# Patient Record
Sex: Male | Born: 1950 | Race: Black or African American | Hispanic: No | Marital: Married | Smoking: Former smoker
Health system: Southern US, Community
[De-identification: ages and names within clinical notes are randomized; demographics above are authoritative.]

## PROBLEM LIST (undated history)

## (undated) DIAGNOSIS — I2510 Atherosclerotic heart disease of native coronary artery without angina pectoris: Secondary | ICD-10-CM

## (undated) DIAGNOSIS — E785 Hyperlipidemia, unspecified: Secondary | ICD-10-CM

## (undated) DIAGNOSIS — G4733 Obstructive sleep apnea (adult) (pediatric): Secondary | ICD-10-CM

## (undated) DIAGNOSIS — I429 Cardiomyopathy, unspecified: Secondary | ICD-10-CM

## (undated) DIAGNOSIS — E119 Type 2 diabetes mellitus without complications: Secondary | ICD-10-CM

## (undated) DIAGNOSIS — N529 Male erectile dysfunction, unspecified: Secondary | ICD-10-CM

## (undated) DIAGNOSIS — N184 Chronic kidney disease, stage 4 (severe): Secondary | ICD-10-CM

## (undated) DIAGNOSIS — I272 Pulmonary hypertension, unspecified: Secondary | ICD-10-CM

## (undated) DIAGNOSIS — M109 Gout, unspecified: Secondary | ICD-10-CM

## (undated) DIAGNOSIS — R0689 Other abnormalities of breathing: Secondary | ICD-10-CM

## (undated) DIAGNOSIS — I10 Essential (primary) hypertension: Secondary | ICD-10-CM

## (undated) HISTORY — DX: Male erectile dysfunction, unspecified: N52.9

## (undated) HISTORY — PX: HAND SURGERY: SHX662

---

## 1999-09-05 ENCOUNTER — Inpatient Hospital Stay (HOSPITAL_COMMUNITY): Admission: EM | Admit: 1999-09-05 | Discharge: 1999-09-09 | Payer: Self-pay | Admitting: Cardiology

## 2003-08-17 ENCOUNTER — Ambulatory Visit (HOSPITAL_COMMUNITY): Admission: RE | Admit: 2003-08-17 | Discharge: 2003-08-18 | Payer: Self-pay | Admitting: Internal Medicine

## 2003-08-17 ENCOUNTER — Encounter: Payer: Self-pay | Admitting: Internal Medicine

## 2004-05-01 ENCOUNTER — Ambulatory Visit: Payer: Self-pay | Admitting: Endocrinology

## 2004-05-13 ENCOUNTER — Ambulatory Visit: Payer: Self-pay | Admitting: Endocrinology

## 2004-09-10 ENCOUNTER — Ambulatory Visit: Payer: Self-pay | Admitting: Endocrinology

## 2004-09-24 ENCOUNTER — Ambulatory Visit: Payer: Self-pay | Admitting: Endocrinology

## 2004-11-24 ENCOUNTER — Ambulatory Visit: Payer: Self-pay | Admitting: Endocrinology

## 2004-12-30 ENCOUNTER — Ambulatory Visit: Payer: Self-pay | Admitting: Endocrinology

## 2005-02-10 ENCOUNTER — Ambulatory Visit: Payer: Self-pay | Admitting: Endocrinology

## 2005-05-13 ENCOUNTER — Ambulatory Visit: Payer: Self-pay | Admitting: Endocrinology

## 2005-06-17 ENCOUNTER — Ambulatory Visit: Payer: Self-pay | Admitting: Endocrinology

## 2005-09-15 ENCOUNTER — Ambulatory Visit: Payer: Self-pay | Admitting: Endocrinology

## 2005-10-15 ENCOUNTER — Ambulatory Visit: Payer: Self-pay | Admitting: Endocrinology

## 2005-12-29 ENCOUNTER — Ambulatory Visit: Payer: Self-pay | Admitting: Endocrinology

## 2006-01-06 ENCOUNTER — Ambulatory Visit: Payer: Self-pay | Admitting: Endocrinology

## 2006-04-08 ENCOUNTER — Ambulatory Visit: Payer: Self-pay | Admitting: Endocrinology

## 2006-07-15 ENCOUNTER — Ambulatory Visit: Payer: Self-pay | Admitting: Endocrinology

## 2006-08-19 ENCOUNTER — Ambulatory Visit (HOSPITAL_COMMUNITY): Admission: RE | Admit: 2006-08-19 | Discharge: 2006-08-19 | Payer: Self-pay | Admitting: Ophthalmology

## 2006-10-20 ENCOUNTER — Ambulatory Visit: Payer: Self-pay | Admitting: Endocrinology

## 2006-12-30 ENCOUNTER — Encounter: Payer: Self-pay | Admitting: Endocrinology

## 2006-12-30 DIAGNOSIS — I10 Essential (primary) hypertension: Secondary | ICD-10-CM

## 2006-12-30 DIAGNOSIS — I2510 Atherosclerotic heart disease of native coronary artery without angina pectoris: Secondary | ICD-10-CM

## 2007-01-12 ENCOUNTER — Ambulatory Visit: Payer: Self-pay | Admitting: Endocrinology

## 2007-02-18 ENCOUNTER — Ambulatory Visit: Payer: Self-pay | Admitting: Cardiology

## 2007-05-10 ENCOUNTER — Encounter: Payer: Self-pay | Admitting: Endocrinology

## 2007-05-12 ENCOUNTER — Ambulatory Visit: Payer: Self-pay | Admitting: Endocrinology

## 2007-05-26 ENCOUNTER — Encounter: Payer: Self-pay | Admitting: Endocrinology

## 2007-08-02 ENCOUNTER — Encounter: Payer: Self-pay | Admitting: Endocrinology

## 2007-09-08 ENCOUNTER — Encounter: Payer: Self-pay | Admitting: Endocrinology

## 2007-09-20 ENCOUNTER — Ambulatory Visit: Payer: Self-pay | Admitting: Endocrinology

## 2007-10-20 ENCOUNTER — Ambulatory Visit: Payer: Self-pay | Admitting: Endocrinology

## 2008-01-02 ENCOUNTER — Ambulatory Visit: Payer: Self-pay | Admitting: Endocrinology

## 2008-01-02 LAB — CONVERTED CEMR LAB: Hgb A1c MFr Bld: 8.6 % — ABNORMAL HIGH (ref 4.6–6.0)

## 2008-04-02 ENCOUNTER — Ambulatory Visit: Payer: Self-pay | Admitting: Endocrinology

## 2008-04-02 LAB — CONVERTED CEMR LAB: Hgb A1c MFr Bld: 8.8 % — ABNORMAL HIGH (ref 4.6–6.0)

## 2008-07-30 ENCOUNTER — Ambulatory Visit: Payer: Self-pay | Admitting: Endocrinology

## 2008-07-30 DIAGNOSIS — F17200 Nicotine dependence, unspecified, uncomplicated: Secondary | ICD-10-CM | POA: Insufficient documentation

## 2008-07-30 DIAGNOSIS — M109 Gout, unspecified: Secondary | ICD-10-CM

## 2008-07-30 DIAGNOSIS — E7800 Pure hypercholesterolemia, unspecified: Secondary | ICD-10-CM

## 2008-08-17 ENCOUNTER — Encounter: Payer: Self-pay | Admitting: Endocrinology

## 2008-09-05 ENCOUNTER — Ambulatory Visit: Payer: Self-pay | Admitting: Endocrinology

## 2008-09-05 DIAGNOSIS — I6789 Other cerebrovascular disease: Secondary | ICD-10-CM

## 2008-09-05 LAB — CONVERTED CEMR LAB: Hgb A1c MFr Bld: 9.6 % — ABNORMAL HIGH (ref 4.6–6.5)

## 2008-10-10 ENCOUNTER — Ambulatory Visit (HOSPITAL_COMMUNITY): Admission: RE | Admit: 2008-10-10 | Discharge: 2008-10-10 | Payer: Self-pay | Admitting: Family Medicine

## 2008-12-10 ENCOUNTER — Ambulatory Visit: Payer: Self-pay | Admitting: Endocrinology

## 2008-12-18 ENCOUNTER — Telehealth (INDEPENDENT_AMBULATORY_CARE_PROVIDER_SITE_OTHER): Payer: Self-pay | Admitting: *Deleted

## 2009-01-04 ENCOUNTER — Ambulatory Visit: Payer: Self-pay | Admitting: Endocrinology

## 2009-02-04 ENCOUNTER — Ambulatory Visit: Payer: Self-pay | Admitting: Endocrinology

## 2009-02-12 ENCOUNTER — Encounter: Payer: Self-pay | Admitting: Endocrinology

## 2009-05-14 ENCOUNTER — Ambulatory Visit: Payer: Self-pay | Admitting: Endocrinology

## 2009-06-12 ENCOUNTER — Encounter: Payer: Self-pay | Admitting: Endocrinology

## 2009-08-13 ENCOUNTER — Ambulatory Visit: Payer: Self-pay | Admitting: Endocrinology

## 2009-08-13 LAB — CONVERTED CEMR LAB: Hgb A1c MFr Bld: 10.1 % — ABNORMAL HIGH (ref 4.6–6.5)

## 2009-08-20 ENCOUNTER — Telehealth (INDEPENDENT_AMBULATORY_CARE_PROVIDER_SITE_OTHER): Payer: Self-pay | Admitting: *Deleted

## 2009-10-22 ENCOUNTER — Encounter: Payer: Self-pay | Admitting: Endocrinology

## 2009-10-28 ENCOUNTER — Telehealth (INDEPENDENT_AMBULATORY_CARE_PROVIDER_SITE_OTHER): Payer: Self-pay | Admitting: *Deleted

## 2009-11-11 ENCOUNTER — Ambulatory Visit: Payer: Self-pay | Admitting: Endocrinology

## 2009-12-11 ENCOUNTER — Ambulatory Visit: Payer: Self-pay | Admitting: Endocrinology

## 2009-12-11 LAB — CONVERTED CEMR LAB: Hgb A1c MFr Bld: 7.6 % — ABNORMAL HIGH (ref 4.6–6.5)

## 2009-12-19 ENCOUNTER — Emergency Department (HOSPITAL_COMMUNITY): Admission: EM | Admit: 2009-12-19 | Discharge: 2009-12-19 | Payer: Self-pay | Admitting: Emergency Medicine

## 2010-02-18 ENCOUNTER — Encounter: Payer: Self-pay | Admitting: Endocrinology

## 2010-03-21 ENCOUNTER — Ambulatory Visit: Payer: Self-pay | Admitting: Endocrinology

## 2010-07-22 NOTE — Assessment & Plan Note (Signed)
Summary: 3 MO ROV /NWS   Vital Signs:  Patient profile:   60 year old male Height:      71 inches (180.34 cm) Weight:      239 pounds (108.64 kg) BMI:     33.45 O2 Sat:      91 % on Room air Temp:     98.3 degrees F (36.83 degrees C) oral Pulse rate:   78 / minute BP sitting:   136 / 76  (left arm) Cuff size:   large  Vitals Entered By: Rebeca Alert MA (March 21, 2010 9:28 AM)  O2 Flow:  Room air CC: 3 month F/U/aj Is Patient Diabetic? Yes   Primary [place] Clos:  Dr Quintin Alto  CC:  3 month F/U/aj.  History of Present Illness: pt states he feels well in general, except for a few aches and pains.  no cbg record, but states cbg's vary from 80-185, with no trend throughout the day.  Current Medications (verified): 1)  Metformin Hcl 850 Mg  Tabs (Metformin Hcl) .... Take 1 By Mouth Bid 2)  Enalapril Maleate 20 Mg  Tabs (Enalapril Maleate) .... Tale 1 By Mouth Qd 3)  Hydrochlorothiazide 25 Mg  Tabs (Hydrochlorothiazide) .... Take 1/2 Tab By Mouth Qd 4)  Alprazolam 0.25 Mg  Tabs (Alprazolam) .... Take 1-2 By Mouth Once Daily Prn 5)  Simvastatin 20 Mg  Tabs (Simvastatin) .... Take 1 By Mouth Qhs 6)  Plavix 75 Mg  Tabs (Clopidogrel Bisulfate) .... Take 1 By Mouth Qd 7)  Lopressor 50 Mg  Tabs (Metoprolol Tartrate) .... Take 1 By Mouth Two Times A Day Qd 8)  Levemir 100 Unit/ml Soln (Insulin Detemir) .... 75 Units Qam and 45 Units Pm, and Synringes Two Times A Day 9)  Aspirin 325 Mg Tabs (Aspirin) .Marland Kitchen.. 1 Tab Two Times A Day 10)  Toprol Xl 50 Mg Xr24h-Tab (Metoprolol Succinate) .Marland Kitchen.. 1 Tablet By Mouth Once Daily  Allergies (verified): No Known Drug Allergies  Past History:  Past Medical History: Last updated: 09/05/2008 ED DM Neuropathy CEREBROVASCULAR ACCIDENT, ACUTE (ICD-436) SMOKER (ICD-305.1) HYPERCHOLESTEROLEMIA (ICD-272.0) GOUT (ICD-274.9) CORONARY ARTERY DISEASE (ICD-414.00) HYPERTENSION (ICD-401.9) DIABETES MELLITUS, TYPE II (ICD-250.00)  Review of Systems  The  patient denies syncope.    Physical Exam  General:  normal appearance.   Pulses:  dorsalis pedis intact bilat.    Extremities:  no deformity.  no ulcer on the feet.  feet are of normal color and temp.  no edema  Neurologic:  sensation is intact to touch on the feet. Additional Exam:  outside test results are reviewed:  a1c=7.6   Impression & Recommendations:  Problem # 1:  DIABETES MELLITUS, TYPE II (ICD-250.00) this is the best control this pt should aim for, given this regimen, which does match insulin to his changing needs throughout the day  Medications Added to Medication List This Visit: 1)  Toprol Xl 50 Mg Xr24h-tab (Metoprolol succinate) .Marland Kitchen.. 1 tablet by mouth once daily  Other Orders: Est. Patient Level III (76283)  Patient Instructions: 1)  please continue levemir 75 units am and 45 units pm.  2)  Please schedule a follow-up appointment in 4 months. 3)  good diet and exercise habits significanly improve the control of your diabetes.  please let me know if you wish to be referred to a dietician.  high blood sugar is very risky to your health.  you should see an eye doctor every year. 4)  controlling your blood pressure and cholesterol drastically reduces the  damage diabetes does to your body.  this also applies to quitting smoking.  please discuss these with your doctor.  you should take an aspirin every day, unless you have been advised by a doctor not to.

## 2010-07-22 NOTE — Progress Notes (Signed)
  Recieved request from [facility] Forwarded to [facility] for processing [facility]  August 20, 2009 9:32 AM

## 2010-07-22 NOTE — Assessment & Plan Note (Signed)
Summary: 1 mth fu--stc   Vital Signs:  Patient profile:   60 year old male Height:      71 inches (180.34 cm) Weight:      234.4 pounds (106.55 kg) O2 Sat:      92 % on Room air Temp:     97.4 degrees F (36.33 degrees C) oral Pulse rate:   68 / minute BP sitting:   132 / 82  (left arm) Cuff size:   large  Vitals Entered By: Tomma Lightning (December 11, 2009 10:05 AM)  O2 Flow:  Room air CC: 1 month follow-up Is Patient Diabetic? Yes Did you bring your meter with you today? No Pain Assessment Patient in pain? no        Primary Provider:  Dr Quintin Alto  CC:  1 month follow-up.  History of Present Illness: he brings a record of his cbg's which i have reviewed today.  it varies from 61 (before lunch or before supper) to 200 (before breakfast).  pt states he feels well in general.  he takes levemir 80 units am and 40 units pm.    Current Medications (verified): 1)  Metformin Hcl 850 Mg  Tabs (Metformin Hcl) .... Take 1 By Mouth Bid 2)  Enalapril Maleate 20 Mg  Tabs (Enalapril Maleate) .... Tale 1 By Mouth Qd 3)  Hydrochlorothiazide 25 Mg  Tabs (Hydrochlorothiazide) .... Take 1/2 Tab By Mouth Qd 4)  Alprazolam 0.25 Mg  Tabs (Alprazolam) .... Take 1-2 By Mouth Once Daily Prn 5)  Simvastatin 20 Mg  Tabs (Simvastatin) .... Take 1 By Mouth Qhs 6)  Plavix 75 Mg  Tabs (Clopidogrel Bisulfate) .... Take 1 By Mouth Qd 7)  Lopressor 50 Mg  Tabs (Metoprolol Tartrate) .... Take 1 By Mouth Two Times A Day Qd 8)  Levemir 100 Unit/ml Soln (Insulin Detemir) .... 75 Units Qam and 45 Units Pm, and Synringes Two Times A Day 9)  Aspirin 325 Mg Tabs (Aspirin) .Marland Kitchen.. 1 Tab Two Times A Day  Allergies (verified): No Known Drug Allergies  Past History:  Past Medical History: Last updated: 09/05/2008 ED DM Neuropathy CEREBROVASCULAR ACCIDENT, ACUTE (ICD-436) SMOKER (ICD-305.1) HYPERCHOLESTEROLEMIA (ICD-272.0) GOUT (ICD-274.9) CORONARY ARTERY DISEASE (ICD-414.00) HYPERTENSION (ICD-401.9) DIABETES  MELLITUS, TYPE II (ICD-250.00)  Review of Systems  The patient denies syncope.    Physical Exam  General:  obese.  no distress  Skin:  insulin injection sites at anterior abdomen are normal  Additional Exam:  Hemoglobin A1C       [H]  7.6 %    Impression & Recommendations:  Problem # 1:  DIABETES MELLITUS, TYPE II (ICD-250.00) improved  Other Orders: TLB-A1C / Hgb A1C (Glycohemoglobin) (83036-A1C) Est. Patient Level III (49675)  Patient Instructions: 1)  tests are being ordered for you today.  a few days after the test(s), please call [phone] to hear your test results. 2)  pending the test results, please change levemir to 75 units am and 45 units pm.  3)  Please schedule a follow-up appointment in 3 months. 4)  (update: i left message on phone-tree:  rx as we discussed.  call if hypoglycemia persists).

## 2010-07-22 NOTE — Letter (Signed)
Summary: [facility]   Imported By: Phillis Knack 06/27/2009 07:41:24  _____________________________________________________________________  External Attachment:    Type:   Image     Comment:   External Document

## 2010-07-22 NOTE — Assessment & Plan Note (Signed)
Summary: 3 MTH FU  STC   Vital Signs:  Patient profile:   60 year old male Height:      71 inches (180.34 cm) Weight:      237.38 pounds (107.90 kg) BMI:     33.23 O2 Sat:      84 % on Room air Temp:     97.4 degrees F (36.33 degrees C) oral Pulse rate:   78 / minute BP sitting:   138 / 80  (left arm) Cuff size:   large  Vitals Entered By: Gardenia Phlegm RMA (Nov 11, 2009 10:33 AM)  O2 Flow:  Room air CC: 3 month follow up/  OX 94 on 2nd retake/ CF Is Patient Diabetic? Yes   Primary Provider:  Dr Quintin Alto  CC:  3 month follow up/  OX 94 on 2nd retake/ CF.  History of Present Illness: pt states he feels well in general.  he brings a record of his cbg's which i have reviewed today.  i received a note from dr sasser that his a1c was high, so i called pt to increase his levemir.  he was advised to increase to 90 units am and 40 units pm.  however, he says he just then increased to 80 am and 40 pm (which is the dosage i thought he was on already).  cbg's are much better since then.  it varies from 60's (before lunch) to high-100's (am).   pt says it has been for many years that his 02 sat drops with ambulation.  Current Medications (verified): 1)  Metformin Hcl 850 Mg  Tabs (Metformin Hcl) .... Take 1 By Mouth Bid 2)  Enalapril Maleate 20 Mg  Tabs (Enalapril Maleate) .... Tale 1 By Mouth Qd 3)  Hydrochlorothiazide 25 Mg  Tabs (Hydrochlorothiazide) .... Take 1/2 Tab By Mouth Qd 4)  Alprazolam 0.25 Mg  Tabs (Alprazolam) .... Take 1-2 By Mouth Once Daily Prn 5)  Simvastatin 20 Mg  Tabs (Simvastatin) .... Take 1 By Mouth Qhs 6)  Plavix 75 Mg  Tabs (Clopidogrel Bisulfate) .... Take 1 By Mouth Qd 7)  Lopressor 50 Mg  Tabs (Metoprolol Tartrate) .... Take 1 By Mouth Two Times A Day Qd 8)  Insulin Pen Needles, and Size and Brand .... Tid 9)  Levemir 100 Unit/ml Soln (Insulin Detemir) .... 80 Units Qam and 40 Units Pm 10)  Aspirin 325 Mg Tabs (Aspirin) .Marland Kitchen.. 1 Tab Two Times A Day  Allergies  (verified): No Known Drug Allergies  Past History:  Past Medical History: Last updated: 09/05/2008 ED DM Neuropathy CEREBROVASCULAR ACCIDENT, ACUTE (ICD-436) SMOKER (ICD-305.1) HYPERCHOLESTEROLEMIA (ICD-272.0) GOUT (ICD-274.9) CORONARY ARTERY DISEASE (ICD-414.00) HYPERTENSION (ICD-401.9) DIABETES MELLITUS, TYPE II (ICD-250.00)  Review of Systems  The patient denies syncope.    Physical Exam  General:  morbidly obese.  no distress  Lungs:  Clear to auscultation bilaterally. Normal respiratory effort.  Skin:  insulin injection sites at anterior abdomen are normal    Impression & Recommendations:  Problem # 1:  DIABETES MELLITUS, TYPE II (ICD-250.00) needs increased rx  Problem # 2:  hypoxia uncertain etiology  Medications Added to Medication List This Visit: 1)  Levemir 100 Unit/ml Soln (Insulin detemir) .... 75 units qam and 45 units pm, and synringes two times a day  Other Orders: Est. Patient Level III (66294)  Patient Instructions: 1)  tests are being ordered for you today.  a few days after the test(s), please call [phone] to hear your test results. 2)  pending  the test results, please continue the same insulin for now. 3)  Please schedule a follow-up appointment in 1 month. 4)  change levemir to 75 units am and 45 units pm.  5)  please see dr Quintin Alto to see why your oxygen goes low when you walk.

## 2010-07-22 NOTE — Progress Notes (Signed)
  Phone Note Outgoing Call   Summary of Call: Left a message for pt to return my call. MD wants pt to increase levimir to 90 units in AM and 40 units pm. return as scheduled. Initial call taken by: Gardenia Phlegm RMA,  Oct 28, 2009 11:43 AM  Follow-up for Phone Call        Informed pt Follow-up by: Gardenia Phlegm RMA,  Oct 28, 2009 2:15 PM

## 2010-07-22 NOTE — Assessment & Plan Note (Signed)
Summary: 3 MTH FU  STC   Vital Signs:  Patient profile:   60 year old male Height:      71 inches (180.34 cm) Weight:      243 pounds (110.45 kg) O2 Sat:      90 % on Room air Temp:     97.1 degrees F (36.17 degrees C) oral Pulse rate:   79 / minute BP sitting:   142 / 82  (left arm) Cuff size:   large  Vitals Entered By: Gardenia Phlegm RMA (August 13, 2009 9:50 AM)  O2 Flow:  Room air CC: 3 month follow up/ Pt states every morning his BS is in the 200's/ CF Is Patient Diabetic? Yes   Primary Provider:  Dr Quintin Alto  CC:  3 month follow up/ Pt states every morning his BS is in the 200's/ CF.  History of Present Illness: pt states he feels well in general.  he brings a record of his cbg's which i have reviewed today.  it varies from 60-250.  it is highest in am, and lowest before lunch, and in the afternoon.  Current Medications (verified): 1)  Metformin Hcl 850 Mg  Tabs (Metformin Hcl) .... Take 1 By Mouth Bid 2)  Enalapril Maleate 20 Mg  Tabs (Enalapril Maleate) .... Tale 1 By Mouth Qd 3)  Hydrochlorothiazide 25 Mg  Tabs (Hydrochlorothiazide) .... Take 1/2 Tab By Mouth Qd 4)  Alprazolam 0.25 Mg  Tabs (Alprazolam) .... Take 1-2 By Mouth Once Daily Prn 5)  Simvastatin 20 Mg  Tabs (Simvastatin) .... Take 1 By Mouth Qhs 6)  Plavix 75 Mg  Tabs (Clopidogrel Bisulfate) .... Take 1 By Mouth Qd 7)  Lopressor 50 Mg  Tabs (Metoprolol Tartrate) .... Take 1 By Mouth Two Times A Day Qd 8)  Insulin Pen Needles, and Size and Brand .... Tid 9)  Levemir 100 Unit/ml Soln (Insulin Detemir) .... 80 Units Qam and 30 Units Pm 10)  Aspirin 325 Mg Tabs (Aspirin) .Marland Kitchen.. 1 Tab Two Times A Day  Allergies (verified): No Known Drug Allergies  Past History:  Past Medical History: Last updated: 09/05/2008 ED DM Neuropathy CEREBROVASCULAR ACCIDENT, ACUTE (ICD-436) SMOKER (ICD-305.1) HYPERCHOLESTEROLEMIA (ICD-272.0) GOUT (ICD-274.9) CORONARY ARTERY DISEASE (ICD-414.00) HYPERTENSION  (ICD-401.9) DIABETES MELLITUS, TYPE II (ICD-250.00)  Review of Systems  The patient denies syncope.    Physical Exam  General:  obese.  no distress  Pulses:  dorsalis pedis intact bilat.    Extremities:  no deformity.  no ulcer on the feet.  feet are of normal color and temp.  no edema  Neurologic:  sensation is intact to touch on the feet  Additional Exam:  (a1c was recently 9.3 by dr sasser).  today: Hemoglobin A1C       [H]  10.1 %   Impression & Recommendations:  Problem # 1:  DIABETES MELLITUS, TYPE II (ICD-250.00) therapy limited by pt's need for a simple regimen.  he may do better with a simpler (once daily) regimen  Medications Added to Medication List This Visit: 1)  Levemir 100 Unit/ml Soln (Insulin detemir) .... 80 units qam and 40 units pm  Other Orders: TLB-A1C / Hgb A1C (Glycohemoglobin) (83036-A1C) Est. Patient Level III (09735)  Patient Instructions: 1)  tests are being ordered for you today.  a few days after the test(s), please call [phone] to hear your test results. 2)  pending the test results, please continue the same insulin for now. 3)  Please schedule a follow-up appointment in 3  months. 4)  please cosider weight loss surgery.  i am happy to refer you if you wish. 5)  (update: i left message on phone-tree:  increase levemir to 80 units am and 40 units pm.  please consider lantus).

## 2010-07-22 NOTE — Letter (Signed)
Summary: [facility]   Imported By: Phillis Knack 10/30/2009 13:17:25  _____________________________________________________________________  External Attachment:    Type:   Image     Comment:   External Document

## 2010-07-23 ENCOUNTER — Ambulatory Visit: Admit: 2010-07-23 | Payer: Self-pay | Admitting: Endocrinology

## 2010-07-23 ENCOUNTER — Ambulatory Visit: Payer: Self-pay | Admitting: Endocrinology

## 2010-08-04 ENCOUNTER — Encounter: Payer: Self-pay | Admitting: Endocrinology

## 2010-08-04 ENCOUNTER — Ambulatory Visit (INDEPENDENT_AMBULATORY_CARE_PROVIDER_SITE_OTHER): Payer: Managed Care, Other (non HMO) | Admitting: Endocrinology

## 2010-08-04 ENCOUNTER — Other Ambulatory Visit: Payer: Managed Care, Other (non HMO)

## 2010-08-04 ENCOUNTER — Encounter (INDEPENDENT_AMBULATORY_CARE_PROVIDER_SITE_OTHER): Payer: Self-pay | Admitting: *Deleted

## 2010-08-04 ENCOUNTER — Other Ambulatory Visit: Payer: Self-pay | Admitting: Endocrinology

## 2010-08-04 DIAGNOSIS — E119 Type 2 diabetes mellitus without complications: Secondary | ICD-10-CM

## 2010-08-04 LAB — HEMOGLOBIN A1C: Hgb A1c MFr Bld: 9.8 % — ABNORMAL HIGH (ref 4.6–6.5)

## 2010-08-13 NOTE — Assessment & Plan Note (Signed)
Summary: 4 MO ROV/NWS #   Vital Signs:  Patient profile:   60 year old male Height:      71 inches (180.34 cm) Weight:      240.50 pounds (109.32 kg) BMI:     33.66 O2 Sat:      87 % on Room air Temp:     98.6 degrees F (37.00 degrees C) oral Pulse rate:   86 / minute Pulse rhythm:   regular BP sitting:   128 / 76  (left arm) Cuff size:   large  Vitals Entered By: Rebeca Alert CMA Deborra Medina) (August 04, 2010 10:17 AM)  O2 Flow:  Room air CC: 4 month F/U/CBG's have been high in the mornings/aj Is Patient Diabetic? Yes Comments pt has never had a colonoscopy and pt declines flu shot   Primary Provider:  Dr Quintin Alto  CC:  4 month F/U/CBG's have been high in the mornings/aj.  History of Present Illness: no cbg record, but states cbg's are well-controlled, except it is in the 200's in am.  it sometimes is a slow as 70 in the afternoon.    Current Medications (verified): 1)  Metformin Hcl 850 Mg  Tabs (Metformin Hcl) .... Take 1 By Mouth Bid 2)  Enalapril Maleate 20 Mg  Tabs (Enalapril Maleate) .... Tale 1 By Mouth Qd 3)  Hydrochlorothiazide 25 Mg  Tabs (Hydrochlorothiazide) .... Take 1/2 Tab By Mouth Qd 4)  Alprazolam 0.25 Mg  Tabs (Alprazolam) .... Take 1-2 By Mouth Once Daily Prn 5)  Simvastatin 20 Mg  Tabs (Simvastatin) .... Take 1 By Mouth Qhs 6)  Plavix 75 Mg  Tabs (Clopidogrel Bisulfate) .... Take 1 By Mouth Qd 7)  Lopressor 50 Mg  Tabs (Metoprolol Tartrate) .... Take 1 By Mouth Two Times A Day Qd 8)  Levemir 100 Unit/ml Soln (Insulin Detemir) .... 80 Units Qam and 40 Units Pm, and Synringes Two Times A Day 9)  Aspirin 325 Mg Tabs (Aspirin) .Marland Kitchen.. 1 Tab Two Times A Day 10)  Toprol Xl 50 Mg Xr24h-Tab (Metoprolol Succinate) .Marland Kitchen.. 1 Tablet By Mouth Once Daily  Allergies (verified): No Known Drug Allergies  Past History:  Past Medical History: Last updated: 09/05/2008 ED DM Neuropathy CEREBROVASCULAR ACCIDENT, ACUTE (ICD-436) SMOKER (ICD-305.1) HYPERCHOLESTEROLEMIA  (ICD-272.0) GOUT (ICD-274.9) CORONARY ARTERY DISEASE (ICD-414.00) HYPERTENSION (ICD-401.9) DIABETES MELLITUS, TYPE II (ICD-250.00)  Family History: Reviewed history and no changes required. dm: father  Social History: Reviewed history and no changes required. married disabled.    Review of Systems  The patient denies syncope.    Physical Exam  General:  normal appearance.   Neck:  Supple without thyroid enlargement or tenderness.  Additional Exam:  Hemoglobin A1C       [H]  9.8 %      Impression & Recommendations:  Problem # 1:  DIABETES MELLITUS, TYPE II (ICD-250.00) Assessment Deteriorated needs increased rx  Medications Added to Medication List This Visit: 1)  Levemir 100 Unit/ml Soln (Insulin detemir) .... 80 units qam and 40 units pm, and synringes two times a day 2)  Levemir 100 Unit/ml Soln (Insulin detemir) .... 70 units qam and 60 units pm, and synringes two times a day  Other Orders: TLB-A1C / Hgb A1C (Glycohemoglobin) (83036-A1C) Est. Patient Level III (40981)  Patient Instructions: 1)  blood tests are being ordered for you today.  please call [phone] to hear your test results.   2)  pending the test results, please change levemir to 70 units each am, and 60  units each evening.   3)  Please schedule a follow-up appointment in 6 weeks.   4)  (update: i left message on phone-tree:  rx as we discussed)   Orders Added: 1)  TLB-A1C / Hgb A1C (Glycohemoglobin) [83036-A1C] 2)  Est. Patient Level III [03014]   Immunization History:  Tetanus/Td Immunization History:    Tetanus/Td:  historical (06/22/2008)   Immunization History:  Tetanus/Td Immunization History:    Tetanus/Td:  Historical (06/22/2008)

## 2010-09-15 ENCOUNTER — Ambulatory Visit (INDEPENDENT_AMBULATORY_CARE_PROVIDER_SITE_OTHER): Payer: Managed Care, Other (non HMO) | Admitting: Endocrinology

## 2010-09-15 ENCOUNTER — Encounter: Payer: Self-pay | Admitting: Endocrinology

## 2010-09-15 VITALS — BP 112/68 | HR 73 | Temp 98.1°F | Ht 71.0 in | Wt 243.4 lb

## 2010-09-15 DIAGNOSIS — E119 Type 2 diabetes mellitus without complications: Secondary | ICD-10-CM

## 2010-09-15 MED ORDER — INSULIN DETEMIR 100 UNIT/ML ~~LOC~~ SOLN: 80.0000 [IU] | Freq: Two times a day (BID) | SUBCUTANEOUS | Status: DC

## 2010-09-15 NOTE — Progress Notes (Signed)
  Subjective:    Patient ID: Gabriel Alvarez, male    DOB: May 25, 1951, 60 y.o.   MRN: [id number]  HPI Pt return for f/u of dm.  He says he has frequent mild hypoglycemia before lunch, if he is active.  he brings a record of his cbg's which i have reviewed today.  It varies from 60's (afternoon), to mostly high-100's (am).  Past Medical History  Diagnosis Date  . ED (erectile dysfunction)   . DM neuropathy, type II diabetes mellitus   . DIABETES MELLITUS, TYPE II 12/30/2006  . HYPERCHOLESTEROLEMIA 07/30/2008  . GOUT 07/30/2008  . HYPERTENSION 12/30/2006  . CORONARY ARTERY DISEASE 12/30/2006  . CEREBROVASCULAR ACCIDENT, ACUTE 09/05/2008   No past surgical history on file.  reports that he has been smoking Cigarettes.  He has a 30 pack-year smoking history. He does not have any smokeless tobacco history on file. He reports that he does not drink alcohol or use illicit drugs. family history includes Diabetes in his father. No Known Allergies Review of Systems Denies loc    Objective:   Physical Exam Pulses: dorsalis pedis intact bilat.   Feet: no deformity.  no ulcer on the feet.  feet are of normal color and temp.  no edema Neuro: sensation is intact to touch on the feet          Assessment & Plan:  Dm, needs increased rx

## 2010-09-15 NOTE — Patient Instructions (Addendum)
Take levemir 80 units 2x a day.  However, if you are going to be active during the day, take just 70 units that morning. please return here in 6 weeks.

## 2010-10-27 ENCOUNTER — Ambulatory Visit: Payer: Managed Care, Other (non HMO) | Admitting: Endocrinology

## 2010-10-30 ENCOUNTER — Encounter: Payer: Self-pay | Admitting: Endocrinology

## 2010-10-30 ENCOUNTER — Ambulatory Visit (INDEPENDENT_AMBULATORY_CARE_PROVIDER_SITE_OTHER): Payer: Managed Care, Other (non HMO) | Admitting: Endocrinology

## 2010-10-30 DIAGNOSIS — E119 Type 2 diabetes mellitus without complications: Secondary | ICD-10-CM

## 2010-10-30 MED ORDER — INSULIN GLARGINE 100 UNIT/ML ~~LOC~~ SOLN: 150.0000 [IU] | Freq: Every day | SUBCUTANEOUS | Status: DC

## 2010-10-30 NOTE — Progress Notes (Signed)
  Subjective:    Patient ID: Gabriel Alvarez, male    DOB: 28-Dec-1950, 60 y.o.   MRN: [id number]  HPI pt states he feels well in general.  He says he never misses insulin injections.  he brings a record of his cbg's which i have reviewed today.  It varies from 61-247, but most are in the 100's.  It is lowest before lunch, and in the afternoon.  He says numbness of the feet is much better recently. Past Medical History  Diagnosis Date  . ED (erectile dysfunction)   . DM neuropathy, type II diabetes mellitus   . DIABETES MELLITUS, TYPE II 12/30/2006  . HYPERCHOLESTEROLEMIA 07/30/2008  . GOUT 07/30/2008  . HYPERTENSION 12/30/2006  . CORONARY ARTERY DISEASE 12/30/2006  . CEREBROVASCULAR ACCIDENT, ACUTE 09/05/2008    No past surgical history on file.  History   Social History  . Marital Status: Married    Spouse Name: N/A    Number of Children: N/A  . Years of Education: N/A   Occupational History  .      disabled   Social History Main Topics  . Smoking status: Current Everyday Smoker -- 1.0 packs/day for 30 years    Types: Cigarettes  . Smokeless tobacco: Not on file  . Alcohol Use: No  . Drug Use: No  . Sexually Active: Not on file   Other Topics Concern  . Not on file   Social History Narrative  . No narrative on file    Current Outpatient Prescriptions on File Prior to Visit  Medication Sig Dispense Refill  . aspirin 325 MG tablet Take 325 mg by mouth 2 (two) times daily.        . clopidogrel (PLAVIX) 75 MG tablet Take 75 mg by mouth daily.        . enalapril (VASOTEC) 20 MG tablet Take 20 mg by mouth daily.        . hydrochlorothiazide 25 MG tablet 1 tablet by mouth once daily      . metFORMIN (GLUCOPHAGE) 850 MG tablet Take 850 mg by mouth 2 (two) times daily with a meal.        . metoprolol (TOPROL-XL) 50 MG 24 hr tablet Take 50 mg by mouth daily.        . metoprolol (LOPRESSOR) 50 MG tablet Take 50 mg by mouth 2 (two) times daily.          No Known Allergies  Family  History  Problem Relation Age of Onset  . Diabetes Father     BP 128/80  Pulse 70  Temp(Src) 98.6 F (37 C) (Oral)  Ht _0  (1.803 m)  Wt 247 lb 6.4 oz (112.22 kg)  BMI 34.51 kg/m2  SpO2 89%    Review of Systems Denies loc.    Objective:   Physical Exam     Pt says a1c was 9.8, last week.   Assessment & Plan:  Dm.  It seems worth while to try a different type of insulin schedule.

## 2010-10-30 NOTE — Patient Instructions (Signed)
Change levemir to lantus 150 units each morning. Please make a follow-up appointment in 6 weeks.

## 2010-11-04 NOTE — Consult Note (Signed)
[facility]                          ENDOCRINOLOGY CONSULTATION   Gabriel Alvarez, Gabriel Alvarez                        MRN:          [id number]  DATE:01/12/2007                            DOB:          1950/10/02    REASON FOR VISIT:  Follow up diabetes.   HISTORY OF THE PRESENT ILLNESS:  A 60 year old man who currently takes  Lantus 45 units twice a day, because he appeared to need a simpler  regimen.  He is having frequent hypoglycemia before lunch (about 3 times  a week).  I advised him to consider bariatric surgery, but he states he  investigated this and found his insurance would not pay for it.   PAST MEDICAL HISTORY:  1. Diabetes nephropathy.  2. Hypertension.  3. ED.  4. Dyslipidemia.   REVIEW OF SYSTEMS:  Denies loss of consciousness.   PHYSICAL EXAMINATION:  Blood pressure 145/77, heart rate is 82,  temperature is 97.3, the weight is 263.  GENERAL:  No distress.  FEET:  Normal color and temperature.  There is no ulcer present on the  feet.  Dorsalis pedis pulses are intact bilaterally and there is no  edema.  Sensation is intact to touch on the feet.   LABORATORY STUDIES FORWARDED BY DR. Quintin Alto:  Hemoglobin A1c 8.6.   IMPRESSION:  Given his elevated A1c, he his frequent hypoglycemia, and  his need for a simple insulin schedule, I think this probably is the  best control this patient can achieve.   PLAN:  1. Because of the hypoglycemia, I have advised him to reduce his      Lantus to 42 units twice a day.  2. Return in about 4 months.  3. Please make an appointment with Dr. Quintin Alto as you may need more      blood pressure medication.     Sean A. Loanne Drilling, MD  Electronically Signed    SAE/MedQ  DD: 01/14/2007  DT: 01/16/2007  Job #: [id number]   cc:   Gabriel Alvarez

## 2010-11-05 ENCOUNTER — Encounter: Payer: Self-pay | Admitting: Endocrinology

## 2010-11-07 NOTE — Consult Note (Signed)
[facility]                            ENDOCRINOLOGY CONSULTATION   ZENITH, LAMPHIER                        MRN:          [id number]  DATE:12/30/2005                            DOB:          January 14, 1951    REASON FOR VISIT:  Follow up diabetes.   HISTORY OF PRESENT ILLNESS:  A 60 year old man who states he is sometimes  missing his insulin injections. He states his glucoses are extremely  variable. He describes his exercise regimen as good. He describes his diet  as fair.   PAST MEDICAL HISTORY:  1.  Dyslipidemia.  2.  Hypertension.  3.  Diabetes nephropathy.   REVIEW OF SYSTEMS:  He has occasional mild hypoglycemia, at any time of day.   PHYSICAL EXAMINATION:  VITAL SIGNS:  Blood pressure 147/84, heart rate 67,  temperature 97.5. The weight is 258.  FEET:  Normal color and temperature. There is no ulcer present on the feet.  Dorsalis pedis pulses are intact bilaterally. Sensation is intact to touch.  There is no edema.   LABORATORY DATA:  On December 10, 2005, hemoglobin A1c 9.9.   IMPRESSION:  The insulin regimen that I have prescribed for him appears to  be too complex for him.   PLAN:  1.  Change to Humalog 75/25 50 units q.a.m. and 20 units q.p.m.  2.  Return next week.                                   Sean A. Loanne Drilling, MD   Early  DD:  12/30/2005  DT:  12/30/2005  Job #:  [id number]   cc:   Consuello Masse

## 2010-11-07 NOTE — Consult Note (Signed)
[facility]                          ENDOCRINOLOGY CONSULTATION   CADE, DASHNER                        MRN:          [id number]  DATE:10/20/2006                            DOB:          Aug 11, 1950    REASON FOR VISIT:  Follow up diabetes.   HISTORY OF PRESENT ILLNESS:  A 60 year old man who is currently taking  Lantus 75 units every morning.  His glucoses in the morning run 150 to  250 and then in the afternoon, they run 77-150.  He states that he feels  better in general.  Patient is also discouraged about his chronic weight  gain.   PAST MEDICAL HISTORY:  He has never been in a special weight loss  program.  I prescribed him Xenical several years ago, but he did not  take it because it was so expensive.   REVIEW OF SYSTEMS:  Denies hypoglycemia.   PHYSICAL EXAMINATION:  VITAL SIGNS:  Blood pressure 159/77, heart rate  65, temperature 97.2.  The weight is 255.  The height is 5 feet 11  inches.  GENERAL:  No distress.  SKIN:  Insulin injection sites at the anterior abdomen are normal.   LABORATORY STUDIES:  On October 20, 2006, hemoglobin A1C 8.7.   IMPRESSION:  1. He needs an increase in his insulin.  2. Chronic weight gain.   PLAN:  1. Change Lantus to 45 units twice daily.  2. Return in three months.  3. Refer to a bariatric surgeon at his request.     Sean A. Loanne Drilling, MD  Electronically Signed    SAE/MedQ  DD: 10/20/2006  DT: 10/21/2006  Job #: [id number]   cc:   Consuello Masse

## 2010-11-07 NOTE — Cardiovascular Report (Signed)
NAME:  Gabriel Alvarez, Gabriel Alvarez                           ACCOUNT NO.:  [id number]   MEDICAL RECORD NO.:  [id number]                   PATIENT TYPE:  OIB   LOCATION:  5725                                 FACILITY:  [facility]   PHYSICIAN:  Junious Silk, M.D. [facility]         DATE OF BIRTH:  1950/07/20   DATE OF PROCEDURE:  08/17/2003  DATE OF DISCHARGE:  08/18/2003                              CARDIAC CATHETERIZATION   PROCEDURE PERFORMED:  Left heart catheterization with coronary angiography  and left ventriculography.   INDICATION:  Mr. Barmore is a 60 year old male with history of coronary artery  disease and previous stent to the mid right coronary artery.  His last  catheterization performed in 2001 revealed a diffuse 50% in-stent restenosis  in the mid right coronary artery.  He was managed medically at that time. He  presented today to the office in [place] with symptoms of progressive  chest pain over the past week.  He was therefore referred for cardiac  catheterization.   CATHETERIZATION PROCEDURAL NOTE:  A 6 French sheath was placed in the right  femoral artery.  Coronary angiography was performed with 6 [place] JL-4 and  JR-4 catheters.  Intracoronary nitroglycerin was administered in the right  coronary artery to relieve coronary vasospasm.  Contrast was Omnipaque.  There were no complications.   CATHETERIZATION RESULTS:   HEMODYNAMICS:  1. Left ventricular pressure 130/12.  2. Aortic pressure 120/74.  3. There is no aortic valve gradient.   LEFT VENTRICULOGRAM:  Wall motion is normal.  Ejection fraction estimated at  60%.  There is no mitral regurgitation.   CORONARY ARTERIOGRAPHY (CO-DOMINANT):  Left main is normal.   Left anterior descending artery has a tubular 30% stenosis in the mid  vessel.  The distal LAD has a discrete 40% stenosis.  The LAD gives rise to  a normal size first diagonal branch and a small second diagonal branch.  There is a 50% stenosis in the ostium of  the first diagonal branch.   Left circumflex is a large co-dominant vessel.  The circumflex gives rise to  a large first marginal branch, a small second marginal branch and two normal  size posterior lateral branches.  The first obtuse marginal branch has a 40%  stenosis proximally and a 70% stenosis in the very distal portion of this  branch.   Right coronary artery is a co-dominant vessel.  There is a 30% stenosis in  the proximal vessel.  In the mid to distal vessel, there is a long stent  which has a diffuse 50% in-stent restenosis.  This is unchanged compared to  previous catheterization.  There is an acute marginal branch which arises  from within the stented portion of the right coronary artery.  There is an  80% stenosis in the ostium of this acute marginal branch.  The distal right  coronary artery ends as a normal size posterior descending artery.  There is  a 50% stenosis in the proximal portion of the posterior descending artery.   IMPRESSION:  1. Normal left ventricular systolic function.  2. Moderate one-vessel coronary artery disease characterized by diffuse 50%     in-stent restenosis which does not appear to be flow-limiting.     Furthermore, this is not significant to change in comparison to previous     catheterization from 2001.   PLAN:  The patient will be managed medically.  He will be started on a  proton pump inhibitor in addition to his other medications to treat  potential gastroesophageal reflux disease.                                               Junious Silk, M.D. [facility]    MWP/MEDQ  D:  08/17/2003  T:  08/19/2003  Job:  [id number]   cc:   Consuello Masse  [place] [id number]  Fax: [phone]   [facility] in [place]

## 2010-11-07 NOTE — Cardiovascular Report (Signed)
[facility]  Patient:    Gabriel Alvarez, Gabriel Alvarez                        MRN: [id number] Proc. Date: 09/08/99 Adm. Date:  [id number] Attending:  Ernestine Mcmurray CC:         Marijo Conception. Wall, M.D. [facility]             Consuello Masse, M.D.                        Cardiac Catheterization  PROCEDURE:  Coronary arteriography.  INDICATIONS:  Recurrent chest pain with previous stent to the distal right coronary artery.  FINDINGS: 1. LEFT MAIN CORONARY ARTERY:  Normal. 2. LEFT ANTERIOR DESCENDING CORONARY ARTERY:  Had 20% multiple discrete lesions in    the proximal and mid portion.  The distal vessel was small and diffusely    diseased.  The first diagonal branch had 20% multiple discrete lesions.  The  second diagonal branch was extremely small and diffusely diseased. 3. CIRCUMFLEX CORONARY ARTERY:  Large.  There were 20% multiple discrete lesions in    the proximal and mid portions.  The first obtuse marginal branch had 20%    multiple discrete lesions.  The distal vessel was of normal-sized disease. 4. RIGHT CORONARY ARTERY:  A medium-sized vessel with 30% eccentric stenosis    proximally.  The distal right coronary artery, at the site of the previous    stent, had a 50% tubular in-stent restenosis; however, there was a large    bifurcating RV branch coming off the middle of this stent, with a 90% ostial  lesion.  The distal vessel had 40% multiple, discrete lesions.  RAO VENTRICULOGRAPHY:  Revealed inferobasilar wall hypokinesis.  The ejection fraction was in the 50% range.  There was no gradient across the aortic valve and no MR.  HEMODYNAMICS: 1. Aortic Pressure:  120/81. 2. Left ventricular pressure:  120/14.  IMPRESSION:  Films will be reviewed with Dr. Vicenta Aly.  The in-stent restenosis and the native right coronary artery itself would no warrant intervention; however, there is a large RV branch whose ostium is jeopardized.  If we were to work  on he stent in the right coronary artery, I suspect we would lose the RV branch.  The angle of takeoff of the RV branch is extremely acute, and I am not sure it an be angioplastied through the stent.  Decision versus medical therapy versus angioplasty will be made after review with Dr. Vicenta Aly.DD:  09/08/99 TD:  09/08/99 Job: [id number]

## 2010-11-07 NOTE — Consult Note (Signed)
[facility]                          ENDOCRINOLOGY CONSULTATION   LEUL, NARRAMORE                        MRN:          [id number]  DATE:07/15/2006                            DOB:          01/26/1951    REASON FOR VISIT:  Followup diabetes.   HISTORY OF THE PRESENT ILLNESS:  A 60 year old man who states that  contrary to the last time he was here in October, his glucoses are in  the low 100s in the morning and in the high 100s in the afternoon.  He  states he is feeling well in general.   PAST MEDICAL HISTORY:  Same as April 08, 2006.   REVIEW OF SYSTEMS:  Denies hypoglycemia.   PHYSICAL EXAMINATION:  VITAL SIGNS:  Blood pressure 141/76, heart rate  72, temperature is 97.5, the weight is 255.  GENERAL:  No distress.  FEET:  Normal color and temperature.  There is no ulcer present on the  feet.  Sensation is intact to touch and dorsalis pedis pulses are intact  bilaterally.   IMPRESSION:  My sense is that he needs a simpler regimen still.  I had  prescribed him the premixed insulin to try to give him more insulin  during the day, but right now this does not seem to be working out  either.   PLAN:  1. Change to Lantus 65 units a day.  2. Return in 3 months.     Sean A. Loanne Drilling, MD  Electronically Signed    SAE/MedQ  DD: 07/16/2006  DT: 07/16/2006  Job #: [id number]   cc:   Consuello Masse

## 2010-11-07 NOTE — Consult Note (Signed)
[facility]                            ENDOCRINOLOGY CONSULTATION   Gabriel Alvarez, Gabriel Alvarez                        MRN:          [id number]  DATE:04/08/2006                            DOB:          [birth year]    REASON FOR VISIT:  Followup diabetes.   A 60 year old man who is on a relatively simple regimen of 75/25 insulin  twice a day. This is because he was really not able to keep up with a more  complex regimen. On his current schedule, he states that his glucoses are  much much better but continue to be higher in the morning than in the  afternoon.   PAST MEDICAL HISTORY:  1. Dyslipidemia.  2. ED.  3. Hypertension.  4. Diabetes nephropathy.  5. CAD.   REVIEW OF SYSTEMS:  He has occasional mild hypoglycemia if his lunch is  delayed. He has also lost a few pounds recently.   PHYSICAL EXAMINATION:  VITAL SIGNS:  Blood pressure 153/82, heart rate 56,  temperature is 97.5, the weight is 249.  GENERAL:  No distress.   Insulin injection sites at the anterior abdomen are normal.   IMPRESSION:  He seems to be doing much better on this simpler schedule of  insulin, but he needs some minor adjustments.   PLAN:  1. Change Humalog 75/25 insulin to 35 units before breakfast and 25 units      before lunch.  2. Return in 3 months.  3. You should have your hemoglobin A1c checked also. The patient wonders      if it is okay for      him to have this checked at his appointment with Dr. Quintin Alto. I have      told him the his is okay for patients we have in common.            ______________________________  Jacelyn Pi Loanne Drilling, MD   SAE/MedQ DD:  04/10/2006 DT:  04/12/2006 Job #:  [id number]   cc:   Consuello Masse

## 2010-11-07 NOTE — Discharge Summary (Signed)
[facility] [place]  Patient:    Gabriel Alvarez, Gabriel Alvarez                          MRN: [id number] Adm. Date:  09/05/99 Disc. Date: 09/09/99 Attending:  Marijo Conception. Wall, M.D. [facility] Dictator:   Ermalinda Barrios, P.A.C. CC:         Consuello Masse, M.D.             Thomas C. Wall, M.D. [facility]                  Referring Physician Discharge [facility]  ADMITTING DIAGNOSIS:  Chest pain.  DISCHARGE DIAGNOSES: 1. Chest pain.  Cardiac catheterization revealed a 95% RV branch, inferior    hypokinesis, ejection fraction 50%.  Patient refused follow-up stress    Cardiolite. 2. History of myocardial infarction and percutaneous transluminal coronary    angioplasty and stent to the right coronary artery in 1998. 3. Non-insulin-dependent diabetes mellitus. 4. Hypertension. 5. Smoker. 6. Chronic back pain, status post surgery x 3.  BRIEF HISTORY AND PHYSICAL:  Please see H&P for details.  This is a 60 year old  African-American male with history of coronary artery disease who was awakened ith substernal chest pain at 1:30 a.m.  Sublingual nitroglycerin did ease the pain ome but it returned, and he was treated with morphine via [place].  Patient was admitted, ruled out for an MI with negative enzymes and EKGs.  He underwent cardiac catheterization by Dr. Johnsie Cancel on March 19.  He had 50-60% in-stent stenosis, and then he had a 95% lesion in the RV branch.  Other arteries revealed scattered 20-30% lesions.  He had inferior hypokinesis, ejection fraction 50%.  Dr. Johnsie Cancel reviewed the films with Dr. Vicenta Aly and they did not feel his RCA lesion was critical, but the RV branch ostium was significant and they recommended a Cardiolite scan to evaluate this lesion further.  When the patient was under the scanner, the patient refused any further Cardiolite.  He said he has chronic back pain and he refuses to lie on the table.  We offered to give him some Ativan or  Valium to help relax him and  discussed the reasons why the Cardiolite was ordered, but the patient continues to refuse any further testing and is insisting on discharge.  Patient will be discharged home without any further workup.  LABORATORY VALUES:  Hemoglobin 14.9, hematocrit 41, white count 8.2, platelets 07. CK-MBs and troponins negative.  Cholesterol was 221, triglycerides 231, HDL 35, LDL 140.  EKG - Normal sinus rhythm, no acute EKG changes.  DISPOSITION:  Patient is discharged home in stable condition on the following medications.  DISCHARGE MEDICATIONS: 1. Imdur 30 mg q.d. 2. Lopressor 25 mg b.i.d. 3. Avandia 4 mg q.d. 4. Vasotec 10 mg q.d. 5. Glucophage 850 mg t.i.d. 6. Coated aspirin q.d. 7. Glyburide 5 mg two in the morning, one in the evening. 8. Trimox 500 mg t.i.d. for an upper respiratory infection. 9. Nitroglycerin p.r.n.  ACTIVITY:  He is to do no heavy lifting or strenuous activity for two to three days.  DIET:  He is to follow a low fat diabetic diet.  FOLLOW-UP:  He is to call and make a follow-up appointment to see Dr. Verl Blalock back in three to four weeks, at which time starting him on a lipid lowering agent should be addressed. DD:  09/09/99 TD:  09/09/99 Job: [id number]

## 2010-12-08 ENCOUNTER — Encounter (HOSPITAL_COMMUNITY)
Admission: RE | Admit: 2010-12-08 | Discharge: 2010-12-08 | Disposition: A | Discharge status: Home / Self Care | Payer: Managed Care, Other (non HMO) | Source: Ambulatory Visit | Attending: Ophthalmology | Admitting: Ophthalmology

## 2010-12-08 LAB — BASIC METABOLIC PANEL
BUN: 22 mg/dL (ref 6–23)
Calcium: 9.9 mg/dL (ref 8.4–10.5)
GFR calc non Af Amer: 51 mL/min — ABNORMAL LOW (ref >60)
Glucose, Bld: 272 mg/dL — ABNORMAL HIGH (ref 70–99)
Sodium: 138 mEq/L (ref 135–145)

## 2010-12-11 ENCOUNTER — Encounter: Payer: Self-pay | Admitting: Endocrinology

## 2010-12-11 ENCOUNTER — Ambulatory Visit (INDEPENDENT_AMBULATORY_CARE_PROVIDER_SITE_OTHER): Payer: Managed Care, Other (non HMO) | Admitting: Endocrinology

## 2010-12-11 VITALS — BP 124/78 | HR 88 | Temp 98.1°F | Ht 71.0 in | Wt 257.2 lb

## 2010-12-11 DIAGNOSIS — E119 Type 2 diabetes mellitus without complications: Secondary | ICD-10-CM

## 2010-12-11 NOTE — Patient Instructions (Addendum)
reduce lantus to 140 units each morning. Please make a follow-up appointment in 6 weeks. good diet and exercise habits significanly improve the control of your diabetes.  please let me know if you wish to be referred to a dietician.  high blood sugar is very risky to your health.  you should see an eye doctor every year. controlling your blood pressure and cholesterol drastically reduces the damage diabetes does to your body.  this also applies to quitting smoking.  please discuss these with your doctor.  you should take an aspirin every day, unless you have been advised by a doctor not to. check your blood sugar 2 times a day.  vary the time of day when you check, between before the 3 meals, and at bedtime.  also check if you have symptoms of your blood sugar being too high or too low.  please keep a record of the readings and bring it to your next appointment here.  please call us sooner if you are having low blood sugar episodes.

## 2010-12-11 NOTE — Progress Notes (Signed)
  Subjective:    Patient ID: Gabriel Alvarez, male    DOB: November 23, 1950, 60 y.o.   MRN: [id number]  HPI pt states he feels well in general, except he has to eat extra, in order to fend-off hypoglycemia.  he brings a record of his cbg's which i have reviewed today.  It varies from 56-200, but most are in the low-100's.  There is no trend throughout the day. Past Medical History  Diagnosis Date  . ED (erectile dysfunction)   . DM neuropathy, type II diabetes mellitus   . DIABETES MELLITUS, TYPE II 12/30/2006  . HYPERCHOLESTEROLEMIA 07/30/2008  . GOUT 07/30/2008  . HYPERTENSION 12/30/2006  . CORONARY ARTERY DISEASE 12/30/2006  . CEREBROVASCULAR ACCIDENT, ACUTE 09/05/2008    No past surgical history on file.  History   Social History  . Marital Status: Married    Spouse Name: N/A    Number of Children: N/A  . Years of Education: N/A   Occupational History  .      disabled   Social History Main Topics  . Smoking status: Current Everyday Smoker -- 1.0 packs/day for 30 years    Types: Cigarettes  . Smokeless tobacco: Not on file  . Alcohol Use: No  . Drug Use: No  . Sexually Active: Not on file   Other Topics Concern  . Not on file   Social History Narrative  . No narrative on file    Current Outpatient Prescriptions on File Prior to Visit  Medication Sig Dispense Refill  . aspirin 325 MG tablet Take 325 mg by mouth 2 (two) times daily.        . clopidogrel (PLAVIX) 75 MG tablet Take 75 mg by mouth daily.        . enalapril (VASOTEC) 20 MG tablet Take 20 mg by mouth daily.        . hydrochlorothiazide 25 MG tablet 1 tablet by mouth once daily      . insulin glargine (LANTUS) 100 UNIT/ML injection Inject 150 Units into the skin daily. Each morning.  And syringes for use 2/day.  This prescription replaces levemir  150 mL  3  . metFORMIN (GLUCOPHAGE) 850 MG tablet Take 850 mg by mouth 2 (two) times daily with a meal.        . metoprolol (TOPROL-XL) 50 MG 24 hr tablet Take 50 mg by  mouth daily.        Marland Kitchen DISCONTD: metoprolol (LOPRESSOR) 50 MG tablet Take 50 mg by mouth 2 (two) times daily.          No Known Allergies  Family History  Problem Relation Age of Onset  . Diabetes Father     BP 124/78  Pulse 88  Temp(Src) 98.1 F (36.7 C) (Oral)  Ht 5' 11" (1.803 m)  Wt 257 lb 3.2 oz (116.665 kg)  BMI 35.87 kg/m2  SpO2 89%    Review of Systems Denies loc    Objective:   Physical Exam Pulses: dorsalis pedis intact bilat.   Feet: no deformity.  no ulcer on the feet.  feet are of normal color and temp.  no edema Neuro: sensation is intact to touch on the feet     Assessment & Plan:  Type 2 dm, overcontrolled.

## 2010-12-15 ENCOUNTER — Ambulatory Visit (HOSPITAL_COMMUNITY)
Admission: RE | Admit: 2010-12-15 | Discharge: 2010-12-15 | Disposition: A | Discharge status: Home / Self Care | Payer: Managed Care, Other (non HMO) | Source: Ambulatory Visit | Attending: Ophthalmology | Admitting: Ophthalmology

## 2010-12-15 DIAGNOSIS — Z01810 Encounter for preprocedural cardiovascular examination: Secondary | ICD-10-CM | POA: Insufficient documentation

## 2010-12-15 DIAGNOSIS — Z794 Long term (current) use of insulin: Secondary | ICD-10-CM | POA: Insufficient documentation

## 2010-12-15 DIAGNOSIS — Z01812 Encounter for preprocedural laboratory examination: Secondary | ICD-10-CM | POA: Insufficient documentation

## 2010-12-15 DIAGNOSIS — Z79899 Other long term (current) drug therapy: Secondary | ICD-10-CM | POA: Insufficient documentation

## 2010-12-15 DIAGNOSIS — H2589 Other age-related cataract: Secondary | ICD-10-CM | POA: Insufficient documentation

## 2010-12-15 DIAGNOSIS — J449 Chronic obstructive pulmonary disease, unspecified: Secondary | ICD-10-CM | POA: Insufficient documentation

## 2010-12-15 DIAGNOSIS — J4489 Other specified chronic obstructive pulmonary disease: Secondary | ICD-10-CM | POA: Insufficient documentation

## 2010-12-15 DIAGNOSIS — E119 Type 2 diabetes mellitus without complications: Secondary | ICD-10-CM | POA: Insufficient documentation

## 2010-12-15 DIAGNOSIS — I10 Essential (primary) hypertension: Secondary | ICD-10-CM | POA: Insufficient documentation

## 2010-12-15 LAB — GLUCOSE, CAPILLARY

## 2010-12-16 ENCOUNTER — Telehealth: Payer: Self-pay | Admitting: *Deleted

## 2010-12-16 NOTE — Telephone Encounter (Signed)
Reduce lantus to 125 units each am

## 2010-12-16 NOTE — Telephone Encounter (Signed)
Pt called states BS keeps dropping low. Yesterday am it was 87, PM 80. This morning it was 84. Check after lunch it drop down to 65. Pt is advising on what he need to do concerning his insulin.

## 2010-12-16 NOTE — Telephone Encounter (Signed)
Notified pt with md response...12/16/10_0 :09pm/LMB

## 2010-12-29 NOTE — Op Note (Signed)
  Gabriel Alvarez, Gabriel Alvarez NO.:  [id number]  MEDICAL RECORD NO.:  [id number]  LOCATION:  DAYP                          FACILITY:  [facility]  PHYSICIAN:  Richardo Hanks, MD       DATE OF BIRTH:  [birth year]  DATE OF PROCEDURE:  12/15/2010 DATE OF DISCHARGE:  12/15/2010                              OPERATIVE REPORT   PREOPERATIVE DIAGNOSIS:  Combined cataract, right eye.  POSTOPERATIVE DIAGNOSIS:  Combined cataract, right eye.  DIAGNOSIS CODE:  366.19.  OPERATION PERFORMED:  Phacoemulsification with posterior chamber intraocular lens implantation, right eye.  SURGEON:  Franky Macho. Brannon Decaire, MD  ANESTHESIA:  General endotracheal anesthesia.  OPERATIVE SUMMARY:  In the preoperative area, dilating drops were placed into the right eye.  The patient was then brought into the operating room where he was placed under general anesthesia.  The eye was then prepped and draped.  Beginning with a 75 blade, a paracentesis port was made at the surgeon's 2 o'clock position.  The anterior chamber was then filled with a 1% nonpreserved lidocaine solution with epinephrine.  This was followed by Viscoat to deepen the chamber.  A small fornix-based peritomy was performed superiorly.  Next, a single iris hook was placed through the limbus superiorly.  A 2.4-mm keratome blade was then used to make a clear corneal incision over the iris hook.  A bent cystotome needle and Utrata forceps were used to create a continuous tear capsulotomy.  Hydrodissection was performed using balanced salt solution on a fine cannula.  The lens nucleus was then removed using phacoemulsification in a quadrant cracking technique.  The cortical material was then removed with irrigation and aspiration.  The capsular bag and anterior chamber were refilled with Provisc.  The wound was widened to approximately 3 mm and a posterior chamber intraocular lens was placed into the capsular bag without difficulty using an  [employer] lens injecting system.  A single 10-0 nylon suture was then used to close the incision as well as stromal hydration.  The Provisc was removed from the anterior chamber and capsular bag with irrigation and aspiration.  At this point, the wounds were tested for leak, which were negative.  The anterior chamber remained deep and stable.  The patient tolerated the procedure well.  There were no operative complications, and he awoke from general anesthesia without problem.  No surgical specimens.  The prosthetic device used is a Lenstec posterior chamber lens model Softec HD, power of 17.5, serial number is [id number].          ______________________________ Richardo Hanks, MD     KEH/MEDQ  D:  12/15/2010  T:  12/15/2010  Job:  [id number]  Electronically Signed by Tonny Branch MD on 12/29/2010 12:39:55 PM

## 2011-01-22 ENCOUNTER — Encounter: Payer: Self-pay | Admitting: Endocrinology

## 2011-01-22 ENCOUNTER — Other Ambulatory Visit (INDEPENDENT_AMBULATORY_CARE_PROVIDER_SITE_OTHER): Payer: Managed Care, Other (non HMO)

## 2011-01-22 ENCOUNTER — Ambulatory Visit (INDEPENDENT_AMBULATORY_CARE_PROVIDER_SITE_OTHER): Payer: Managed Care, Other (non HMO) | Admitting: Endocrinology

## 2011-01-22 VITALS — BP 132/78 | HR 82 | Temp 98.3°F | Ht 71.0 in | Wt 234.4 lb

## 2011-01-22 DIAGNOSIS — E119 Type 2 diabetes mellitus without complications: Secondary | ICD-10-CM

## 2011-01-22 NOTE — Patient Instructions (Addendum)
blood tests are being ordered for you today.  please call [phone] to hear your test results.  You will be prompted to enter the 9-digit "MRN" number that appears at the top left of this page, followed by #.  Then you will hear the message.   pending the test results, please reduce lantus to 110 units each morning. Please make a follow-up appointment in 3 months. check your blood sugar 2 times a day.  vary the time of day when you check, between before the 3 meals, and at bedtime.  also check if you have symptoms of your blood sugar being too high or too low.  please keep a record of the readings and bring it to your next appointment here.  please call us sooner if you are having low blood sugar episodes. (update: i left message on phone-tree:  rx as we discussed)

## 2011-01-22 NOTE — Progress Notes (Signed)
  Subjective:    Patient ID: Gabriel Alvarez, male    DOB: 1951-05-27, 60 y.o.   MRN: [id number]  HPI Despite the reduction of his insulin, he continues to have mild hypoglycemia at any time of day.  he brings a record of his cbg's which i have reviewed today.  Most are in the low-100's.  He c/o weight gain, as he feels he has to "out-eat" the insulin. Past Medical History  Diagnosis Date  . ED (erectile dysfunction)   . DM neuropathy, type II diabetes mellitus   . DIABETES MELLITUS, TYPE II 12/30/2006  . HYPERCHOLESTEROLEMIA 07/30/2008  . GOUT 07/30/2008  . HYPERTENSION 12/30/2006  . CORONARY ARTERY DISEASE 12/30/2006  . CEREBROVASCULAR ACCIDENT, ACUTE 09/05/2008    No past surgical history on file.  History   Social History  . Marital Status: Married    Spouse Name: N/A    Number of Children: N/A  . Years of Education: N/A   Occupational History  .      disabled   Social History Main Topics  . Smoking status: Current Everyday Smoker -- 1.0 packs/day for 30 years    Types: Cigarettes  . Smokeless tobacco: Not on file  . Alcohol Use: No  . Drug Use: No  . Sexually Active: Not on file   Other Topics Concern  . Not on file   Social History Narrative  . No narrative on file    Current Outpatient Prescriptions on File Prior to Visit  Medication Sig Dispense Refill  . aspirin 325 MG tablet Take 325 mg by mouth 2 (two) times daily.        . clopidogrel (PLAVIX) 75 MG tablet Take 75 mg by mouth daily.        . enalapril (VASOTEC) 20 MG tablet Take 20 mg by mouth daily.        . hydrochlorothiazide 25 MG tablet 1 tablet by mouth once daily      . metFORMIN (GLUCOPHAGE) 850 MG tablet Take 850 mg by mouth 2 (two) times daily with a meal.        . metoprolol (TOPROL-XL) 50 MG 24 hr tablet Take 50 mg by mouth daily.        . simvastatin (ZOCOR) 20 MG tablet Take 20 mg by mouth at bedtime.          No Known Allergies  Family History  Problem Relation Age of Onset  . Diabetes  Father     BP 132/78  Pulse 82  Temp(Src) 98.3 F (36.8 C) (Oral)  Ht _0  (1.803 m)  Wt 234 lb 6.4 oz (106.323 kg)  BMI 32.69 kg/m2  SpO2 89%  Review of Systems Denies loc    Objective:   Physical Exam GENERAL: no distress Pulses: dorsalis pedis intact bilat.   Feet: no deformity.  no ulcer on the feet.  feet are of normal color and temp.  no edema Neuro: sensation is intact to touch on the feet  Lab Results  Component Value Date   HGBA1C 7.7* 01/22/2011      Assessment & Plan:  Dm, overcontrolled given this regimen, which does match insulin to her changing needs throughout the day

## 2011-04-23 ENCOUNTER — Other Ambulatory Visit (INDEPENDENT_AMBULATORY_CARE_PROVIDER_SITE_OTHER): Payer: Managed Care, Other (non HMO)

## 2011-04-23 ENCOUNTER — Encounter: Payer: Self-pay | Admitting: Endocrinology

## 2011-04-23 ENCOUNTER — Ambulatory Visit (INDEPENDENT_AMBULATORY_CARE_PROVIDER_SITE_OTHER): Payer: Managed Care, Other (non HMO) | Admitting: Endocrinology

## 2011-04-23 VITALS — BP 130/76 | HR 73 | Temp 98.2°F | Ht 70.0 in | Wt 256.2 lb

## 2011-04-23 DIAGNOSIS — E119 Type 2 diabetes mellitus without complications: Secondary | ICD-10-CM

## 2011-04-23 NOTE — Progress Notes (Signed)
  Subjective:    Patient ID: Gabriel Alvarez, male    DOB: [birth year], 60 y.o.   MRN: [id number]  HPI Pt says left knee pain is limiting exercise rx.  he brings a record of his cbg's which i have reviewed today.  It varies from 70-150.  There is no trend throughout the day. Past Medical History  Diagnosis Date  . ED (erectile dysfunction)   . DM neuropathy, type II diabetes mellitus   . DIABETES MELLITUS, TYPE II 12/30/2006  . HYPERCHOLESTEROLEMIA 07/30/2008  . GOUT 07/30/2008  . HYPERTENSION 12/30/2006  . CORONARY ARTERY DISEASE 12/30/2006  . CEREBROVASCULAR ACCIDENT, ACUTE 09/05/2008    No past surgical history on file.  History   Social History  . Marital Status: Married    Spouse Name: N/A    Number of Children: N/A  . Years of Education: N/A   Occupational History  .      disabled   Social History Main Topics  . Smoking status: Current Everyday Smoker -- 1.0 packs/day for 30 years    Types: Cigarettes  . Smokeless tobacco: Not on file  . Alcohol Use: No  . Drug Use: No  . Sexually Active: Not on file   Other Topics Concern  . Not on file   Social History Narrative  . No narrative on file    Current Outpatient Prescriptions on File Prior to Visit  Medication Sig Dispense Refill  . aspirin 325 MG tablet Take 325 mg by mouth 2 (two) times daily.        . clopidogrel (PLAVIX) 75 MG tablet Take 75 mg by mouth daily.        . enalapril (VASOTEC) 20 MG tablet Take 20 mg by mouth daily.        . hydrochlorothiazide 25 MG tablet 1 tablet by mouth once daily      . insulin glargine (LANTUS) 100 UNIT/ML injection Inject 100 Units into the skin daily. Each morning.  And syringes for use 2/day.  This prescription replaces levemir      . metFORMIN (GLUCOPHAGE) 850 MG tablet Take 850 mg by mouth 2 (two) times daily with a meal.        . metoprolol (TOPROL-XL) 50 MG 24 hr tablet Take 50 mg by mouth daily.        . simvastatin (ZOCOR) 20 MG tablet Take 20 mg by mouth at bedtime.           No Known Allergies  Family History  Problem Relation Age of Onset  . Diabetes Father     BP 130/76  Pulse 73  Temp(Src) 98.2 F (36.8 C) (Oral)  Ht 5' 10" (1.778 m)  Wt 256 lb 3.2 oz (116.212 kg)  BMI 36.76 kg/m2  SpO2 92%  Review of Systems He reports weight gain    Objective:   Physical Exam VITAL SIGNS:  See vs page GENERAL: no distress SKIN:  Insulin injection sites at the anterior abdomen are normal  Lab Results  Component Value Date   HGBA1C 7.9* 04/23/2011      Assessment & Plan:  Dm, therapy limited by pt's need for a simple regimen.  However, he is doing better in terms of his a1c.  Weight gain may be due to improvement in his glycemic control. Knee pain.  This limits his exercise rx.

## 2011-04-23 NOTE — Patient Instructions (Addendum)
blood tests are being ordered for you today.  please call [phone] to hear your test results.  You will be prompted to enter the 9-digit "MRN" number that appears at the top left of this page, followed by #.  Then you will hear the message.   pending the test results, please reduce lantus to 110 units each morning. Please make a follow-up appointment in 3 months. check your blood sugar 2 times a day.  vary the time of day when you check, between before the 3 meals, and at bedtime.  also check if you have symptoms of your blood sugar being too high or too low.  please keep a record of the readings and bring it to your next appointment here.  please call us sooner if you are having low blood sugar episodes. (update: i left message on phone-tree:  reduce to 100 units.  Redouble diet efforts)

## 2011-07-13 DIAGNOSIS — E7800 Pure hypercholesterolemia, unspecified: Secondary | ICD-10-CM | POA: Diagnosis not present

## 2011-07-13 DIAGNOSIS — I10 Essential (primary) hypertension: Secondary | ICD-10-CM | POA: Diagnosis not present

## 2011-07-13 DIAGNOSIS — IMO0001 Reserved for inherently not codable concepts without codable children: Secondary | ICD-10-CM | POA: Diagnosis not present

## 2011-07-16 DIAGNOSIS — I10 Essential (primary) hypertension: Secondary | ICD-10-CM | POA: Diagnosis not present

## 2011-07-16 DIAGNOSIS — M1990 Unspecified osteoarthritis, unspecified site: Secondary | ICD-10-CM | POA: Diagnosis not present

## 2011-07-16 DIAGNOSIS — F17200 Nicotine dependence, unspecified, uncomplicated: Secondary | ICD-10-CM | POA: Diagnosis not present

## 2011-07-16 DIAGNOSIS — E162 Hypoglycemia, unspecified: Secondary | ICD-10-CM | POA: Diagnosis not present

## 2011-07-16 DIAGNOSIS — E7800 Pure hypercholesterolemia, unspecified: Secondary | ICD-10-CM | POA: Diagnosis not present

## 2011-07-16 DIAGNOSIS — E669 Obesity, unspecified: Secondary | ICD-10-CM | POA: Diagnosis not present

## 2011-07-29 ENCOUNTER — Ambulatory Visit (INDEPENDENT_AMBULATORY_CARE_PROVIDER_SITE_OTHER): Payer: Managed Care, Other (non HMO) | Admitting: Endocrinology

## 2011-07-29 ENCOUNTER — Encounter: Payer: Self-pay | Admitting: Endocrinology

## 2011-07-29 DIAGNOSIS — E119 Type 2 diabetes mellitus without complications: Secondary | ICD-10-CM | POA: Diagnosis not present

## 2011-07-29 NOTE — Patient Instructions (Addendum)
pending the test results, please continue lantus at 100 units each morning. Please make a follow-up appointment in 3 months. check your blood sugar 2 times a day.  vary the time of day when you check, between before the 3 meals, and at bedtime.  also check if you have symptoms of your blood sugar being too high or too low.  please keep a record of the readings and bring it to your next appointment here.  please call us sooner if you are having low blood sugar episodes.

## 2011-07-29 NOTE — Progress Notes (Signed)
  Subjective:    Patient ID: Gabriel Alvarez, male    DOB: [birth year], 61 y.o.   MRN: [id number]  HPI Pt returns for f/u of insulin-requiring DM (2003).  pt states he feels well in general.  He seldom has hypoglycemia, if lunch is delayed.  It is highest at hs (high-100's).   Past Medical History  Diagnosis Date  . ED (erectile dysfunction)   . DM neuropathy, type II diabetes mellitus   . DIABETES MELLITUS, TYPE II 12/30/2006  . HYPERCHOLESTEROLEMIA 07/30/2008  . GOUT 07/30/2008  . HYPERTENSION 12/30/2006  . CORONARY ARTERY DISEASE 12/30/2006  . CEREBROVASCULAR ACCIDENT, ACUTE 09/05/2008    No past surgical history on file.  History   Social History  . Marital Status: Married    Spouse Name: N/A    Number of Children: N/A  . Years of Education: N/A   Occupational History  .      disabled   Social History Main Topics  . Smoking status: Current Everyday Smoker -- 1.0 packs/day for 30 years    Types: Cigarettes  . Smokeless tobacco: Not on file  . Alcohol Use: No  . Drug Use: No  . Sexually Active: Not on file   Other Topics Concern  . Not on file   Social History Narrative  . No narrative on file    Current Outpatient Prescriptions on File Prior to Visit  Medication Sig Dispense Refill  . aspirin 325 MG tablet Take 325 mg by mouth 2 (two) times daily.        . clopidogrel (PLAVIX) 75 MG tablet Take 75 mg by mouth daily.        . enalapril (VASOTEC) 20 MG tablet Take 20 mg by mouth daily.        . hydrochlorothiazide 25 MG tablet 1 tablet by mouth once daily      . insulin glargine (LANTUS) 100 UNIT/ML injection Inject 100 Units into the skin daily. Each morning.  And syringes for use 2/day.  This prescription replaces levemir      . metFORMIN (GLUCOPHAGE) 850 MG tablet Take 850 mg by mouth 2 (two) times daily with a meal.        . metoprolol (TOPROL-XL) 50 MG 24 hr tablet Take 50 mg by mouth daily.        . simvastatin (ZOCOR) 20 MG tablet Take 20 mg by mouth at bedtime.           No Known Allergies  Family History  Problem Relation Age of Onset  . Diabetes Father     BP 150/88  Pulse 87  Temp(Src) 97.5 F (36.4 C) (Oral)  Ht _0  (1.778 m)  Wt 261 lb (118.389 kg)  BMI 37.45 kg/m2  SpO2 88%  Review of Systems Denies loc.      Objective:   Physical Exam VITAL SIGNS:  See vs page GENERAL: no distress Pulses: dorsalis pedis intact bilat.   Feet: no deformity.  no ulcer on the feet.  feet are of normal color and temp.  no edema Neuro: sensation is intact to touch on the feet   outside test results are reviewed: A1c=8.9    Assessment & Plan:  DM.  To safely improve his a1c, he would need to at least take 1 injection of fast-acting insulin with evening meal.  He declines.

## 2011-10-18 ENCOUNTER — Other Ambulatory Visit: Payer: Self-pay | Admitting: Endocrinology

## 2011-10-29 ENCOUNTER — Telehealth: Payer: Self-pay | Admitting: *Deleted

## 2011-10-29 ENCOUNTER — Ambulatory Visit (INDEPENDENT_AMBULATORY_CARE_PROVIDER_SITE_OTHER): Payer: Managed Care, Other (non HMO) | Admitting: Endocrinology

## 2011-10-29 ENCOUNTER — Other Ambulatory Visit (INDEPENDENT_AMBULATORY_CARE_PROVIDER_SITE_OTHER): Payer: Managed Care, Other (non HMO)

## 2011-10-29 ENCOUNTER — Encounter: Payer: Self-pay | Admitting: Endocrinology

## 2011-10-29 VITALS — BP 128/72 | HR 87 | Temp 97.5°F | Ht 70.0 in | Wt 263.0 lb

## 2011-10-29 DIAGNOSIS — E1059 Type 1 diabetes mellitus with other circulatory complications: Secondary | ICD-10-CM

## 2011-10-29 DIAGNOSIS — E1151 Type 2 diabetes mellitus with diabetic peripheral angiopathy without gangrene: Secondary | ICD-10-CM | POA: Insufficient documentation

## 2011-10-29 DIAGNOSIS — I798 Other disorders of arteries, arterioles and capillaries in diseases classified elsewhere: Secondary | ICD-10-CM

## 2011-10-29 NOTE — Patient Instructions (Addendum)
Please make a follow-up appointment in 3 months. check your blood sugar 2 times a day.  vary the time of day when you check, between before the 3 meals, and at bedtime.  also check if you have symptoms of your blood sugar being too high or too low.  please keep a record of the readings and bring it to your next appointment here.  please call us sooner if you are having low blood sugar episodes.   blood tests are being requested for you today.  You will receive a letter with results. pending the test results, please continue the same insulin for now Stop metformin. good diet and exercise habits significanly improve the control of your diabetes.  please let me know if you wish to be referred to a dietician.  high blood sugar is very risky to your health.  you should see an eye doctor every year. controlling your blood pressure and cholesterol drastically reduces the damage diabetes does to your body.  this also applies to quitting smoking.  please discuss these with your doctor.  you should take an aspirin every day, unless you have been advised by a doctor not to. check your blood sugar twice a day.  vary the time of day when you check, between before the 3 meals, and at bedtime.  also check if you have symptoms of your blood sugar being too high or too low.  please keep a record of the readings and bring it to your next appointment here.  please call us sooner if your blood sugar goes below 70, or if it stays over 200.

## 2011-10-29 NOTE — Progress Notes (Signed)
  Subjective:    Patient ID: Gabriel Alvarez, male    DOB: 1950/11/28, 61 y.o.   MRN: [id number]  HPI Pt returns for f/u of insulin-requiring DM (dx'ed 4403; complicated by CAD and PAD).  he brings a record of his cbg's which i have reviewed today.  It varies from 79-159, but most are in the low-100's.  Past Medical History  Diagnosis Date  . ED (erectile dysfunction)   . DM neuropathy, type II diabetes mellitus   . DIABETES MELLITUS, TYPE II 12/30/2006  . HYPERCHOLESTEROLEMIA 07/30/2008  . GOUT 07/30/2008  . HYPERTENSION 12/30/2006  . CORONARY ARTERY DISEASE 12/30/2006  . CEREBROVASCULAR ACCIDENT, ACUTE 09/05/2008    No past surgical history on file.  History   Social History  . Marital Status: Married    Spouse Name: N/A    Number of Children: N/A  . Years of Education: N/A   Occupational History  .      disabled   Social History Main Topics  . Smoking status: Current Everyday Smoker -- 1.0 packs/day for 30 years    Types: Cigarettes  . Smokeless tobacco: Not on file  . Alcohol Use: No  . Drug Use: No  . Sexually Active: Not on file   Other Topics Concern  . Not on file   Social History Narrative  . No narrative on file    Current Outpatient Prescriptions on File Prior to Visit  Medication Sig Dispense Refill  . aspirin 325 MG tablet Take 325 mg by mouth 2 (two) times daily.        . clopidogrel (PLAVIX) 75 MG tablet Take 75 mg by mouth daily.        . enalapril (VASOTEC) 20 MG tablet Take 20 mg by mouth daily.        . hydrochlorothiazide 25 MG tablet 1 tablet by mouth once daily      . insulin glargine (LANTUS) 100 UNIT/ML injection Inject 100 Units into the skin every morning.  10 vial  2  . metoprolol (TOPROL-XL) 50 MG 24 hr tablet Take 50 mg by mouth daily.        . simvastatin (ZOCOR) 20 MG tablet Take 20 mg by mouth at bedtime.          No Known Allergies  Family History  Problem Relation Age of Onset  . Diabetes Father     BP 128/72  Pulse 87   Temp(Src) 97.5 F (36.4 C) (Oral)  Ht _0  (1.778 m)  Wt 263 lb (119.296 kg)  BMI 37.74 kg/m2  SpO2 87%   Review of Systems denies hypoglycemia.    Objective:   Physical Exam VITAL SIGNS:  See vs page GENERAL: no distress SKIN:  Insulin injection sites at the anterior abdomen are normal.    Lab Results  Component Value Date   HGBA1C 7.7* 10/29/2011      Assessment & Plan:  DM is overcontrolled. Therapy is limited by this regimen, which does match insulin to his changing needs throughout the day

## 2011-10-29 NOTE — Telephone Encounter (Signed)
Called pt to inform of lab results, pt informed (letter also mailed to pt). 

## 2011-11-18 DIAGNOSIS — I10 Essential (primary) hypertension: Secondary | ICD-10-CM | POA: Diagnosis not present

## 2011-11-18 DIAGNOSIS — E7800 Pure hypercholesterolemia, unspecified: Secondary | ICD-10-CM | POA: Diagnosis not present

## 2011-11-24 DIAGNOSIS — E669 Obesity, unspecified: Secondary | ICD-10-CM | POA: Diagnosis not present

## 2011-11-24 DIAGNOSIS — F17200 Nicotine dependence, unspecified, uncomplicated: Secondary | ICD-10-CM | POA: Diagnosis not present

## 2011-11-24 DIAGNOSIS — Z0000 Encounter for general adult medical examination without abnormal findings: Secondary | ICD-10-CM | POA: Diagnosis not present

## 2011-11-24 DIAGNOSIS — E7800 Pure hypercholesterolemia, unspecified: Secondary | ICD-10-CM | POA: Diagnosis not present

## 2011-11-24 DIAGNOSIS — M1990 Unspecified osteoarthritis, unspecified site: Secondary | ICD-10-CM | POA: Diagnosis not present

## 2011-11-24 DIAGNOSIS — I10 Essential (primary) hypertension: Secondary | ICD-10-CM | POA: Diagnosis not present

## 2011-12-28 DIAGNOSIS — A938 Other specified arthropod-borne viral fevers: Secondary | ICD-10-CM | POA: Diagnosis not present

## 2011-12-28 DIAGNOSIS — Z7982 Long term (current) use of aspirin: Secondary | ICD-10-CM | POA: Diagnosis not present

## 2011-12-28 DIAGNOSIS — J9600 Acute respiratory failure, unspecified whether with hypoxia or hypercapnia: Secondary | ICD-10-CM | POA: Diagnosis not present

## 2011-12-28 DIAGNOSIS — J984 Other disorders of lung: Secondary | ICD-10-CM | POA: Diagnosis not present

## 2011-12-28 DIAGNOSIS — I2510 Atherosclerotic heart disease of native coronary artery without angina pectoris: Secondary | ICD-10-CM | POA: Diagnosis not present

## 2011-12-28 DIAGNOSIS — R9389 Abnormal findings on diagnostic imaging of other specified body structures: Secondary | ICD-10-CM | POA: Diagnosis not present

## 2011-12-28 DIAGNOSIS — R079 Chest pain, unspecified: Secondary | ICD-10-CM | POA: Diagnosis not present

## 2011-12-28 DIAGNOSIS — R0989 Other specified symptoms and signs involving the circulatory and respiratory systems: Secondary | ICD-10-CM | POA: Diagnosis not present

## 2011-12-28 DIAGNOSIS — J9620 Acute and chronic respiratory failure, unspecified whether with hypoxia or hypercapnia: Secondary | ICD-10-CM | POA: Diagnosis not present

## 2011-12-28 DIAGNOSIS — Z79899 Other long term (current) drug therapy: Secondary | ICD-10-CM | POA: Diagnosis not present

## 2011-12-28 DIAGNOSIS — Z7902 Long term (current) use of antithrombotics/antiplatelets: Secondary | ICD-10-CM | POA: Diagnosis not present

## 2011-12-28 DIAGNOSIS — E7800 Pure hypercholesterolemia, unspecified: Secondary | ICD-10-CM | POA: Diagnosis present

## 2011-12-28 DIAGNOSIS — I10 Essential (primary) hypertension: Secondary | ICD-10-CM | POA: Diagnosis present

## 2011-12-28 DIAGNOSIS — E119 Type 2 diabetes mellitus without complications: Secondary | ICD-10-CM | POA: Diagnosis present

## 2011-12-28 DIAGNOSIS — R509 Fever, unspecified: Secondary | ICD-10-CM | POA: Diagnosis not present

## 2011-12-28 DIAGNOSIS — Z87891 Personal history of nicotine dependence: Secondary | ICD-10-CM | POA: Diagnosis not present

## 2011-12-28 DIAGNOSIS — R0902 Hypoxemia: Secondary | ICD-10-CM | POA: Diagnosis not present

## 2011-12-28 DIAGNOSIS — R42 Dizziness and giddiness: Secondary | ICD-10-CM | POA: Diagnosis not present

## 2011-12-28 DIAGNOSIS — Z794 Long term (current) use of insulin: Secondary | ICD-10-CM | POA: Diagnosis not present

## 2011-12-28 DIAGNOSIS — Z833 Family history of diabetes mellitus: Secondary | ICD-10-CM | POA: Diagnosis not present

## 2011-12-28 DIAGNOSIS — R918 Other nonspecific abnormal finding of lung field: Secondary | ICD-10-CM | POA: Diagnosis present

## 2011-12-28 DIAGNOSIS — I252 Old myocardial infarction: Secondary | ICD-10-CM | POA: Diagnosis not present

## 2011-12-28 DIAGNOSIS — J189 Pneumonia, unspecified organism: Secondary | ICD-10-CM | POA: Diagnosis not present

## 2011-12-28 DIAGNOSIS — R599 Enlarged lymph nodes, unspecified: Secondary | ICD-10-CM | POA: Diagnosis not present

## 2011-12-28 DIAGNOSIS — Z8249 Family history of ischemic heart disease and other diseases of the circulatory system: Secondary | ICD-10-CM | POA: Diagnosis not present

## 2011-12-29 DIAGNOSIS — R072 Precordial pain: Secondary | ICD-10-CM

## 2011-12-29 DIAGNOSIS — R0989 Other specified symptoms and signs involving the circulatory and respiratory systems: Secondary | ICD-10-CM | POA: Diagnosis not present

## 2012-01-07 DIAGNOSIS — J209 Acute bronchitis, unspecified: Secondary | ICD-10-CM | POA: Diagnosis not present

## 2012-01-07 DIAGNOSIS — S30860A Insect bite (nonvenomous) of lower back and pelvis, initial encounter: Secondary | ICD-10-CM | POA: Diagnosis not present

## 2012-01-07 DIAGNOSIS — W57XXXA Bitten or stung by nonvenomous insect and other nonvenomous arthropods, initial encounter: Secondary | ICD-10-CM | POA: Diagnosis not present

## 2012-01-15 ENCOUNTER — Telehealth: Payer: Self-pay | Admitting: Endocrinology

## 2012-01-15 DIAGNOSIS — J984 Other disorders of lung: Secondary | ICD-10-CM | POA: Diagnosis not present

## 2012-01-15 DIAGNOSIS — J8410 Pulmonary fibrosis, unspecified: Secondary | ICD-10-CM | POA: Diagnosis not present

## 2012-01-15 DIAGNOSIS — R918 Other nonspecific abnormal finding of lung field: Secondary | ICD-10-CM | POA: Diagnosis not present

## 2012-01-15 NOTE — Telephone Encounter (Signed)
What insulin as you taking now, and how much?

## 2012-01-15 NOTE — Telephone Encounter (Signed)
Caller: Gabriel Alvarez/Patient; PCP: Renato Shin; CB#: [phone]. Call regarding Elevated Blood Sugars. Caller reports he was in the hospital last week for pneumonia. BS were elevated while hospitalized and since he has been home fasting BS have been in the 200's. Reduced by lunch time to nml values of 130 or less. Meds reviewed in EPIC, hospital records/meds are not available to RN. Caller has just completed antibxs but denies any steroids after d/c. Caller reports he is feeling fine and has no problems, but just wanted PCP to be aware of elevated BS's. Advised a note will be sent for MD re same.

## 2012-01-18 NOTE — Telephone Encounter (Signed)
Called pt, no answer/unable to leave message.

## 2012-01-18 NOTE — Telephone Encounter (Signed)
Pt states that he is taking Lantus 110 units q am. He states that his CBG is high in the AM and drops lower throughout the day. Yesterday morning his CBG was in the 200's and it dropped down to 85 around 2pm. This morning his CBG was 310.

## 2012-01-18 NOTE — Telephone Encounter (Signed)
Change insulin to 55-bid

## 2012-01-19 DIAGNOSIS — R0989 Other specified symptoms and signs involving the circulatory and respiratory systems: Secondary | ICD-10-CM | POA: Diagnosis not present

## 2012-01-19 DIAGNOSIS — R0602 Shortness of breath: Secondary | ICD-10-CM | POA: Diagnosis not present

## 2012-01-19 DIAGNOSIS — R0609 Other forms of dyspnea: Secondary | ICD-10-CM | POA: Diagnosis not present

## 2012-01-19 MED ORDER — INSULIN GLARGINE 100 UNIT/ML ~~LOC~~ SOLN: 55.0000 [IU] | Freq: Two times a day (BID) | SUBCUTANEOUS | Status: DC

## 2012-01-19 NOTE — Telephone Encounter (Signed)
Pt informed of MD's advisement regarding insulin adjustment via VM and to callback office with any questions/concerns.

## 2012-01-21 DIAGNOSIS — R0609 Other forms of dyspnea: Secondary | ICD-10-CM | POA: Diagnosis not present

## 2012-01-21 DIAGNOSIS — R0602 Shortness of breath: Secondary | ICD-10-CM | POA: Diagnosis not present

## 2012-01-25 ENCOUNTER — Other Ambulatory Visit: Payer: Self-pay | Admitting: Endocrinology

## 2012-01-28 ENCOUNTER — Ambulatory Visit (INDEPENDENT_AMBULATORY_CARE_PROVIDER_SITE_OTHER): Payer: Managed Care, Other (non HMO) | Admitting: Endocrinology

## 2012-01-28 ENCOUNTER — Encounter: Payer: Self-pay | Admitting: Endocrinology

## 2012-01-28 VITALS — BP 128/62 | HR 77 | Temp 97.3°F | Wt 253.0 lb

## 2012-01-28 DIAGNOSIS — I798 Other disorders of arteries, arterioles and capillaries in diseases classified elsewhere: Secondary | ICD-10-CM

## 2012-01-28 DIAGNOSIS — Z23 Encounter for immunization: Secondary | ICD-10-CM

## 2012-01-28 DIAGNOSIS — E1059 Type 1 diabetes mellitus with other circulatory complications: Secondary | ICD-10-CM

## 2012-01-28 NOTE — Patient Instructions (Addendum)
Please reduce lantus to 50 units, 2x a day.  Please come back for a follow-up appointment in 3 months. check your blood sugar twice a day.  vary the time of day when you check, between before the 3 meals, and at bedtime.  also check if you have symptoms of your blood sugar being too high or too low.  please keep a record of the readings and bring it to your next appointment here.  please call us sooner if your blood sugar goes below 70, or if it stays over 200.

## 2012-01-28 NOTE — Progress Notes (Signed)
  Subjective:    Patient ID: Gabriel Alvarez, male    DOB: [birth year], 61 y.o.   MRN: [id number]  HPI Pt returns for f/u of insulin-requiring DM (dx'ed 1423; complicated by CAD and PAD; he has done better with a simpler insulin schedule).  he brings a record of his cbg's which i have reviewed today.  It varies from 65-200's, but most are in the low-100's.  He says it was well-controlled prior to recent admission for pneumonia.   Past Medical History  Diagnosis Date  . ED (erectile dysfunction)   . DM neuropathy, type II diabetes mellitus   . DIABETES MELLITUS, TYPE II 12/30/2006  . HYPERCHOLESTEROLEMIA 07/30/2008  . GOUT 07/30/2008  . HYPERTENSION 12/30/2006  . CORONARY ARTERY DISEASE 12/30/2006  . CEREBROVASCULAR ACCIDENT, ACUTE 09/05/2008    No past surgical history on file.  History   Social History  . Marital Status: Married    Spouse Name: N/A    Number of Children: N/A  . Years of Education: N/A   Occupational History  .      disabled   Social History Main Topics  . Smoking status: Current Everyday Smoker -- 1.0 packs/day for 30 years    Types: Cigarettes, Cigars  . Smokeless tobacco: Not on file   Comment: has stopped smoking cigarettes since last hospital stay  . Alcohol Use: No  . Drug Use: No  . Sexually Active: Not on file   Other Topics Concern  . Not on file   Social History Narrative  . No narrative on file    Current Outpatient Prescriptions on File Prior to Visit  Medication Sig Dispense Refill  . aspirin 325 MG tablet Take 325 mg by mouth 2 (two) times daily.        . BD INSULIN SYRINGE ULTRAFINE 31G X 5/16" 1 ML MISC USE 2 SYRINGES PER DAY  180 each  2  . clopidogrel (PLAVIX) 75 MG tablet Take 75 mg by mouth daily.        . enalapril (VASOTEC) 20 MG tablet Take 20 mg by mouth daily.        . hydrochlorothiazide 25 MG tablet 1 tablet by mouth once daily      . insulin glargine (LANTUS) 100 UNIT/ML injection Inject 50 Units into the skin 2 (two) times daily.       . metoprolol (TOPROL-XL) 50 MG 24 hr tablet Take 50 mg by mouth daily.        . simvastatin (ZOCOR) 20 MG tablet Take 20 mg by mouth at bedtime.          No Known Allergies  Family History  Problem Relation Age of Onset  . Diabetes Father     BP 128/62  Pulse 77  Temp 97.3 F (36.3 C) (Oral)  Wt 253 lb (114.76 kg)  SpO2 92%  Review of Systems Denies LOC    Objective:   Physical Exam VITAL SIGNS:  See vs page GENERAL: no distress Pulses: dorsalis pedis intact bilat.   Feet: no deformity.  no ulcer on the feet.  feet are of normal color and temp.  no edema Neuro: sensation is intact to touch on the feet     Assessment & Plan:  DM.  therapy limited by pt's need for a simple regimen, but he is doing better on it

## 2012-02-19 DIAGNOSIS — M109 Gout, unspecified: Secondary | ICD-10-CM | POA: Diagnosis not present

## 2012-02-22 DIAGNOSIS — Z7982 Long term (current) use of aspirin: Secondary | ICD-10-CM | POA: Diagnosis not present

## 2012-02-22 DIAGNOSIS — F17200 Nicotine dependence, unspecified, uncomplicated: Secondary | ICD-10-CM | POA: Diagnosis not present

## 2012-02-22 DIAGNOSIS — Z79899 Other long term (current) drug therapy: Secondary | ICD-10-CM | POA: Diagnosis not present

## 2012-02-22 DIAGNOSIS — E119 Type 2 diabetes mellitus without complications: Secondary | ICD-10-CM | POA: Diagnosis not present

## 2012-02-22 DIAGNOSIS — Z7902 Long term (current) use of antithrombotics/antiplatelets: Secondary | ICD-10-CM | POA: Diagnosis not present

## 2012-02-22 DIAGNOSIS — S61409A Unspecified open wound of unspecified hand, initial encounter: Secondary | ICD-10-CM | POA: Diagnosis not present

## 2012-02-22 DIAGNOSIS — I10 Essential (primary) hypertension: Secondary | ICD-10-CM | POA: Diagnosis not present

## 2012-02-22 DIAGNOSIS — Z794 Long term (current) use of insulin: Secondary | ICD-10-CM | POA: Diagnosis not present

## 2012-03-03 DIAGNOSIS — S61409A Unspecified open wound of unspecified hand, initial encounter: Secondary | ICD-10-CM | POA: Diagnosis not present

## 2012-03-03 DIAGNOSIS — L0291 Cutaneous abscess, unspecified: Secondary | ICD-10-CM | POA: Diagnosis not present

## 2012-03-07 ENCOUNTER — Telehealth: Payer: Self-pay | Admitting: Endocrinology

## 2012-03-07 NOTE — Telephone Encounter (Signed)
Forward  7 pages from [facility] to Dr. Renato Shin for review on 03-07-12 ym

## 2012-03-23 DIAGNOSIS — E7800 Pure hypercholesterolemia, unspecified: Secondary | ICD-10-CM | POA: Diagnosis not present

## 2012-03-23 DIAGNOSIS — I10 Essential (primary) hypertension: Secondary | ICD-10-CM | POA: Diagnosis not present

## 2012-03-23 DIAGNOSIS — N400 Enlarged prostate without lower urinary tract symptoms: Secondary | ICD-10-CM | POA: Diagnosis not present

## 2012-04-29 ENCOUNTER — Other Ambulatory Visit: Payer: Self-pay | Admitting: *Deleted

## 2012-04-29 ENCOUNTER — Ambulatory Visit (INDEPENDENT_AMBULATORY_CARE_PROVIDER_SITE_OTHER): Payer: Managed Care, Other (non HMO) | Admitting: Internal Medicine

## 2012-04-29 ENCOUNTER — Encounter: Payer: Self-pay | Admitting: Endocrinology

## 2012-04-29 VITALS — BP 142/88 | HR 74 | Temp 98.3°F | Resp 16 | Wt 257.4 lb

## 2012-04-29 DIAGNOSIS — E1059 Type 1 diabetes mellitus with other circulatory complications: Secondary | ICD-10-CM

## 2012-04-29 NOTE — Patient Instructions (Signed)
Please continue the same Lantus dose. Please return to see [place] in 3 months.  Patient instructions for Diabetes mellitus type 2:  DIET AND EXERCISE Diet and exercise is an important part of diabetic treatment.  We recommended aerobic exercise in the form of brisk walking (working between 40-60% of maximal aerobic capacity, similar to brisk walking) for 150 minutes per week (such as 30 minutes five days per week) along with 3 times per week performing 'resistance' training (using various gauge rubber tubes with handles) 5-10 exercises involving the major muscle groups (upper body, lower body and core) performing 10-15 repetitions (or near fatigue) each exercise. Start at half the above goal but build slowly to reach the above goals. If limited by weight, joint pain, or disability, we recommend daily walking in a swimming pool with water up to waist to reduce pressure from joints while allow for adequate exercise.    BLOOD GLUCOSES Monitoring your blood glucoses is important for continued management of your diabetes. Please continue to check your blood glucoses at least 3 a day: fasting, before meals and, can also test at bedtime.   HYPOGLYCEMIA (low blood sugar) Hypoglycemia is usually a reaction to not eating, exerting yourself too much or using too much insulin.  Symptoms include tremors, sweating, hunger, confusion, headache. Treat IMMEDIATELY with 15 grams of Carbs:   4 glucose tablets    cup regular juice/soda   2 tablespoons raisins   4 teaspoons sugar   1 tablespoon honey Recheck blood glucose in 15 mins and repeat above if still symptomatic/blood glucose <80. Please contact our office at [phone] if you have questions about how to next handle your insulin.  RECOMMENDATIONS TO REDUCE YOUR RISK OF DIABETIC COMPLICATIONS: * Take your prescribed MEDICATION(S). * Follow a DIABETIC diet: Complex carbs, fiber rich foods, heart healthy fish twice weekly, (monounsaturated and  polyunsaturated) fats * AVOID saturated/trans fats, high fat foods, >2,300 mg salt per day. * EXERCISE at least 5 times a week for 30 minutes or preferably daily.  * DO NOT SMOKE OR DRINK more than 1 drink a day. * Check your FEET every day. Do not wear tightfitting shoes. Contact us if you develop an ulcer * See your EYE doctor once a year or more if needed * Get a FLU shot once a year * Get a PNEUMONIA vaccine every 5 years and once after age 54 years  GOALS:  * Your Hemoglobin A1c of 7%  * Your Systolic BP should be 601 or lower  * Your Diastolic BP should be 80 or lower  * Your HDL (Good Cholesterol) should be 40 or higher  * Your LDL (Bad Cholesterol) should be 100 or lower  * Your Triglycerides should be 150 or lower  * Your Urine microalbumin (kidney function) of <30 * Your Body Mass Index should be 25 or lower   We will be glad to help you achieve these goals. Our telephone number is: [phone].

## 2012-04-29 NOTE — Progress Notes (Signed)
Subjective:     Patient ID: Gabriel Alvarez, male   DOB: [birth year], 61 y.o.   MRN: [id number]  HPI Mr. Chiang is a pt. of Dr. Loanne Drilling, who returns for f/u of insulin-requiring DM (dx'ed 8657; complicated by CAD and PAD); he has done better with a simpler insulin schedule).  He brings a record of his cbg's which I have reviewed today.    He takes Lantus 100 units at night, down from 110 units 3 months ago for frequent lows (60-80). He was on a higher dose of Lantus in the past (140 units), but now he does not need that much since he stopped snacking on sweets and eating late at night. He checks sugars 3x a day: - am: 100-160, lowest 60, highest 189 - pre-lunch: 90-184 - pre-dinner: 79-179 He had few lows: 4 this past month, all 60's or above. This happens mostly when he skips a meal.  He has hypoglycemia awareness.  He had higher sugars in the last 2 months because he was sick with PNA (was hospitalized) and then he had a cut on his right hand from loading his truck (had to have ABx for this, now healed).   Last HbA1C: Lab Results  Component Value Date   HGBA1C 7.7* 10/29/2011   Last eye appt was last year (Dr. Dannielle Karvonen, [place]), reportedly without DR. He had cataract surgery. 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   He has a h/o stroke in 2010 when he stopped his Plavix. BP today 142/88 - he says he had bacon for b'fast this am. Pt's PCP checks his Lipids.   He c/o arthritic pain in legs, gout pain in both feet, no CP, occasionally SOB with exertion. No increased thirst, but does drink a lot of water at baseline (>1 gallon a day), no polyuria. No HAs, N/V/D/C. No blurry vision.   He had a flu vaccine and a PNA vaccine this fall.   Past Medical History  Diagnosis Date  . ED (erectile dysfunction)   . DM neuropathy, type II diabetes mellitus   . DIABETES MELLITUS, TYPE II 12/30/2006  . HYPERCHOLESTEROLEMIA 07/30/2008  . GOUT 07/30/2008  . HYPERTENSION 12/30/2006   . CORONARY ARTERY DISEASE 12/30/2006  . CEREBROVASCULAR ACCIDENT, ACUTE 09/05/2008   Past Surgical History  Procedure Date  . Hand sursgery 02/2102    right hand, 8 stitches   History   Social History  . Marital Status: Married    Spouse Name: N/A    Number of Children: N/A  . Years of Education: N/A   Occupational History  .      disabled   Social History Main Topics  . Smoking status: Current Every Day Smoker -- 1.0 packs/day for 30 years    Types: Cigarettes, Cigars  . Smokeless tobacco: Not on file     Comment: has stopped smoking cigarettes since last hospital stay  . Alcohol Use: No  . Drug Use: No  . Sexually Active: Not on file   Other Topics Concern  . Not on file   Social History Narrative  . No narrative on file    Current Outpatient Prescriptions on File Prior to Visit  Medication Sig Dispense Refill  . aspirin 325 MG tablet Take 325 mg by mouth 2 (two) times daily.        . BD INSULIN SYRINGE ULTRAFINE 31G X 5/16" 1 ML MISC USE 2 SYRINGES PER DAY  180 each  2  .  clopidogrel (PLAVIX) 75 MG tablet Take 75 mg by mouth daily.        Marland Kitchen COLCRYS 0.6 MG tablet       . enalapril (VASOTEC) 20 MG tablet Take 20 mg by mouth daily.        Marland Kitchen FREESTYLE LITE test strip       . hydrochlorothiazide 25 MG tablet 1 tablet by mouth once daily      . insulin glargine (LANTUS) 100 UNIT/ML injection Inject 100 Units into the skin daily.       . metoprolol (TOPROL-XL) 50 MG 24 hr tablet Take 50 mg by mouth daily.        . simvastatin (ZOCOR) 20 MG tablet Take 20 mg by mouth at bedtime.          No Known Allergies  Family History  Problem Relation Age of Onset  . Diabetes Father    Review of Systems See HPI    Objective:   Physical Exam  Constitutional: No distress.       overweight  HENT:       Mallampati 4  Eyes: Conjunctivae normal are normal. Pupils are equal, round, and reactive to light.  Neck: No thyromegaly present.       obese  Cardiovascular: Normal  rate and regular rhythm.  Exam reveals no gallop and no friction rub.   No murmur heard. Pulmonary/Chest: Effort normal and breath sounds normal.  Abdominal: Bowel sounds are normal.  Musculoskeletal: He exhibits no edema.  Neurological: He displays normal reflexes.  Skin: Skin is warm and dry.   BP 142/88  Pulse 74  Temp 98.3 F (36.8 C) (Oral)  Resp 16  Wt 257 lb 6.4 oz (116.756 kg)  SpO2 94%     Assessment:     1. DM, insulin dependent, complicated by CVD and PAD - h/o R CVA in 2010 (after he decided to stop Plavix), functionally recovered  - CAD, h/o stents, on Plavix and ASA 2. Hyperlipidemia - on Simvastatin - lipid checks per PCP 3. HTN - on Metoprolol, Enalapril, HCTZ - above goal today    Plan:     1. DM, insulin dependent, complicated by CVD and PAD - reviewed sugar log, overall close to goal, extremes are 60 and 200, but are not frequent. He has lows mostly when misses meals. Most sugars 100-140. - advised to try not to skip meals - advised about healthy meal choices - congratulated pt. Re: his dietary change to avoid concentrated sweets and to not eat late at night - advised to continue to record sugars and bring log at each appt. - we required HbA1C and ACR records from PCP - awaiting the fax. Since pt. affirms that they have been obtained about 4 weeks ago, I will not repeat a HbA1C, but will repeat an ACR since pt. mentions it has been 124 at last check. He is on ACEI (Enalapril) 20 mg daily.  - will continue same regimen for now, of Lantus 100 units qhs since he tolerates it well.  - pt. given handout with targets for BP, lipids, BMI and sugars (see pt. instructions section). 2. HTN - pt above goal today (142/88), especially with his h/o stroke. Advised to reduce salt.   Orders Only on 04/29/2012  Component Date Value Range Status  . Microalb, Ur 04/29/2012 37.21* 0.00 - 1.89 mg/dL Final   Comment: Result confirmed by automatic dilution.  Result repeated and verified.  . Creatinine, Urine 04/29/2012 81.3   Final  . Microalb Creat Ratio 04/29/2012 457.7* 0.0 - 30.0 mg/g Final   Urinary albumin continues to be high. Will need to increase in Enalapril.  I reviewed last available Cr (1.06) and BUN (13) at [facility] labs in 12/2011. I called him and advised him to increase the ACEI to 30 mg daily. He will need to have another BMP in 7-10 days after increasing the dose and would prefer to have this drawn in Dr. Edythe Lynn office. Mychart message with labs also sent.

## 2012-04-30 LAB — MICROALBUMIN / CREATININE URINE RATIO
Creatinine, Urine: 81.3 mg/dL
Microalb, Ur: 37.21 mg/dL — ABNORMAL HIGH (ref 0.00–1.89)

## 2012-05-02 ENCOUNTER — Telehealth: Payer: Self-pay | Admitting: *Deleted

## 2012-05-02 NOTE — Telephone Encounter (Signed)
Encounter opened in error

## 2012-05-09 ENCOUNTER — Telehealth: Payer: Self-pay | Admitting: Endocrinology

## 2012-05-09 NOTE — Telephone Encounter (Signed)
Please ask pcp--i only see pt for DM

## 2012-05-09 NOTE — Telephone Encounter (Signed)
Pt concerned with recent elevated blood pressure readings.  170's/90's- 200/100 This is not normal for him.  He states he is taking his hctz, enalapril and metoprolol.  He is a bit anxious as to why his bp is higher than normal.  He is checking it on a new $100 monitor he recently bought with a large cuff.  He states he has recently cut salt out of his diet.

## 2012-05-09 NOTE — Telephone Encounter (Signed)
Pt advised to contact his pcp, pt states an understanding

## 2012-05-13 DIAGNOSIS — F17200 Nicotine dependence, unspecified, uncomplicated: Secondary | ICD-10-CM | POA: Diagnosis not present

## 2012-05-13 DIAGNOSIS — E7800 Pure hypercholesterolemia, unspecified: Secondary | ICD-10-CM | POA: Diagnosis not present

## 2012-05-13 DIAGNOSIS — I10 Essential (primary) hypertension: Secondary | ICD-10-CM | POA: Diagnosis not present

## 2012-05-18 ENCOUNTER — Telehealth: Payer: Self-pay | Admitting: Endocrinology

## 2012-05-18 DIAGNOSIS — Z794 Long term (current) use of insulin: Secondary | ICD-10-CM | POA: Diagnosis not present

## 2012-05-18 DIAGNOSIS — Z833 Family history of diabetes mellitus: Secondary | ICD-10-CM | POA: Diagnosis not present

## 2012-05-18 DIAGNOSIS — K5289 Other specified noninfective gastroenteritis and colitis: Secondary | ICD-10-CM | POA: Diagnosis not present

## 2012-05-18 DIAGNOSIS — R109 Unspecified abdominal pain: Secondary | ICD-10-CM | POA: Diagnosis not present

## 2012-05-18 DIAGNOSIS — Z79899 Other long term (current) drug therapy: Secondary | ICD-10-CM | POA: Diagnosis not present

## 2012-05-18 DIAGNOSIS — E875 Hyperkalemia: Secondary | ICD-10-CM | POA: Diagnosis not present

## 2012-05-18 DIAGNOSIS — I2510 Atherosclerotic heart disease of native coronary artery without angina pectoris: Secondary | ICD-10-CM | POA: Diagnosis not present

## 2012-05-18 DIAGNOSIS — Z7982 Long term (current) use of aspirin: Secondary | ICD-10-CM | POA: Diagnosis not present

## 2012-05-18 DIAGNOSIS — N179 Acute kidney failure, unspecified: Secondary | ICD-10-CM | POA: Diagnosis not present

## 2012-05-18 DIAGNOSIS — Z9861 Coronary angioplasty status: Secondary | ICD-10-CM | POA: Diagnosis not present

## 2012-05-18 DIAGNOSIS — I252 Old myocardial infarction: Secondary | ICD-10-CM | POA: Diagnosis not present

## 2012-05-18 DIAGNOSIS — E860 Dehydration: Secondary | ICD-10-CM | POA: Diagnosis not present

## 2012-05-18 DIAGNOSIS — F17200 Nicotine dependence, unspecified, uncomplicated: Secondary | ICD-10-CM | POA: Diagnosis not present

## 2012-05-18 DIAGNOSIS — E785 Hyperlipidemia, unspecified: Secondary | ICD-10-CM | POA: Diagnosis not present

## 2012-05-18 DIAGNOSIS — Z7902 Long term (current) use of antithrombotics/antiplatelets: Secondary | ICD-10-CM | POA: Diagnosis not present

## 2012-05-18 DIAGNOSIS — I10 Essential (primary) hypertension: Secondary | ICD-10-CM | POA: Diagnosis not present

## 2012-05-18 DIAGNOSIS — E119 Type 2 diabetes mellitus without complications: Secondary | ICD-10-CM | POA: Diagnosis not present

## 2012-05-18 NOTE — Telephone Encounter (Signed)
Forward 1 page from [facility] to Dr. Renato Shin for review on 05-18-12 ym

## 2012-05-19 DIAGNOSIS — N179 Acute kidney failure, unspecified: Secondary | ICD-10-CM | POA: Diagnosis not present

## 2012-05-19 DIAGNOSIS — E860 Dehydration: Secondary | ICD-10-CM | POA: Diagnosis not present

## 2012-05-19 DIAGNOSIS — R197 Diarrhea, unspecified: Secondary | ICD-10-CM | POA: Diagnosis not present

## 2012-05-20 DIAGNOSIS — R197 Diarrhea, unspecified: Secondary | ICD-10-CM | POA: Diagnosis not present

## 2012-05-20 DIAGNOSIS — E860 Dehydration: Secondary | ICD-10-CM | POA: Diagnosis not present

## 2012-05-20 DIAGNOSIS — N179 Acute kidney failure, unspecified: Secondary | ICD-10-CM | POA: Diagnosis not present

## 2012-05-23 DIAGNOSIS — I10 Essential (primary) hypertension: Secondary | ICD-10-CM | POA: Diagnosis not present

## 2012-05-23 DIAGNOSIS — E860 Dehydration: Secondary | ICD-10-CM | POA: Diagnosis not present

## 2012-05-23 DIAGNOSIS — E875 Hyperkalemia: Secondary | ICD-10-CM | POA: Diagnosis not present

## 2012-05-23 DIAGNOSIS — R197 Diarrhea, unspecified: Secondary | ICD-10-CM | POA: Diagnosis not present

## 2012-06-22 ENCOUNTER — Other Ambulatory Visit: Payer: Self-pay | Admitting: Endocrinology

## 2012-08-01 ENCOUNTER — Ambulatory Visit (INDEPENDENT_AMBULATORY_CARE_PROVIDER_SITE_OTHER): Payer: Managed Care, Other (non HMO) | Admitting: Endocrinology

## 2012-08-01 ENCOUNTER — Encounter: Payer: Self-pay | Admitting: Internal Medicine

## 2012-08-01 VITALS — BP 160/90 | HR 86 | Temp 98.0°F | Resp 16 | Ht 70.0 in | Wt 259.0 lb

## 2012-08-01 DIAGNOSIS — E1159 Type 2 diabetes mellitus with other circulatory complications: Secondary | ICD-10-CM | POA: Diagnosis not present

## 2012-08-01 NOTE — Progress Notes (Signed)
Patient ID: Gabriel Alvarez, male   DOB: 09/14/1950, 62 y.o.   MRN: [id number] Subjective:     Patient ID: Gabriel Alvarez, male   DOB: 09-27-1950, 62 y.o.   MRN: [id number]  HPI Gabriel Alvarez  returns for f/u of insulin-requiring DM2, insulin-dependent, (dx'ed 3664; complicated by CAD, CVD, PAD, ED).   He brings a record of his cbg's which I have reviewed today. At last visit, 3 mo ago, he was taking Lantus 100 units at night, down from 110 units for frequent lows (60-80). He was on a higher dose of Lantus in the past (140 units), but then needed to decrease the dose since he stopped snacking on sweets and eating late at night.   He checks sugars 3x a day: - am: 100-160, lowest 60, highest 189 - pre-lunch: 90-184 - pre-dinner: 79-179 He had few lows: 4 this past month, all 60's or above. This happens mostly when he skips a meal.  He has hypoglycemia awareness.   Last HbA1C was 8% (05/14/2013).  Lab Results  Component Value Date   HGBA1C 7.7* 10/29/2011   Last eye appt was last year (Dr. Dannielle Karvonen, [place]), reportedly without DR. He had cataract surgery. He had microalbuminuria (124 >>458)  And we increased his Enalapril dose at last visit from 20 to 30 mg >> BUN/Cr changed to 22/1.38 from 13/1.06 at [facility] in 12/2011.   He has a h/o stroke in 2010 when he stopped his Plavix. BP today 160/90. Pt's PCP checks his Lipids.   He c/o arthritic pain in legs, gout pain in both feet, no CP, occasionally SOB with exertion. No increased thirst, but does drink a lot of water at baseline (>1 gallon a day), no polyuria. No HAs, N/V/D/C. No blurry vision.   He had a flu vaccine and a PNA vaccine this past fall.   Review of Systems Constitutional: no weight gain/loss, no fatigue, no subjective hyperthermia/hypothermia Eyes: no blurry vision, no xerophthalmia ENT: no sore throat, no nodules palpated in throat, no dysphagia/odynophagia, no hoarseness Cardiovascular: no CP/SOB/palpitations/leg  swelling Respiratory: no cough/SOB Gastrointestinal: no N/V/D/C Musculoskeletal: no muscle/joint aches Skin: no rashes Neurological: no tremors/numbness/tingling/dizziness Psychiatric: no depression/anxiety  I reviewed pt's medications, allergies, PMH, social hx, family hx and no changes required.  Objective:   Physical Exam  Constitutional: No distress.  overweight  HENT:  Mallampati 4  Eyes: Conjunctivae are normal. Pupils are equal, round, and reactive to light.  Neck: No thyromegaly present.  obese  Cardiovascular: Normal rate and regular rhythm.  Exam reveals no gallop and no friction rub.   No murmur heard. Pulmonary/Chest: Effort normal and breath sounds normal.  Abdominal: Bowel sounds are normal.  Musculoskeletal: He exhibits no edema.  Neurological: He displays normal reflexes.  Skin: Skin is warm and dry.   BP 160/90  Pulse 86  Temp(Src) 98 F (36.7 C) (Oral)  Resp 16  Ht _0  (1.778 m)  Wt 259 lb (117.482 kg)  BMI 37.16 kg/m2  SpO2 94%   Assessment:     1. DM, insulin dependent, complicated by CVD and PAD - h/o R CVA in 2010 (after he decided to stop Plavix), functionally recovered  - CAD, h/o stents, on Plavix and ASA  Plan:     1. DM, insulin dependent, complicated by CVD and PAD - reviewed sugar log, overall close to goal, extremes are 60 and 200, but are not frequent. He has lows mostly when misses meals. Most sugars 100-140. - advised to  try not to skip meals - advised about healthy meal choices - congratulated pt. Re: his dietary change to avoid concentrated sweets and to not eat late at night - advised to continue to record sugars and bring log at each appt. - will continue same regimen for now, of Lantus 100 units qhs since he tolerates it well.

## 2012-08-01 NOTE — Progress Notes (Signed)
Subjective:    Patient ID: Gabriel Alvarez, male    DOB: 08-18-1950, 62 y.o.   MRN: [id number]  HPI Pt returns for f/u of insulin-requiring DM (dx'ed 3903; complicated by CAD and PAD; he has done better with a simpler insulin schedule).  no cbg record, but states cbg's vary from 90-200, but most are in the 100's.  pt states he feels well in general, except for chronic arthralgias.   Past Medical History  Diagnosis Date  . ED (erectile dysfunction)   . DM neuropathy, type II diabetes mellitus   . DIABETES MELLITUS, TYPE II 12/30/2006  . HYPERCHOLESTEROLEMIA 07/30/2008  . GOUT 07/30/2008  . HYPERTENSION 12/30/2006  . CORONARY ARTERY DISEASE 12/30/2006  . CEREBROVASCULAR ACCIDENT, ACUTE 09/05/2008    Past Surgical History  Procedure Laterality Date  . Hand sursgery  02/2102    right hand, 8 stitches    History   Social History  . Marital Status: Married    Spouse Name: N/A    Number of Children: N/A  . Years of Education: N/A   Occupational History  .      disabled   Social History Main Topics  . Smoking status: Current Every Day Smoker -- 1.00 packs/day for 30 years    Types: Cigarettes, Cigars  . Smokeless tobacco: Not on file     Comment: has stopped smoking cigarettes since last hospital stay  . Alcohol Use: No  . Drug Use: No  . Sexually Active: Not on file   Other Topics Concern  . Not on file   Social History Narrative  . No narrative on file    Current Outpatient Prescriptions on File Prior to Visit  Medication Sig Dispense Refill  . aspirin 325 MG tablet Take 325 mg by mouth 2 (two) times daily.        . BD INSULIN SYRINGE ULTRAFINE 31G X 5/16" 1 ML MISC USE 2 SYRINGES PER DAY  180 each  2  . clopidogrel (PLAVIX) 75 MG tablet Take 75 mg by mouth daily.        . enalapril (VASOTEC) 20 MG tablet Take 20 mg by mouth daily.        Marland Kitchen FREESTYLE LITE test strip       . hydrochlorothiazide 25 MG tablet 1 tablet by mouth once daily      . insulin glargine (LANTUS) 100  UNIT/ML injection Inject 100 Units into the skin daily.       . metoprolol (TOPROL-XL) 50 MG 24 hr tablet Take 50 mg by mouth daily.        . simvastatin (ZOCOR) 20 MG tablet Take 20 mg by mouth at bedtime.        Marland Kitchen COLCRYS 0.6 MG tablet        No current facility-administered medications on file prior to visit.    No Known Allergies  Family History  Problem Relation Age of Onset  . Diabetes Father     BP 160/90  Pulse 86  Temp(Src) 98 F (36.7 C) (Oral)  Resp 16  Ht _0  (1.778 m)  Wt 259 lb (117.482 kg)  BMI 37.16 kg/m2  SpO2 94%   Review of Systems denies hypoglycemia, except for when he was in the hospital.    Objective:   Physical Exam VITAL SIGNS:  See vs page GENERAL: no distress Pulses: dorsalis pedis intact bilat.   Feet: no deformity.  no ulcer on the feet.  feet are of normal color and temp.  no edema Neuro: sensation is intact to touch on the feet     Assessment & Plan:  DM, apparently well-controlled

## 2012-08-01 NOTE — Patient Instructions (Addendum)
Please continue lantus, 100 units each morning.   Your a1c blood test is not quite due, so please do with dr sasser at your upcoming appt. Please come back for a follow-up appointment in 4 months.  check your blood sugar twice a day.  vary the time of day when you check, between before the 3 meals, and at bedtime.  also check if you have symptoms of your blood sugar being too high or too low.  please keep a record of the readings and bring it to your next appointment here.  please call us sooner if your blood sugar goes below 70, or if it stays over 200.

## 2012-08-03 DIAGNOSIS — IMO0001 Reserved for inherently not codable concepts without codable children: Secondary | ICD-10-CM | POA: Diagnosis not present

## 2012-08-03 DIAGNOSIS — I10 Essential (primary) hypertension: Secondary | ICD-10-CM | POA: Diagnosis not present

## 2012-08-03 DIAGNOSIS — E7800 Pure hypercholesterolemia, unspecified: Secondary | ICD-10-CM | POA: Diagnosis not present

## 2012-08-08 DIAGNOSIS — E7800 Pure hypercholesterolemia, unspecified: Secondary | ICD-10-CM | POA: Diagnosis not present

## 2012-08-08 DIAGNOSIS — F17200 Nicotine dependence, unspecified, uncomplicated: Secondary | ICD-10-CM | POA: Diagnosis not present

## 2012-08-08 DIAGNOSIS — I10 Essential (primary) hypertension: Secondary | ICD-10-CM | POA: Diagnosis not present

## 2012-08-08 DIAGNOSIS — M1990 Unspecified osteoarthritis, unspecified site: Secondary | ICD-10-CM | POA: Diagnosis not present

## 2012-11-27 ENCOUNTER — Other Ambulatory Visit: Payer: Self-pay | Admitting: Endocrinology

## 2012-11-29 ENCOUNTER — Ambulatory Visit (INDEPENDENT_AMBULATORY_CARE_PROVIDER_SITE_OTHER): Payer: Managed Care, Other (non HMO) | Admitting: Endocrinology

## 2012-11-29 ENCOUNTER — Encounter: Payer: Self-pay | Admitting: Endocrinology

## 2012-11-29 VITALS — BP 132/80 | HR 90 | Ht 70.0 in | Wt 259.0 lb

## 2012-11-29 DIAGNOSIS — E1159 Type 2 diabetes mellitus with other circulatory complications: Secondary | ICD-10-CM

## 2012-11-29 NOTE — Patient Instructions (Addendum)
Please continue lantus, 100 units each morning.   please do your a1c blood test with dr sasser at your upcoming appt. Please come back for a follow-up appointment in 4 months.  On this type of insulin schedule, you should eat meals on a regular schedule.  If a meal is missed or significantly delayed, your blood sugar could go low.   check your blood sugar twice a day.  vary the time of day when you check, between before the 3 meals, and at bedtime.  also check if you have symptoms of your blood sugar being too high or too low.  please keep a record of the readings and bring it to your next appointment here.  please call us sooner if your blood sugar goes below 70, or if it stays over 200.

## 2012-11-29 NOTE — Progress Notes (Signed)
Subjective:    Patient ID: Gabriel Alvarez, male    DOB: May 23, 1951, 62 y.o.   MRN: [id number]  HPI Pt returns for f/u of insulin-requiring DM (dx'ed 2003; He has mild if any neuropathy of the lower extremities; he has associated complications CAD and PAD; he has done better with a simpler insulin schedule).  he brings a record of his cbg's which i have reviewed today.  It varies from 64-208, but most are in the 100's.  It is mildly low when a meal is delayed.  Past Medical History  Diagnosis Date  . ED (erectile dysfunction)   . DM neuropathy, type II diabetes mellitus   . DIABETES MELLITUS, TYPE II 12/30/2006  . HYPERCHOLESTEROLEMIA 07/30/2008  . GOUT 07/30/2008  . HYPERTENSION 12/30/2006  . CORONARY ARTERY DISEASE 12/30/2006  . CEREBROVASCULAR ACCIDENT, ACUTE 09/05/2008    Past Surgical History  Procedure Laterality Date  . Hand sursgery  02/2102    right hand, 8 stitches    History   Social History  . Marital Status: Married    Spouse Name: N/A    Number of Children: N/A  . Years of Education: N/A   Occupational History  .      disabled   Social History Main Topics  . Smoking status: Current Every Day Smoker -- 1.00 packs/day for 30 years    Types: Cigarettes, Cigars  . Smokeless tobacco: Not on file     Comment: has stopped smoking cigarettes since last hospital stay  . Alcohol Use: No  . Drug Use: No  . Sexually Active: Not on file   Other Topics Concern  . Not on file   Social History Narrative  . No narrative on file    Current Outpatient Prescriptions on File Prior to Visit  Medication Sig Dispense Refill  . aspirin 325 MG tablet Take 325 mg by mouth 2 (two) times daily.        . BD INSULIN SYRINGE ULTRAFINE 31G X 5/16" 1 ML MISC USE 2 SYRINGES PER DAY  180 each  2  . clopidogrel (PLAVIX) 75 MG tablet Take 75 mg by mouth daily.        Marland Kitchen COLCRYS 0.6 MG tablet       . enalapril (VASOTEC) 20 MG tablet Take 20 mg by mouth daily.        Marland Kitchen FREESTYLE LITE test strip        . hydrochlorothiazide 25 MG tablet 1 tablet by mouth once daily      . insulin glargine (LANTUS) 100 UNIT/ML injection Inject 100 Units into the skin daily.       . metoprolol (TOPROL-XL) 50 MG 24 hr tablet Take 50 mg by mouth daily.        . simvastatin (ZOCOR) 20 MG tablet Take 20 mg by mouth at bedtime.         No current facility-administered medications on file prior to visit.    No Known Allergies  Family History  Problem Relation Age of Onset  . Diabetes Father     BP 132/80  Pulse 90  Ht _0  (1.778 m)  Wt 259 lb (117.482 kg)  BMI 37.16 kg/m2  SpO2 93%  Review of Systems Denies LOC and weight change    Objective:   Physical Exam VITAL SIGNS:  See vs page GENERAL: no distress      Assessment & Plan:  DM: This insulin regimen was chosen from multiple options, for its simplicity.  The  benefits of glycemic control must be weighed against the risks of hypoglycemia.

## 2012-12-06 DIAGNOSIS — E7800 Pure hypercholesterolemia, unspecified: Secondary | ICD-10-CM | POA: Diagnosis not present

## 2012-12-06 DIAGNOSIS — I10 Essential (primary) hypertension: Secondary | ICD-10-CM | POA: Diagnosis not present

## 2012-12-13 DIAGNOSIS — N189 Chronic kidney disease, unspecified: Secondary | ICD-10-CM | POA: Diagnosis not present

## 2012-12-13 DIAGNOSIS — E7800 Pure hypercholesterolemia, unspecified: Secondary | ICD-10-CM | POA: Diagnosis not present

## 2012-12-13 DIAGNOSIS — I10 Essential (primary) hypertension: Secondary | ICD-10-CM | POA: Diagnosis not present

## 2012-12-13 DIAGNOSIS — M1990 Unspecified osteoarthritis, unspecified site: Secondary | ICD-10-CM | POA: Diagnosis not present

## 2012-12-13 DIAGNOSIS — F17200 Nicotine dependence, unspecified, uncomplicated: Secondary | ICD-10-CM | POA: Diagnosis not present

## 2012-12-14 ENCOUNTER — Telehealth: Payer: Self-pay | Admitting: Endocrinology

## 2012-12-14 NOTE — Telephone Encounter (Signed)
please call patient: i have received your a1c result (8.9) Please increase lantus to 110 units qam On this type of insulin schedule, you should eat meals on a regular schedule.  If a meal is missed or significantly delayed, your blood sugar could go low. i'll see you next time.

## 2012-12-14 NOTE — Telephone Encounter (Signed)
Pt advised and states an understanding

## 2013-02-11 DIAGNOSIS — J029 Acute pharyngitis, unspecified: Secondary | ICD-10-CM | POA: Diagnosis not present

## 2013-02-11 DIAGNOSIS — H6690 Otitis media, unspecified, unspecified ear: Secondary | ICD-10-CM | POA: Diagnosis not present

## 2013-02-11 DIAGNOSIS — J209 Acute bronchitis, unspecified: Secondary | ICD-10-CM | POA: Diagnosis not present

## 2013-02-28 ENCOUNTER — Other Ambulatory Visit: Payer: Self-pay | Admitting: Endocrinology

## 2013-04-04 ENCOUNTER — Ambulatory Visit (INDEPENDENT_AMBULATORY_CARE_PROVIDER_SITE_OTHER): Payer: Managed Care, Other (non HMO) | Admitting: Endocrinology

## 2013-04-04 ENCOUNTER — Encounter: Payer: Self-pay | Admitting: Endocrinology

## 2013-04-04 VITALS — BP 130/72 | HR 82 | Wt 258.0 lb

## 2013-04-04 DIAGNOSIS — E1159 Type 2 diabetes mellitus with other circulatory complications: Secondary | ICD-10-CM

## 2013-04-04 DIAGNOSIS — Z23 Encounter for immunization: Secondary | ICD-10-CM

## 2013-04-04 NOTE — Progress Notes (Signed)
Subjective:    Patient ID: Gabriel Alvarez, male    DOB: 1950-10-24, 62 y.o.   MRN: [id number]  HPI Pt returns for f/u of insulin-requiring DM (dx'ed 2003, on a routine blood test; he has mild neuropathy of the lower extremities; he has associated CAD and PAD; he has done better with a simpler QD insulin schedule; he has never had severe hypoglycemia or DKA).  he brings a record of his cbg's which i have reviewed today.  It varies from 53-235, but mst are in the 100's.  There is no trend throughout the day. Past Medical History  Diagnosis Date  . ED (erectile dysfunction)   . DM neuropathy, type II diabetes mellitus   . DIABETES MELLITUS, TYPE II 12/30/2006  . HYPERCHOLESTEROLEMIA 07/30/2008  . GOUT 07/30/2008  . HYPERTENSION 12/30/2006  . CORONARY ARTERY DISEASE 12/30/2006  . CEREBROVASCULAR ACCIDENT, ACUTE 09/05/2008    Past Surgical History  Procedure Laterality Date  . Hand sursgery  02/2102    right hand, 8 stitches    History   Social History  . Marital Status: Married    Spouse Name: N/A    Number of Children: N/A  . Years of Education: N/A   Occupational History  .      disabled   Social History Main Topics  . Smoking status: Current Every Day Smoker -- 1.00 packs/day for 30 years    Types: Cigarettes, Cigars  . Smokeless tobacco: Not on file     Comment: has stopped smoking cigarettes since last hospital stay  . Alcohol Use: No  . Drug Use: No  . Sexual Activity: Not on file   Other Topics Concern  . Not on file   Social History Narrative  . No narrative on file    Current Outpatient Prescriptions on File Prior to Visit  Medication Sig Dispense Refill  . aspirin 325 MG tablet Take 325 mg by mouth 2 (two) times daily.        . BD INSULIN SYRINGE ULTRAFINE 31G X 5/16" 1 ML MISC USE 2 SYRINGES PER DAY  180 each  2  . clopidogrel (PLAVIX) 75 MG tablet Take 75 mg by mouth daily.        Marland Kitchen COLCRYS 0.6 MG tablet       . enalapril (VASOTEC) 20 MG tablet Take 20 mg by  mouth daily.        Marland Kitchen FREESTYLE LITE test strip       . hydrochlorothiazide 25 MG tablet 1 tablet by mouth once daily      . insulin glargine (LANTUS) 100 UNIT/ML injection Inject 110 Units into the skin daily.       . metoprolol (TOPROL-XL) 50 MG 24 hr tablet Take 50 mg by mouth daily.        . simvastatin (ZOCOR) 20 MG tablet Take 20 mg by mouth at bedtime.         No current facility-administered medications on file prior to visit.   No Known Allergies  Family History  Problem Relation Age of Onset  . Diabetes Father    BP 130/72  Pulse 82  Wt 258 lb (117.028 kg)  BMI 37.02 kg/m2  SpO2 88%  Review of Systems Denies LOC, but he has weight gain.    Objective:   Physical Exam VITAL SIGNS:  See vs page GENERAL: no distress     Assessment & Plan:  DM: This insulin regimen was chosen from multiple options, for its simplicity.  The benefits of glycemic control must be weighed against the risks of hypoglycemia.  The pattern of his cbg's indicates he needs some adjustment in his therapy. Cerebrovascular disease: in this context, he should avoid hypoglycemia.

## 2013-04-04 NOTE — Patient Instructions (Addendum)
Please do the a1c at dr sasser's office next Tuesday, as scheduled. If it is high, let's reduce the lantus to 100 units, and add 10 units of humalog with your evening meal. Please continue lantus, 110 units each morning.   Please come back for a follow-up appointment in 3 months.  On this type of insulin schedule, you should eat meals on a regular schedule.  If a meal is missed or significantly delayed, your blood sugar could go low.   check your blood sugar twice a day.  vary the time of day when you check, between before the 3 meals, and at bedtime.  also check if you have symptoms of your blood sugar being too high or too low.  please keep a record of the readings and bring it to your next appointment here.  please call us sooner if your blood sugar goes below 70, or if it stays over 200.

## 2013-04-05 DIAGNOSIS — E875 Hyperkalemia: Secondary | ICD-10-CM | POA: Diagnosis not present

## 2013-04-05 DIAGNOSIS — I10 Essential (primary) hypertension: Secondary | ICD-10-CM | POA: Diagnosis not present

## 2013-04-05 DIAGNOSIS — E7800 Pure hypercholesterolemia, unspecified: Secondary | ICD-10-CM | POA: Diagnosis not present

## 2013-04-12 DIAGNOSIS — I10 Essential (primary) hypertension: Secondary | ICD-10-CM | POA: Diagnosis not present

## 2013-04-12 DIAGNOSIS — R0602 Shortness of breath: Secondary | ICD-10-CM | POA: Diagnosis not present

## 2013-04-12 DIAGNOSIS — M1990 Unspecified osteoarthritis, unspecified site: Secondary | ICD-10-CM | POA: Diagnosis not present

## 2013-04-12 DIAGNOSIS — Z23 Encounter for immunization: Secondary | ICD-10-CM | POA: Diagnosis not present

## 2013-04-12 DIAGNOSIS — E7800 Pure hypercholesterolemia, unspecified: Secondary | ICD-10-CM | POA: Diagnosis not present

## 2013-04-12 DIAGNOSIS — N189 Chronic kidney disease, unspecified: Secondary | ICD-10-CM | POA: Diagnosis not present

## 2013-04-12 DIAGNOSIS — F17200 Nicotine dependence, unspecified, uncomplicated: Secondary | ICD-10-CM | POA: Diagnosis not present

## 2013-04-13 ENCOUNTER — Telehealth: Payer: Self-pay | Admitting: *Deleted

## 2013-04-13 ENCOUNTER — Other Ambulatory Visit: Payer: Self-pay | Admitting: Endocrinology

## 2013-04-13 ENCOUNTER — Telehealth: Payer: Self-pay | Admitting: Endocrinology

## 2013-04-13 MED ORDER — INSULIN LISPRO 100 UNIT/ML (KWIKPEN): 10.0000 [IU] | PEN_INJECTOR | Freq: Every day | SUBCUTANEOUS | Status: DC

## 2013-04-13 NOTE — Telephone Encounter (Signed)
Noted, pt is aware.

## 2013-04-13 NOTE — Telephone Encounter (Signed)
please call patient: i have received a1c result 9.1 let's reduce the lantus to 100 units, and add 10 units of humalog with your evening meal.

## 2013-04-14 NOTE — Telephone Encounter (Signed)
Pt advised.

## 2013-07-05 ENCOUNTER — Ambulatory Visit: Payer: Managed Care, Other (non HMO) | Admitting: Endocrinology

## 2013-07-07 ENCOUNTER — Ambulatory Visit (INDEPENDENT_AMBULATORY_CARE_PROVIDER_SITE_OTHER): Payer: Managed Care, Other (non HMO) | Admitting: Endocrinology

## 2013-07-07 ENCOUNTER — Encounter: Payer: Self-pay | Admitting: Endocrinology

## 2013-07-07 VITALS — BP 138/90 | HR 80 | Temp 98.4°F | Ht 70.0 in | Wt 259.0 lb

## 2013-07-07 DIAGNOSIS — E1159 Type 2 diabetes mellitus with other circulatory complications: Secondary | ICD-10-CM | POA: Diagnosis not present

## 2013-07-07 LAB — HEMOGLOBIN A1C: Hgb A1c MFr Bld: 7.7 % — ABNORMAL HIGH (ref 4.6–6.5)

## 2013-07-07 NOTE — Patient Instructions (Signed)
blood tests are being requested for you today.  We'll contact you with results. Please come back for a follow-up appointment in 3 months.  On this type of insulin schedule, you should eat meals on a regular schedule.  If a meal is missed or significantly delayed, your blood sugar could go low.   check your blood sugar twice a day.  vary the time of day when you check, between before the 3 meals, and at bedtime.  also check if you have symptoms of your blood sugar being too high or too low.  please keep a record of the readings and bring it to your next appointment here.  please call us sooner if your blood sugar goes below 70, or if it stays over 200.

## 2013-07-07 NOTE — Progress Notes (Signed)
Subjective:    Patient ID: Gabriel Alvarez, male    DOB: 04/22/1951, 63 y.o.   MRN: [id number]  HPI Pt returns for f/u of insulin-requiring DM (dx'ed 2003, on a routine blood test; he has mild neuropathy of the lower extremities; he has associated CAD and PAD; he has done better with a simpler QD insulin schedule; he has never had severe hypoglycemia or DKA).  he brings a record of his cbg's which i have reviewed today.  It varies from 70-200, but most are in the 100's.  There is no trend throughout the day.  pt states he feels well in general. Past Medical History  Diagnosis Date  . ED (erectile dysfunction)   . DM neuropathy, type II diabetes mellitus   . DIABETES MELLITUS, TYPE II 12/30/2006  . HYPERCHOLESTEROLEMIA 07/30/2008  . GOUT 07/30/2008  . HYPERTENSION 12/30/2006  . CORONARY ARTERY DISEASE 12/30/2006  . CEREBROVASCULAR ACCIDENT, ACUTE 09/05/2008    Past Surgical History  Procedure Laterality Date  . Hand sursgery  02/2102    right hand, 8 stitches    History   Social History  . Marital Status: Married    Spouse Name: N/A    Number of Children: N/A  . Years of Education: N/A   Occupational History  .      disabled   Social History Main Topics  . Smoking status: Current Every Day Smoker -- 1.00 packs/day for 30 years    Types: Cigarettes, Cigars  . Smokeless tobacco: Not on file     Comment: has stopped smoking cigarettes since last hospital stay  . Alcohol Use: No  . Drug Use: No  . Sexual Activity: Not on file   Other Topics Concern  . Not on file   Social History Narrative  . No narrative on file    Current Outpatient Prescriptions on File Prior to Visit  Medication Sig Dispense Refill  . aspirin 325 MG tablet Take 325 mg by mouth 2 (two) times daily.        . BD INSULIN SYRINGE ULTRAFINE 31G X 5/16" 1 ML MISC USE 2 SYRINGES PER DAY  180 each  2  . clopidogrel (PLAVIX) 75 MG tablet Take 75 mg by mouth daily.        Marland Kitchen COLCRYS 0.6 MG tablet       . enalapril  (VASOTEC) 20 MG tablet Take 20 mg by mouth daily.        Marland Kitchen FREESTYLE LITE test strip       . hydrochlorothiazide 25 MG tablet 1 tablet by mouth once daily      . insulin glargine (LANTUS) 100 UNIT/ML injection Inject 100 Units into the skin daily.       . insulin lispro (HUMALOG KWIKPEN) 100 UNIT/ML SOPN Inject 10 Units into the skin daily with supper. And pen needles 1/day  5 pen  11  . metoprolol (TOPROL-XL) 50 MG 24 hr tablet Take 50 mg by mouth daily.        . simvastatin (ZOCOR) 20 MG tablet Take 20 mg by mouth at bedtime.        . traZODone (DESYREL) 50 MG tablet Take 50 mg by mouth at bedtime.       No current facility-administered medications on file prior to visit.    No Known Allergies  Family History  Problem Relation Age of Onset  . Diabetes Father     BP 138/90  Pulse 80  Temp(Src) 98.4 F (36.9 C) (  Oral)  Ht _0  (1.778 m)  Wt 259 lb (117.482 kg)  BMI 37.16 kg/m2  SpO2 90%  Review of Systems denies hypoglycemia and weight change.  He says knee pain limits exercise rx.    Objective:   Physical Exam VITAL SIGNS:  See vs page GENERAL: no distress      Assessment & Plan:  DM: therapy limited by pt's need for a simple regimen.  However, control seems to be improved CAD: in this setting, he should avoid hypoglycemia. Knee pain: possibly due to gout.  This limits exercise rx of DM.  Pt says he will soon see Dr Quintin Alto for this.

## 2013-07-10 LAB — MICROALBUMIN / CREATININE URINE RATIO
CREATININE, U: 60.7 mg/dL
MICROALB/CREAT RATIO: 196.4 mg/g — AB (ref 0.0–30.0)
Microalb, Ur: 119.1 mg/dL — ABNORMAL HIGH (ref 0.0–1.9)

## 2013-09-15 ENCOUNTER — Ambulatory Visit (INDEPENDENT_AMBULATORY_CARE_PROVIDER_SITE_OTHER): Payer: Managed Care, Other (non HMO) | Admitting: Endocrinology

## 2013-09-15 ENCOUNTER — Encounter: Payer: Self-pay | Admitting: Endocrinology

## 2013-09-15 VITALS — BP 132/82 | HR 83 | Temp 98.1°F | Ht 70.0 in | Wt 261.0 lb

## 2013-09-15 DIAGNOSIS — E1159 Type 2 diabetes mellitus with other circulatory complications: Secondary | ICD-10-CM | POA: Diagnosis not present

## 2013-09-15 LAB — HEMOGLOBIN A1C: HEMOGLOBIN A1C: 8.3 % — AB (ref 4.6–6.5)

## 2013-09-15 NOTE — Progress Notes (Signed)
Subjective:    Patient ID: Gabriel Alvarez, male    DOB: 07-26-1950, 63 y.o.   MRN: [id number]  HPI Pt returns for f/u of insulin-requiring DM (dx'ed 2003, on a routine blood test; he has mild neuropathy of the lower extremities; he has associated CAD and PAD; in 2010, he changed to a simpler QD insulin schedule, after poor results on multiple daily injections; he has never had severe hypoglycemia or DKA).  no cbg record, but states cbg's are well-controlled. It is highest in the afternoon, and lowest in am.  pt states he feels well in general. Past Medical History  Diagnosis Date  . ED (erectile dysfunction)   . DM neuropathy, type II diabetes mellitus   . DIABETES MELLITUS, TYPE II 12/30/2006  . HYPERCHOLESTEROLEMIA 07/30/2008  . GOUT 07/30/2008  . HYPERTENSION 12/30/2006  . CORONARY ARTERY DISEASE 12/30/2006  . CEREBROVASCULAR ACCIDENT, ACUTE 09/05/2008    Past Surgical History  Procedure Laterality Date  . Hand sursgery  02/2102    right hand, 8 stitches    History   Social History  . Marital Status: Married    Spouse Name: N/A    Number of Children: N/A  . Years of Education: N/A   Occupational History  .      disabled   Social History Main Topics  . Smoking status: Current Every Day Smoker -- 1.00 packs/day for 30 years    Types: Cigarettes, Cigars  . Smokeless tobacco: Not on file     Comment: has stopped smoking cigarettes since last hospital stay  . Alcohol Use: No  . Drug Use: No  . Sexual Activity: Not on file   Other Topics Concern  . Not on file   Social History Narrative  . No narrative on file    Current Outpatient Prescriptions on File Prior to Visit  Medication Sig Dispense Refill  . aspirin 325 MG tablet Take 325 mg by mouth 2 (two) times daily.        . BD INSULIN SYRINGE ULTRAFINE 31G X 5/16" 1 ML MISC USE 2 SYRINGES PER DAY  180 each  2  . BD ULTRA-FINE PEN NEEDLES 29G X 12.7MM MISC       . clopidogrel (PLAVIX) 75 MG tablet Take 75 mg by mouth  daily.        Marland Kitchen COLCRYS 0.6 MG tablet       . enalapril (VASOTEC) 20 MG tablet Take 20 mg by mouth daily.        Marland Kitchen FREESTYLE LITE test strip       . hydrochlorothiazide 25 MG tablet 1 tablet by mouth once daily      . insulin glargine (LANTUS) 100 UNIT/ML injection Inject 110 Units into the skin daily.       . insulin lispro (HUMALOG KWIKPEN) 100 UNIT/ML SOPN Inject 10 Units into the skin daily with supper. And pen needles 1/day  5 pen  11  . metoprolol (TOPROL-XL) 50 MG 24 hr tablet Take 50 mg by mouth daily.        . simvastatin (ZOCOR) 20 MG tablet Take 20 mg by mouth at bedtime.        . traZODone (DESYREL) 50 MG tablet Take 50 mg by mouth at bedtime.       No current facility-administered medications on file prior to visit.   No Known Allergies  Family History  Problem Relation Age of Onset  . Diabetes Father    BP 132/82  Pulse  83  Temp(Src) 98.1 F (36.7 C) (Oral)  Ht _0  (1.778 m)  Wt 261 lb (118.389 kg)  BMI 37.45 kg/m2  Review of Systems He denies hypoglycemia, but he has gained weight.     Objective:   Physical Exam VITAL SIGNS:  See vs page GENERAL: no distress SKIN:  Insulin injection sites at the anterior abdomen are normal.    Lab Results  Component Value Date   HGBA1C 8.3* 09/15/2013      Assessment & Plan:  DM: therapy limited by pt's need for a simple regimen.  He needs increased rx. CAD: in this setting, he should avoid hypoglycemia.

## 2013-09-15 NOTE — Patient Instructions (Addendum)
blood tests are being requested for you today.  We'll contact you with results. Please come back for a follow-up appointment in 3 months.  On this type of insulin schedule, you should eat meals on a regular schedule.  If a meal is missed or significantly delayed, your blood sugar could go low.   check your blood sugar twice a day.  vary the time of day when you check, between before the 3 meals, and at bedtime.  also check if you have symptoms of your blood sugar being too high or too low.  please keep a record of the readings and bring it to your next appointment here.  please call us sooner if your blood sugar goes below 70, or if it stays over 200.

## 2013-10-07 DIAGNOSIS — M109 Gout, unspecified: Secondary | ICD-10-CM | POA: Diagnosis not present

## 2013-10-07 DIAGNOSIS — M25569 Pain in unspecified knee: Secondary | ICD-10-CM | POA: Diagnosis not present

## 2013-12-19 ENCOUNTER — Encounter: Payer: Self-pay | Admitting: Endocrinology

## 2013-12-19 ENCOUNTER — Ambulatory Visit (INDEPENDENT_AMBULATORY_CARE_PROVIDER_SITE_OTHER): Payer: Managed Care, Other (non HMO) | Admitting: Endocrinology

## 2013-12-19 VITALS — BP 126/84 | HR 69 | Temp 97.9°F | Ht 70.0 in | Wt 260.0 lb

## 2013-12-19 DIAGNOSIS — E1159 Type 2 diabetes mellitus with other circulatory complications: Secondary | ICD-10-CM | POA: Diagnosis not present

## 2013-12-19 NOTE — Progress Notes (Signed)
Subjective:    Patient ID: Gabriel Alvarez, male    DOB: 02/14/1951, 63 y.o.   MRN: [id number]  HPI Pt returns for f/u of insulin-requiring DM (dx'ed 2003, on a routine blood test; he has mild neuropathy of the lower extremities; he has associated CAD and PAD; in 2010, he changed to a simpler QD insulin schedule, after poor results on multiple daily injections; he has never had pancreatitis, severe hypoglycemia, or DKA).  pt states he feels well in general.  no cbg record, but states cbg's are frequently mildly low in the afternoon.  It is highest at hs.   Past Medical History  Diagnosis Date  . ED (erectile dysfunction)   . DM neuropathy, type II diabetes mellitus   . DIABETES MELLITUS, TYPE II 12/30/2006  . HYPERCHOLESTEROLEMIA 07/30/2008  . GOUT 07/30/2008  . HYPERTENSION 12/30/2006  . CORONARY ARTERY DISEASE 12/30/2006  . CEREBROVASCULAR ACCIDENT, ACUTE 09/05/2008    Past Surgical History  Procedure Laterality Date  . Hand sursgery  02/2102    right hand, 8 stitches    History   Social History  . Marital Status: Married    Spouse Name: N/A    Number of Children: N/A  . Years of Education: N/A   Occupational History  .      disabled   Social History Main Topics  . Smoking status: Current Every Day Smoker -- 1.00 packs/day for 30 years    Types: Cigarettes, Cigars  . Smokeless tobacco: Not on file     Comment: has stopped smoking cigarettes since last hospital stay  . Alcohol Use: No  . Drug Use: No  . Sexual Activity: Not on file   Other Topics Concern  . Not on file   Social History Narrative  . No narrative on file    Current Outpatient Prescriptions on File Prior to Visit  Medication Sig Dispense Refill  . aspirin 325 MG tablet Take 325 mg by mouth 2 (two) times daily.        . BD INSULIN SYRINGE ULTRAFINE 31G X 5/16" 1 ML MISC USE 2 SYRINGES PER DAY  180 each  2  . BD ULTRA-FINE PEN NEEDLES 29G X 12.7MM MISC       . clopidogrel (PLAVIX) 75 MG tablet Take 75 mg  by mouth daily.        Marland Kitchen COLCRYS 0.6 MG tablet       . enalapril (VASOTEC) 20 MG tablet Take 20 mg by mouth daily.        Marland Kitchen FREESTYLE LITE test strip       . hydrochlorothiazide 25 MG tablet 1 tablet by mouth once daily      . insulin glargine (LANTUS) 100 UNIT/ML injection Inject 90 Units into the skin daily.       . metoprolol (TOPROL-XL) 50 MG 24 hr tablet Take 50 mg by mouth daily.        . simvastatin (ZOCOR) 20 MG tablet Take 20 mg by mouth at bedtime.        . traZODone (DESYREL) 50 MG tablet Take 50 mg by mouth at bedtime.       No current facility-administered medications on file prior to visit.    No Known Allergies  Family History  Problem Relation Age of Onset  . Diabetes Father     BP 126/84  Pulse 69  Temp(Src) 97.9 F (36.6 C) (Oral)  Ht _0  (1.778 m)  Wt 260 lb (117.935 kg)  BMI 37.31 kg/m2  SpO2 90%  Review of Systems He denies weight change and LOC.    Objective:   Physical Exam Pulses: dorsalis pedis intact bilat.   Feet: no deformity. normal color and temp.  no edema Skin:  no ulcer on the feet.   Neuro: sensation is intact to touch on the feet.   outside test results are reviewed:   A1c=7.3    Assessment & Plan:  DM: this is the best control this pt should aim for, given this regimen, which does match insulin to her changing needs throughout the day.  The pattern of his cbg's indicates he needs some adjustment in his therapy. CAD and CVA: in this setting, he should avoid hypoglycemia. This impairs the ability to achieve glycemic control.  I'll work around this as best I can.  Smoker: this vastly accelerates the complications of DM, but he says he can't quit.  Patient is advised the following: Patient Instructions  Please reduce the lantus to 90 units each morning, and increase the humalog to 20 units daily, with the evening meal.   Please come back for a follow-up appointment in 3 months.  On this type of insulin schedule, you should eat  meals on a regular schedule.  If a meal is missed or significantly delayed, your blood sugar could go low.   check your blood sugar twice a day.  vary the time of day when you check, between before the 3 meals, and at bedtime.  also check if you have symptoms of your blood sugar being too high or too low.  please keep a record of the readings and bring it to your next appointment here.  please call us sooner if your blood sugar goes below 70, or if it stays over 200.

## 2013-12-19 NOTE — Patient Instructions (Addendum)
Please reduce the lantus to 90 units each morning, and increase the humalog to 20 units daily, with the evening meal.   Please come back for a follow-up appointment in 3 months.  On this type of insulin schedule, you should eat meals on a regular schedule.  If a meal is missed or significantly delayed, your blood sugar could go low.   check your blood sugar twice a day.  vary the time of day when you check, between before the 3 meals, and at bedtime.  also check if you have symptoms of your blood sugar being too high or too low.  please keep a record of the readings and bring it to your next appointment here.  please call us sooner if your blood sugar goes below 70, or if it stays over 200.

## 2014-02-27 ENCOUNTER — Telehealth: Payer: Self-pay | Admitting: Endocrinology

## 2014-02-27 MED ORDER — INSULIN LISPRO 100 UNIT/ML (KWIKPEN): 20.0000 [IU] | PEN_INJECTOR | Freq: Every day | SUBCUTANEOUS | Status: DC

## 2014-02-27 NOTE — Telephone Encounter (Signed)
Patient need [facility]

## 2014-02-27 NOTE — Telephone Encounter (Signed)
Rx sent to pharmacy   

## 2014-03-05 ENCOUNTER — Other Ambulatory Visit: Payer: Self-pay

## 2014-03-05 MED ORDER — GLUCOSE BLOOD VI STRP: ORAL_STRIP | Status: DC

## 2014-03-21 ENCOUNTER — Ambulatory Visit (INDEPENDENT_AMBULATORY_CARE_PROVIDER_SITE_OTHER): Payer: Managed Care, Other (non HMO) | Admitting: Endocrinology

## 2014-03-21 ENCOUNTER — Encounter: Payer: Self-pay | Admitting: Endocrinology

## 2014-03-21 VITALS — BP 138/83 | HR 68 | Temp 98.6°F | Ht 70.0 in | Wt 261.0 lb

## 2014-03-21 DIAGNOSIS — Z23 Encounter for immunization: Secondary | ICD-10-CM

## 2014-03-21 DIAGNOSIS — I798 Other disorders of arteries, arterioles and capillaries in diseases classified elsewhere: Secondary | ICD-10-CM | POA: Diagnosis not present

## 2014-03-21 DIAGNOSIS — E1151 Type 2 diabetes mellitus with diabetic peripheral angiopathy without gangrene: Secondary | ICD-10-CM

## 2014-03-21 DIAGNOSIS — E1159 Type 2 diabetes mellitus with other circulatory complications: Secondary | ICD-10-CM

## 2014-03-21 NOTE — Progress Notes (Signed)
Subjective:    Patient ID: Gabriel Alvarez, male    DOB: 1951-02-17, 63 y.o.   MRN: [id number]  HPI Pt returns for f/u of diabetes mellitus: DM type: insulin-requiring type 2 Dx'ed: 6948 Complications: sensory neuropathy, CAD, and PAD Therapy: BID insulin DKA: never Severe hypoglycemia: never Pancreatitis: never Other: in 2010, he changed to a simpler insulin schedule, after poor results on multiple daily injections Interval history: no cbg record, but states cbg's are well-controlled.  There is no trend throughout the day.  pt states he feels well in general.   Past Medical History  Diagnosis Date  . ED (erectile dysfunction)   . DM neuropathy, type II diabetes mellitus   . DIABETES MELLITUS, TYPE II 12/30/2006  . HYPERCHOLESTEROLEMIA 07/30/2008  . GOUT 07/30/2008  . HYPERTENSION 12/30/2006  . CORONARY ARTERY DISEASE 12/30/2006  . CEREBROVASCULAR ACCIDENT, ACUTE 09/05/2008    Past Surgical History  Procedure Laterality Date  . Hand sursgery  02/2102    right hand, 8 stitches    History   Social History  . Marital Status: Married    Spouse Name: N/A    Number of Children: N/A  . Years of Education: N/A   Occupational History  .      disabled   Social History Main Topics  . Smoking status: Current Every Day Smoker -- 1.00 packs/day for 30 years    Types: Cigarettes, Cigars  . Smokeless tobacco: Not on file     Comment: has stopped smoking cigarettes since last hospital stay  . Alcohol Use: No  . Drug Use: No  . Sexual Activity: Not on file   Other Topics Concern  . Not on file   Social History Narrative  . No narrative on file    Current Outpatient Prescriptions on File Prior to Visit  Medication Sig Dispense Refill  . aspirin 325 MG tablet Take 325 mg by mouth 2 (two) times daily.        . BD INSULIN SYRINGE ULTRAFINE 31G X 5/16" 1 ML MISC USE 2 SYRINGES PER DAY  180 each  2  . BD ULTRA-FINE PEN NEEDLES 29G X 12.7MM MISC       . clopidogrel (PLAVIX) 75 MG  tablet Take 75 mg by mouth daily.        Marland Kitchen COLCRYS 0.6 MG tablet       . enalapril (VASOTEC) 20 MG tablet Take 20 mg by mouth daily.        Marland Kitchen glucose blood (ONETOUCH VERIO) test strip Use to check blood sugar 2 per day.  200 each  1  . hydrochlorothiazide 25 MG tablet 1 tablet by mouth once daily      . insulin glargine (LANTUS) 100 UNIT/ML injection Inject 90 Units into the skin daily.       . insulin lispro (HUMALOG) 100 UNIT/ML KiwkPen Inject 0.2 mLs (20 Units total) into the skin daily with supper. And pen needles 1/day  30 mL  1  . metoprolol (TOPROL-XL) 50 MG 24 hr tablet Take 50 mg by mouth daily.        . simvastatin (ZOCOR) 20 MG tablet Take 20 mg by mouth at bedtime.        . traZODone (DESYREL) 50 MG tablet Take 50 mg by mouth at bedtime.       No current facility-administered medications on file prior to visit.    No Known Allergies  Family History  Problem Relation Age of Onset  . Diabetes  Father     BP 138/83  Pulse 68  Temp(Src) 98.6 F (37 C) (Oral)  Ht _0  (1.778 m)  Wt 261 lb (118.389 kg)  BMI 37.45 kg/m2  SpO2 91%    Review of Systems He denies hypoglycemia.  He has gained weight.      Objective:   Physical Exam VITAL SIGNS:  See vs page GENERAL: no distress Pulses: dorsalis pedis intact bilat.   Feet: no deformity.  no edema Skin:  no ulcer on the feet.  normal color and temp.  Neuro: sensation is intact to touch on the feet, but decreased from normal.    outside test results are reviewed: 01/03/14 (a1c)    Assessment & Plan:  DM: apparently well-controlled. Noncompliance with cbg recording: I'll work around this as best I can.   CAD: in this context, we need to avoid hypoglycemia, and he is not a candidate for a1c <7%   Patient is advised the following: Patient Instructions  Please continue lantus, 90 units each morning, and humalog, 20 units daily, with the evening meal.   Please come back for a follow-up appointment in January.   On  this type of insulin schedule, you should eat meals on a regular schedule.  If a meal is missed or significantly delayed, your blood sugar could go low.   check your blood sugar twice a day.  vary the time of day when you check, between before the 3 meals, and at bedtime.  also check if you have symptoms of your blood sugar being too high or too low.  please keep a record of the readings and bring it to your next appointment here.  please call us sooner if your blood sugar goes below 70, or if it stays over 200.

## 2014-03-21 NOTE — Patient Instructions (Signed)
Please continue lantus, 90 units each morning, and humalog, 20 units daily, with the evening meal.   Please come back for a follow-up appointment in January.   On this type of insulin schedule, you should eat meals on a regular schedule.  If a meal is missed or significantly delayed, your blood sugar could go low.   check your blood sugar twice a day.  vary the time of day when you check, between before the 3 meals, and at bedtime.  also check if you have symptoms of your blood sugar being too high or too low.  please keep a record of the readings and bring it to your next appointment here.  please call us sooner if your blood sugar goes below 70, or if it stays over 200.

## 2014-05-07 ENCOUNTER — Other Ambulatory Visit: Payer: Self-pay | Admitting: Endocrinology

## 2014-07-16 ENCOUNTER — Ambulatory Visit: Payer: Managed Care, Other (non HMO) | Admitting: Endocrinology

## 2014-07-18 ENCOUNTER — Ambulatory Visit: Payer: Managed Care, Other (non HMO) | Admitting: Endocrinology

## 2014-08-07 ENCOUNTER — Ambulatory Visit (INDEPENDENT_AMBULATORY_CARE_PROVIDER_SITE_OTHER): Payer: Managed Care, Other (non HMO) | Admitting: Endocrinology

## 2014-08-07 ENCOUNTER — Encounter: Payer: Self-pay | Admitting: Endocrinology

## 2014-08-07 VITALS — BP 134/92 | HR 77 | Temp 98.3°F | Ht 70.0 in | Wt 277.0 lb

## 2014-08-07 DIAGNOSIS — E1151 Type 2 diabetes mellitus with diabetic peripheral angiopathy without gangrene: Secondary | ICD-10-CM

## 2014-08-07 DIAGNOSIS — I739 Peripheral vascular disease, unspecified: Secondary | ICD-10-CM

## 2014-08-07 DIAGNOSIS — E1159 Type 2 diabetes mellitus with other circulatory complications: Secondary | ICD-10-CM

## 2014-08-07 LAB — MICROALBUMIN / CREATININE URINE RATIO
CREATININE, U: 52.2 mg/dL
MICROALB UR: 272.6 mg/dL — AB (ref 0.0–1.9)
Microalb Creat Ratio: 522 mg/g — ABNORMAL HIGH (ref 0.0–30.0)

## 2014-08-07 LAB — HEMOGLOBIN A1C: HEMOGLOBIN A1C: 8.5 % — AB (ref 4.6–6.5)

## 2014-08-07 NOTE — Progress Notes (Signed)
Subjective:    Patient ID: Gabriel Alvarez, male    DOB: 06-08-1951, 64 y.o.   MRN: [id number]  HPI Pt returns for f/u of diabetes mellitus: DM type: insulin-requiring type 2 Dx'ed: 1460 Complications: sensory neuropathy, nephropathy, CAD, and PAD Therapy: BID insulin DKA: never Severe hypoglycemia: never Pancreatitis: never Other: in 2010, he changed to a simpler insulin schedule, after poor results on multiple daily injections.  Interval history: no cbg record, but states cbg's vary from 85-190.  It is in general higher as the day goes on.  It is higher at hs than in am.   Past Medical History  Diagnosis Date  . ED (erectile dysfunction)   . DM neuropathy, type II diabetes mellitus   . DIABETES MELLITUS, TYPE II 12/30/2006  . HYPERCHOLESTEROLEMIA 07/30/2008  . GOUT 07/30/2008  . HYPERTENSION 12/30/2006  . CORONARY ARTERY DISEASE 12/30/2006  . CEREBROVASCULAR ACCIDENT, ACUTE 09/05/2008    Past Surgical History  Procedure Laterality Date  . Hand sursgery  02/2102    right hand, 8 stitches    History   Social History  . Marital Status: Married    Spouse Name: N/A  . Number of Children: N/A  . Years of Education: N/A   Occupational History  .      disabled   Social History Main Topics  . Smoking status: Current Every Day Smoker -- 1.00 packs/day for 30 years    Types: Cigarettes, Cigars  . Smokeless tobacco: Not on file     Comment: has stopped smoking cigarettes since last hospital stay  . Alcohol Use: No  . Drug Use: No  . Sexual Activity: Not on file   Other Topics Concern  . Not on file   Social History Narrative    Current Outpatient Prescriptions on File Prior to Visit  Medication Sig Dispense Refill  . aspirin 325 MG tablet Take 325 mg by mouth 2 (two) times daily.      . BD INSULIN SYRINGE ULTRAFINE 31G X 5/16" 1 ML MISC USE 2 SYRINGES PER DAY 180 each 2  . BD ULTRA-FINE PEN NEEDLES 29G X 12.7MM MISC USE 1 PER DAY 1 each 10  . clopidogrel (PLAVIX) 75 MG  tablet Take 75 mg by mouth daily.      Marland Kitchen COLCRYS 0.6 MG tablet     . enalapril (VASOTEC) 20 MG tablet Take 20 mg by mouth daily.      Marland Kitchen glucose blood (ONETOUCH VERIO) test strip Use to check blood sugar 2 per day. 200 each 1  . hydrochlorothiazide 25 MG tablet 1 tablet by mouth once daily    . insulin glargine (LANTUS) 100 UNIT/ML injection Inject 85 Units into the skin daily.     . insulin lispro (HUMALOG) 100 UNIT/ML KiwkPen Inject 0.2 mLs (20 Units total) into the skin daily with supper. And pen needles 1/day (Patient taking differently: Inject 30 Units into the skin daily with supper. And pen needles 1/day) 30 mL 1  . metoprolol (TOPROL-XL) 50 MG 24 hr tablet Take 50 mg by mouth daily.      . simvastatin (ZOCOR) 20 MG tablet Take 20 mg by mouth at bedtime.      . traZODone (DESYREL) 50 MG tablet Take 50 mg by mouth at bedtime.     No current facility-administered medications on file prior to visit.   No Known Allergies  Family History  Problem Relation Age of Onset  . Diabetes Father    BP 134/92 mmHg  Pulse 77  Temp(Src) 98.3 F (36.8 C) (Oral)  Ht _0  (1.778 m)  Wt 277 lb (125.646 kg)  BMI 39.75 kg/m2  SpO2 91%  Review of Systems He has gained weight.  He denies hypoglycemia.       Objective:   Physical Exam VITAL SIGNS:  See vs page GENERAL: no distress Pulses: dorsalis pedis intact bilat.   MSK: no deformity of the feet CV: 1+ bilat leg edema Skin:  no ulcer on the feet.  normal color and temp on the feet. Neuro: sensation is intact to touch on the feet   Lab Results  Component Value Date   HGBA1C 8.5* 08/07/2014       Assessment & Plan:  DM: moderate exacerbation Morbid obesity, worse.  Patient is advised the following: Patient Instructions  blood and urine tests are being requested for you today.  We'll let you know about the results.   Please come back for a follow-up appointment in 3 months.   On this type of insulin schedule, you should eat  meals on a regular schedule.  If a meal is missed or significantly delayed, your blood sugar could go low.   check your blood sugar twice a day.  vary the time of day when you check, between before the 3 meals, and at bedtime.  also check if you have symptoms of your blood sugar being too high or too low.  please keep a record of the readings and bring it to your next appointment here.  please call us sooner if your blood sugar goes below 70, or if it stays over 200.   Please see a weight-loss surgery specialist.  you will receive a phone call, about a day and time for an appointment.    reduce the lantus to 85 units daily, and increase the humalog to 30 units, with the evening meal

## 2014-08-07 NOTE — Patient Instructions (Addendum)
blood and urine tests are being requested for you today.  We'll let you know about the results.   Please come back for a follow-up appointment in 3 months.   On this type of insulin schedule, you should eat meals on a regular schedule.  If a meal is missed or significantly delayed, your blood sugar could go low.   check your blood sugar twice a day.  vary the time of day when you check, between before the 3 meals, and at bedtime.  also check if you have symptoms of your blood sugar being too high or too low.  please keep a record of the readings and bring it to your next appointment here.  please call us sooner if your blood sugar goes below 70, or if it stays over 200.   Please see a weight-loss surgery specialist.  you will receive a phone call, about a day and time for an appointment.

## 2014-08-09 ENCOUNTER — Other Ambulatory Visit: Payer: Self-pay | Admitting: Endocrinology

## 2014-08-13 DIAGNOSIS — N183 Chronic kidney disease, stage 3 (moderate): Secondary | ICD-10-CM | POA: Diagnosis not present

## 2014-08-13 DIAGNOSIS — I10 Essential (primary) hypertension: Secondary | ICD-10-CM | POA: Diagnosis not present

## 2014-08-13 DIAGNOSIS — E6609 Other obesity due to excess calories: Secondary | ICD-10-CM | POA: Diagnosis not present

## 2014-08-13 DIAGNOSIS — M1000 Idiopathic gout, unspecified site: Secondary | ICD-10-CM | POA: Diagnosis not present

## 2014-08-13 DIAGNOSIS — M25562 Pain in left knee: Secondary | ICD-10-CM | POA: Diagnosis not present

## 2014-08-13 DIAGNOSIS — E1165 Type 2 diabetes mellitus with hyperglycemia: Secondary | ICD-10-CM | POA: Diagnosis not present

## 2014-08-13 DIAGNOSIS — E782 Mixed hyperlipidemia: Secondary | ICD-10-CM | POA: Diagnosis not present

## 2014-08-14 ENCOUNTER — Other Ambulatory Visit: Payer: Self-pay | Admitting: Endocrinology

## 2014-11-05 ENCOUNTER — Ambulatory Visit (INDEPENDENT_AMBULATORY_CARE_PROVIDER_SITE_OTHER): Payer: Managed Care, Other (non HMO) | Admitting: Endocrinology

## 2014-11-05 ENCOUNTER — Encounter: Payer: Self-pay | Admitting: Endocrinology

## 2014-11-05 VITALS — BP 138/84 | HR 72 | Temp 98.0°F | Ht 70.0 in | Wt 280.0 lb

## 2014-11-05 DIAGNOSIS — E1151 Type 2 diabetes mellitus with diabetic peripheral angiopathy without gangrene: Secondary | ICD-10-CM | POA: Diagnosis not present

## 2014-11-05 DIAGNOSIS — I739 Peripheral vascular disease, unspecified: Secondary | ICD-10-CM

## 2014-11-05 DIAGNOSIS — E1159 Type 2 diabetes mellitus with other circulatory complications: Secondary | ICD-10-CM | POA: Diagnosis not present

## 2014-11-05 LAB — HEMOGLOBIN A1C: HEMOGLOBIN A1C: 7.8 % — AB (ref 4.6–6.5)

## 2014-11-05 NOTE — Patient Instructions (Addendum)
blood tests are requested for you today.  We'll let you know about the results.    Please come back for a follow-up appointment in 3 months.   On this type of insulin schedule, you should eat meals on a regular schedule.  If a meal is missed or significantly delayed, your blood sugar could go low.   check your blood sugar twice a day.  vary the time of day when you check, between before the 3 meals, and at bedtime.  also check if you have symptoms of your blood sugar being too high or too low.  please keep a record of the readings and bring it to your next appointment here.  please call us sooner if your blood sugar goes below 70, or if it stays over 200.

## 2014-11-05 NOTE — Progress Notes (Signed)
Subjective:    Patient ID: Gabriel Alvarez, male    DOB: May 04, 1951, 64 y.o.   MRN: [id number]  HPI Pt returns for f/u of diabetes mellitus: DM type: insulin-requiring type 2 Dx'ed: 7829 Complications: sensory neuropathy, nephropathy, CAD, and PAD.  Therapy: BID insulin DKA: never Severe hypoglycemia: never Pancreatitis: never Other: in 2010, he changed to a simpler insulin schedule, after poor results on multiple daily injections.  Interval history: He takes lantus 80 units qd, and humalog 20 units with the evening meal.  he brings a record of his cbg's which i have reviewed today.  It varies from 76-260.  There is no trend throughout the day, but he does not check at hs.   Past Medical History  Diagnosis Date  . ED (erectile dysfunction)   . DM neuropathy, type II diabetes mellitus   . DIABETES MELLITUS, TYPE II 12/30/2006  . HYPERCHOLESTEROLEMIA 07/30/2008  . GOUT 07/30/2008  . HYPERTENSION 12/30/2006  . CORONARY ARTERY DISEASE 12/30/2006  . CEREBROVASCULAR ACCIDENT, ACUTE 09/05/2008    Past Surgical History  Procedure Laterality Date  . Hand sursgery  02/2102    right hand, 8 stitches    History   Social History  . Marital Status: Married    Spouse Name: N/A  . Number of Children: N/A  . Years of Education: N/A   Occupational History  .      disabled   Social History Main Topics  . Smoking status: Current Every Day Smoker -- 1.00 packs/day for 30 years    Types: Cigarettes, Cigars  . Smokeless tobacco: Not on file     Comment: has stopped smoking cigarettes since last hospital stay  . Alcohol Use: No  . Drug Use: No  . Sexual Activity: Not on file   Other Topics Concern  . Not on file   Social History Narrative    Current Outpatient Prescriptions on File Prior to Visit  Medication Sig Dispense Refill  . aspirin 325 MG tablet Take 325 mg by mouth 2 (two) times daily.      . BD INSULIN SYRINGE ULTRAFINE 31G X 5/16" 1 ML MISC USE 2 SYRINGES PER DAY 180 each 2    . BD ULTRA-FINE PEN NEEDLES 29G X 12.7MM MISC USE 1 PER DAY 1 each 10  . clopidogrel (PLAVIX) 75 MG tablet Take 75 mg by mouth daily.      Marland Kitchen COLCRYS 0.6 MG tablet     . enalapril (VASOTEC) 20 MG tablet Take 20 mg by mouth daily.      Marland Kitchen HUMALOG KWIKPEN 100 UNIT/ML KiwkPen INJECT 20 UNITS  UNDER THE SKIN DAILY WITH SUPPER (Patient taking differently: INJECT 30 UNITS  UNDER THE SKIN DAILY WITH SUPPER) 5 pen 3  . hydrochlorothiazide 25 MG tablet 1 tablet by mouth once daily    . insulin glargine (LANTUS) 100 UNIT/ML injection Inject 90 Units into the skin daily.     . metoprolol (TOPROL-XL) 50 MG 24 hr tablet Take 50 mg by mouth daily.      Glory Rosebush VERIO test strip USE TO CHECK BLOOD SUGAR TWICE A DAY 200 each 2  . simvastatin (ZOCOR) 20 MG tablet Take 20 mg by mouth at bedtime.      . traZODone (DESYREL) 50 MG tablet Take 50 mg by mouth at bedtime.     No current facility-administered medications on file prior to visit.    No Known Allergies  Family History  Problem Relation Age of Onset  .  Diabetes Father     BP 138/84 mmHg  Pulse 72  Temp(Src) 98 F (36.7 C) (Oral)  Ht 5' 10" (1.778 m)  Wt 280 lb (127.007 kg)  BMI 40.18 kg/m2  SpO2 95%  Review of Systems He has gained weight.  He denies hypoglycemia.     Objective:   Physical Exam VITAL SIGNS:  See vs page GENERAL: no distress Pulses: dorsalis pedis intact bilat.   MSK: no deformity of the feet CV: 1+ bilat leg edema.   Skin:  no ulcer on the feet.  normal color and temp on the feet. Neuro: sensation is intact to touch on the feet.    Lab Results  Component Value Date   HGBA1C 7.8* 11/05/2014       Assessment & Plan:  Morbid obesity, worse: he declines surgery, due to copay. DM: this is the best control this pt should aim for, given this regimen, which does match insulin to her changing needs throughout the day  Patient is advised the following: Patient Instructions  blood tests are requested for you  today.  We'll let you know about the results.    Please come back for a follow-up appointment in 3 months.   On this type of insulin schedule, you should eat meals on a regular schedule.  If a meal is missed or significantly delayed, your blood sugar could go low.   check your blood sugar twice a day.  vary the time of day when you check, between before the 3 meals, and at bedtime.  also check if you have symptoms of your blood sugar being too high or too low.  please keep a record of the readings and bring it to your next appointment here.  please call us sooner if your blood sugar goes below 70, or if it stays over 200.     Addendum: Please continue the same insulins.

## 2015-01-19 ENCOUNTER — Other Ambulatory Visit: Payer: Self-pay | Admitting: Endocrinology

## 2015-02-05 ENCOUNTER — Ambulatory Visit: Payer: Medicare Other | Admitting: Dietician

## 2015-02-05 ENCOUNTER — Ambulatory Visit: Payer: Medicare Other | Admitting: Endocrinology

## 2015-02-15 ENCOUNTER — Ambulatory Visit (INDEPENDENT_AMBULATORY_CARE_PROVIDER_SITE_OTHER): Payer: Managed Care, Other (non HMO) | Admitting: Endocrinology

## 2015-02-15 ENCOUNTER — Ambulatory Visit: Payer: Medicare Other | Admitting: Dietician

## 2015-02-15 ENCOUNTER — Encounter: Payer: Self-pay | Admitting: Endocrinology

## 2015-02-15 VITALS — BP 132/88 | HR 77 | Temp 97.5°F | Ht 70.0 in | Wt 278.0 lb

## 2015-02-15 DIAGNOSIS — I739 Peripheral vascular disease, unspecified: Secondary | ICD-10-CM

## 2015-02-15 DIAGNOSIS — E1159 Type 2 diabetes mellitus with other circulatory complications: Secondary | ICD-10-CM

## 2015-02-15 DIAGNOSIS — E1151 Type 2 diabetes mellitus with diabetic peripheral angiopathy without gangrene: Secondary | ICD-10-CM

## 2015-02-15 LAB — POCT GLYCOSYLATED HEMOGLOBIN (HGB A1C): HEMOGLOBIN A1C: 10.6

## 2015-02-15 NOTE — Patient Instructions (Addendum)
Please come back for a follow-up appointment in 2 months.   Please increase the lantus to 100 units each morning, and: Please continue the same humalog.  On this type of insulin schedule, you should eat meals on a regular schedule.  If a meal is missed or significantly delayed, your blood sugar could go low.   check your blood sugar twice a day.  vary the time of day when you check, between before the 3 meals, and at bedtime.  also check if you have symptoms of your blood sugar being too high or too low.  please keep a record of the readings and bring it to your next appointment here.  please call us sooner if your blood sugar goes below 70, or if it stays over 200.

## 2015-02-15 NOTE — Progress Notes (Signed)
Subjective:    Patient ID: Gabriel Alvarez, male    DOB: December 04, 1950, 64 y.o.   MRN: [id number]  HPI Pt returns for f/u of diabetes mellitus: DM type: insulin-requiring type 2 Dx'ed: 3748 Complications: polyneuropathy, nephropathy, CAD, and PAD.  Therapy: BID insulin DKA: never Severe hypoglycemia: never Pancreatitis: never Other: in 2010, he changed to a simpler insulin schedule, after poor results on multiple daily injections.  Interval history: He takes lantus 80 units qd, and humalog 20 units with the evening meal.  Pt says he seldom misses the insulin.  no cbg record, but states cbg's vary from 120-200's.  I asked wife, who is with pt today, who says pt's diet is poor.   Past Medical History  Diagnosis Date  . ED (erectile dysfunction)   . DM neuropathy, type II diabetes mellitus   . DIABETES MELLITUS, TYPE II 12/30/2006  . HYPERCHOLESTEROLEMIA 07/30/2008  . GOUT 07/30/2008  . HYPERTENSION 12/30/2006  . CORONARY ARTERY DISEASE 12/30/2006  . CEREBROVASCULAR ACCIDENT, ACUTE 09/05/2008    Past Surgical History  Procedure Laterality Date  . Hand sursgery  02/2102    right hand, 8 stitches    Social History   Social History  . Marital Status: Married    Spouse Name: N/A  . Number of Children: N/A  . Years of Education: N/A   Occupational History  .      disabled   Social History Main Topics  . Smoking status: Current Every Day Smoker -- 1.00 packs/day for 30 years    Types: Cigarettes, Cigars  . Smokeless tobacco: Not on file     Comment: has stopped smoking cigarettes since last hospital stay  . Alcohol Use: No  . Drug Use: No  . Sexual Activity: Not on file   Other Topics Concern  . Not on file   Social History Narrative    Current Outpatient Prescriptions on File Prior to Visit  Medication Sig Dispense Refill  . aspirin 325 MG tablet Take 325 mg by mouth 2 (two) times daily.      . B-D ULTRAFINE III SHORT PEN 31G X 8 MM MISC USE 1 PEN NEEDLE DAILY 90 each 0  .  BD INSULIN SYRINGE ULTRAFINE 31G X 5/16" 1 ML MISC USE 2 SYRINGES PER DAY 180 each 2  . BD ULTRA-FINE PEN NEEDLES 29G X 12.7MM MISC USE 1 PER DAY 1 each 10  . clopidogrel (PLAVIX) 75 MG tablet Take 75 mg by mouth daily.      Marland Kitchen COLCRYS 0.6 MG tablet     . enalapril (VASOTEC) 20 MG tablet Take 20 mg by mouth daily.      Marland Kitchen HUMALOG KWIKPEN 100 UNIT/ML KiwkPen INJECT 20 UNITS  UNDER THE SKIN DAILY WITH SUPPER 5 pen 3  . hydrochlorothiazide 25 MG tablet 1 tablet by mouth once daily    . insulin glargine (LANTUS) 100 UNIT/ML injection Inject 100 Units into the skin daily.     . metoprolol (TOPROL-XL) 50 MG 24 hr tablet Take 50 mg by mouth daily.      Glory Rosebush VERIO test strip USE TO CHECK BLOOD SUGAR TWICE A DAY 200 each 2  . simvastatin (ZOCOR) 20 MG tablet Take 20 mg by mouth at bedtime.      . traZODone (DESYREL) 50 MG tablet Take 50 mg by mouth at bedtime.     No current facility-administered medications on file prior to visit.    No Known Allergies  Family History  Problem Relation Age of Onset  . Diabetes Father     BP 132/88 mmHg  Pulse 77  Temp(Src) 97.5 F (36.4 C) (Oral)  Ht _0  (1.778 m)  Wt 278 lb (126.1 kg)  BMI 39.89 kg/m2  SpO2 90%  Review of Systems He denies hypoglycemia.      Objective:   Physical Exam VITAL SIGNS:  See vs page GENERAL: no distress Pulses: dorsalis pedis intact bilat.   MSK: no deformity of the feet CV: trace bilat leg edema Skin:  no ulcer on the feet.  normal color and temp on the feet. Neuro: sensation is intact to touch on the feet, but decreased from normal   A1c=10.6%    Assessment & Plan:  DM: worse for uncertain reason.  Patient is advised the following: Patient Instructions  Please come back for a follow-up appointment in 2 months.   Please increase the lantus to 100 units each morning, and: Please continue the same humalog.  On this type of insulin schedule, you should eat meals on a regular schedule.  If a meal is  missed or significantly delayed, your blood sugar could go low.   check your blood sugar twice a day.  vary the time of day when you check, between before the 3 meals, and at bedtime.  also check if you have symptoms of your blood sugar being too high or too low.  please keep a record of the readings and bring it to your next appointment here.  please call us sooner if your blood sugar goes below 70, or if it stays over 200.

## 2015-04-17 ENCOUNTER — Encounter: Payer: Self-pay | Admitting: Endocrinology

## 2015-04-17 ENCOUNTER — Ambulatory Visit (INDEPENDENT_AMBULATORY_CARE_PROVIDER_SITE_OTHER): Payer: Managed Care, Other (non HMO) | Admitting: Endocrinology

## 2015-04-17 VITALS — BP 147/94 | HR 63 | Temp 98.1°F | Ht 70.0 in | Wt 278.0 lb

## 2015-04-17 DIAGNOSIS — E1151 Type 2 diabetes mellitus with diabetic peripheral angiopathy without gangrene: Secondary | ICD-10-CM | POA: Diagnosis not present

## 2015-04-17 MED ORDER — INSULIN LISPRO 100 UNIT/ML (KWIKPEN): PEN_INJECTOR | SUBCUTANEOUS | Status: DC

## 2015-04-17 NOTE — Progress Notes (Signed)
Subjective:    Patient ID: Gabriel Alvarez, male    DOB: [birth year], 64 y.o.   MRN: [id number]  HPI Pt returns for f/u of diabetes mellitus: DM type: insulin-requiring type 2 Dx'ed: 6803 Complications: polyneuropathy, nephropathy, CAD, and PAD.  Therapy: insulin since 2001 DKA: never Severe hypoglycemia: never. Pancreatitis: never.   Other: in 2010, he changed to a simpler insulin schedule, after poor results on multiple daily injections.  Interval history: He takes lantus 80 units qd, and humalog 20 units with the evening meal.  Pt says he seldom misses the insulin.  no cbg record, but states cbg's vary from 110-200's.  He does not check at hs.    Past Medical History  Diagnosis Date  . ED (erectile dysfunction)   . DM neuropathy, type II diabetes mellitus ([place])   . DIABETES MELLITUS, TYPE II 12/30/2006  . HYPERCHOLESTEROLEMIA 07/30/2008  . GOUT 07/30/2008  . HYPERTENSION 12/30/2006  . CORONARY ARTERY DISEASE 12/30/2006  . CEREBROVASCULAR ACCIDENT, ACUTE 09/05/2008    Past Surgical History  Procedure Laterality Date  . Hand sursgery  02/2102    right hand, 8 stitches    Social History   Social History  . Marital Status: Married    Spouse Name: N/A  . Number of Children: N/A  . Years of Education: N/A   Occupational History  .      disabled   Social History Main Topics  . Smoking status: Current Every Day Smoker -- 1.00 packs/day for 30 years    Types: Cigarettes, Cigars  . Smokeless tobacco: Not on file     Comment: has stopped smoking cigarettes since last hospital stay  . Alcohol Use: No  . Drug Use: No  . Sexual Activity: Not on file   Other Topics Concern  . Not on file   Social History Narrative    Current Outpatient Prescriptions on File Prior to Visit  Medication Sig Dispense Refill  . aspirin 325 MG tablet Take 325 mg by mouth 2 (two) times daily.      . B-D ULTRAFINE III SHORT PEN 31G X 8 MM MISC USE 1 PEN NEEDLE DAILY 90 each 0  . BD INSULIN SYRINGE  ULTRAFINE 31G X 5/16" 1 ML MISC USE 2 SYRINGES PER DAY 180 each 2  . BD ULTRA-FINE PEN NEEDLES 29G X 12.7MM MISC USE 1 PER DAY 1 each 10  . clopidogrel (PLAVIX) 75 MG tablet Take 75 mg by mouth daily.      Marland Kitchen COLCRYS 0.6 MG tablet     . enalapril (VASOTEC) 20 MG tablet Take 20 mg by mouth daily.      . hydrochlorothiazide 25 MG tablet 1 tablet by mouth once daily    . insulin glargine (LANTUS) 100 UNIT/ML injection Inject 100 Units into the skin daily.     . metoprolol (TOPROL-XL) 50 MG 24 hr tablet Take 50 mg by mouth daily.      Glory Rosebush VERIO test strip USE TO CHECK BLOOD SUGAR TWICE A DAY 200 each 2  . simvastatin (ZOCOR) 20 MG tablet Take 20 mg by mouth at bedtime.      . traZODone (DESYREL) 50 MG tablet Take 50 mg by mouth at bedtime.     No current facility-administered medications on file prior to visit.    No Known Allergies  Family History  Problem Relation Age of Onset  . Diabetes Father     BP 147/94 mmHg  Pulse 63  Temp(Src) 98.1 F (  36.7 C) (Oral)  Ht _0  (1.778 m)  Wt 278 lb (126.1 kg)  BMI 39.89 kg/m2  SpO2 92%  Review of Systems He denies hypoglycemia    Objective:   Physical Exam VITAL SIGNS:  See vs page GENERAL: no distress Pulses: dorsalis pedis intact bilat.   MSK: no deformity of the feet CV: 1+ bilat leg edema Skin:  no ulcer on the feet.  normal color and temp on the feet. Neuro: sensation is intact to touch on the feet, but decreased from normal.    outside test results are reviewed: A1c=9.0%    Assessment & Plan:  DM: he needs increased rx.   Patient is advised the following: Patient Instructions  Please increase the humalog to 30 units with the evening meal. Please continue the same lantus. Please come back for a follow-up appointment in 3 months On this type of insulin schedule, you should eat meals on a regular schedule.  If a meal is missed or significantly delayed, your blood sugar could go low.   check your blood sugar  twice a day.  vary the time of day when you check, between before the 3 meals, and at bedtime.  also check if you have symptoms of your blood sugar being too high or too low.  please keep a record of the readings and bring it to your next appointment here.  please call us sooner if your blood sugar goes below 70, or if it stays over 200.

## 2015-04-17 NOTE — Patient Instructions (Signed)
Please increase the humalog to 30 units with the evening meal. Please continue the same lantus. Please come back for a follow-up appointment in 3 months On this type of insulin schedule, you should eat meals on a regular schedule.  If a meal is missed or significantly delayed, your blood sugar could go low.   check your blood sugar twice a day.  vary the time of day when you check, between before the 3 meals, and at bedtime.  also check if you have symptoms of your blood sugar being too high or too low.  please keep a record of the readings and bring it to your next appointment here.  please call us sooner if your blood sugar goes below 70, or if it stays over 200.

## 2015-04-18 ENCOUNTER — Other Ambulatory Visit: Payer: Self-pay

## 2015-04-18 MED ORDER — INSULIN LISPRO 100 UNIT/ML (KWIKPEN): PEN_INJECTOR | SUBCUTANEOUS | Status: DC

## 2015-04-23 ENCOUNTER — Other Ambulatory Visit: Payer: Self-pay | Admitting: Endocrinology

## 2015-05-04 ENCOUNTER — Other Ambulatory Visit: Payer: Self-pay | Admitting: Endocrinology

## 2015-07-10 ENCOUNTER — Other Ambulatory Visit: Payer: Self-pay | Admitting: Endocrinology

## 2015-07-18 ENCOUNTER — Ambulatory Visit (INDEPENDENT_AMBULATORY_CARE_PROVIDER_SITE_OTHER): Payer: Managed Care, Other (non HMO) | Admitting: Endocrinology

## 2015-07-18 ENCOUNTER — Encounter: Payer: Self-pay | Admitting: Endocrinology

## 2015-07-18 VITALS — BP 142/94 | HR 66 | Temp 97.8°F | Ht 70.0 in | Wt 285.0 lb

## 2015-07-18 DIAGNOSIS — E1151 Type 2 diabetes mellitus with diabetic peripheral angiopathy without gangrene: Secondary | ICD-10-CM | POA: Diagnosis not present

## 2015-07-18 LAB — POCT GLYCOSYLATED HEMOGLOBIN (HGB A1C): Hemoglobin A1C: 8.4

## 2015-07-18 MED ORDER — INSULIN LISPRO 100 UNIT/ML (KWIKPEN): 40.0000 [IU] | PEN_INJECTOR | Freq: Every day | SUBCUTANEOUS | Status: DC

## 2015-07-18 NOTE — Patient Instructions (Addendum)
Please increase the humalog to 40 units with the evening meal.  Please continue the same lantus.   Please come back for a follow-up appointment in 2 months.   On this type of insulin schedule, you should eat meals on a regular schedule.  If a meal is missed or significantly delayed, your blood sugar could go low.   check your blood sugar twice a day.  vary the time of day when you check, between before the 3 meals, and at bedtime.  also check if you have symptoms of your blood sugar being too high or too low.  please keep a record of the readings and bring it to your next appointment here.  please call us sooner if your blood sugar goes below 70, or if it stays over 200.

## 2015-07-18 NOTE — Progress Notes (Signed)
Subjective:    Patient ID: Gabriel Alvarez, male    DOB: 1950-08-24, 65 y.o.   MRN: [id number]  HPI Pt returns for f/u of diabetes mellitus: DM type: insulin-requiring type 2 Dx'ed: 8590 Complications: polyneuropathy, nephropathy, CAD, and PAD.  Therapy: insulin since 2001 DKA: never Severe hypoglycemia: never.  Pancreatitis: never.   Other: in 2010, he changed to a simpler insulin schedule, after poor results on multiple daily injections.  Interval history: no cbg record, but states cbg's vary from 104-330.  It is in general higher as the day goes on.  pt states he feels well in general, except for fatigue.  He says he never misses the insulin.   Past Medical History  Diagnosis Date  . ED (erectile dysfunction)   . DM neuropathy, type II diabetes mellitus ([place])   . DIABETES MELLITUS, TYPE II 12/30/2006  . HYPERCHOLESTEROLEMIA 07/30/2008  . GOUT 07/30/2008  . HYPERTENSION 12/30/2006  . CORONARY ARTERY DISEASE 12/30/2006  . CEREBROVASCULAR ACCIDENT, ACUTE 09/05/2008    Past Surgical History  Procedure Laterality Date  . Hand sursgery  02/2102    right hand, 8 stitches    Social History   Social History  . Marital Status: Married    Spouse Name: N/A  . Number of Children: N/A  . Years of Education: N/A   Occupational History  .      disabled   Social History Main Topics  . Smoking status: Current Every Day Smoker -- 1.00 packs/day for 30 years    Types: Cigarettes, Cigars  . Smokeless tobacco: Not on file     Comment: has stopped smoking cigarettes since last hospital stay  . Alcohol Use: No  . Drug Use: No  . Sexual Activity: Not on file   Other Topics Concern  . Not on file   Social History Narrative    Current Outpatient Prescriptions on File Prior to Visit  Medication Sig Dispense Refill  . aspirin 325 MG tablet Take 325 mg by mouth 2 (two) times daily.      . B-D ULTRAFINE III SHORT PEN 31G X 8 MM MISC USE 1 PEN NEEDLE DAILY 90 each 2  . BD INSULIN SYRINGE  ULTRAFINE 31G X 5/16" 1 ML MISC USE 2 SYRINGES PER DAY 180 each 2  . BD ULTRA-FINE PEN NEEDLES 29G X 12.7MM MISC USE 1 PER DAY 1 each 10  . clopidogrel (PLAVIX) 75 MG tablet Take 75 mg by mouth daily.      Marland Kitchen COLCRYS 0.6 MG tablet     . enalapril (VASOTEC) 20 MG tablet Take 20 mg by mouth daily.      . hydrochlorothiazide 25 MG tablet 1 tablet by mouth once daily    . insulin glargine (LANTUS) 100 UNIT/ML injection Inject 100 Units into the skin daily.     Marland Kitchen LANTUS 100 UNIT/ML injection INJECT 100 UNITS UNDER THE SKIN EVERY MORNING 100 mL 9  . metoprolol (TOPROL-XL) 50 MG 24 hr tablet Take 50 mg by mouth daily.      Glory Rosebush VERIO test strip USE TO CHECK BLOOD SUGAR TWICE A DAY 200 each 1  . simvastatin (ZOCOR) 20 MG tablet Take 20 mg by mouth at bedtime.      . traZODone (DESYREL) 50 MG tablet Take 50 mg by mouth at bedtime. Reported on 07/18/2015     No current facility-administered medications on file prior to visit.    No Known Allergies  Family History  Problem Relation Age  of Onset  . Diabetes Father     BP 142/94 mmHg  Pulse 66  Temp(Src) 97.8 F (36.6 C) (Oral)  Ht 5' 10" (1.778 m)  Wt 285 lb (129.275 kg)  BMI 40.89 kg/m2  SpO2 91%  Review of Systems He denies hypoglycemia.      Objective:   Physical Exam VITAL SIGNS:  See vs page GENERAL: no distress Pulses: dorsalis pedis intact bilat.  MSK: no deformity of the feet. CV: 1+ bilat leg edema.  Skin: no ulcer on the feet. normal temp on the feet.  There is patchy hyperpigmentation.  Neuro: sensation is intact to touch on the feet, but decreased from normal.   A1c=8.4%     Assessment & Plan:  DM: he needs increased rx  Patient is advised the following: Patient Instructions  Please increase the humalog to 40 units with the evening meal.  Please continue the same lantus.   Please come back for a follow-up appointment in 2 months.   On this type of insulin schedule, you should eat meals on a regular  schedule.  If a meal is missed or significantly delayed, your blood sugar could go low.   check your blood sugar twice a day.  vary the time of day when you check, between before the 3 meals, and at bedtime.  also check if you have symptoms of your blood sugar being too high or too low.  please keep a record of the readings and bring it to your next appointment here.  please call us sooner if your blood sugar goes below 70, or if it stays over 200.

## 2015-08-09 DIAGNOSIS — E6609 Other obesity due to excess calories: Secondary | ICD-10-CM | POA: Diagnosis not present

## 2015-08-09 DIAGNOSIS — E1165 Type 2 diabetes mellitus with hyperglycemia: Secondary | ICD-10-CM | POA: Diagnosis not present

## 2015-08-09 DIAGNOSIS — M1000 Idiopathic gout, unspecified site: Secondary | ICD-10-CM | POA: Diagnosis not present

## 2015-08-09 DIAGNOSIS — N183 Chronic kidney disease, stage 3 (moderate): Secondary | ICD-10-CM | POA: Diagnosis not present

## 2015-08-09 DIAGNOSIS — E782 Mixed hyperlipidemia: Secondary | ICD-10-CM | POA: Diagnosis not present

## 2015-08-09 DIAGNOSIS — I10 Essential (primary) hypertension: Secondary | ICD-10-CM | POA: Diagnosis not present

## 2015-09-15 DIAGNOSIS — E119 Type 2 diabetes mellitus without complications: Secondary | ICD-10-CM | POA: Insufficient documentation

## 2015-09-15 NOTE — Progress Notes (Signed)
Subjective:    Patient ID: OZZY BOHLKEN, male    DOB: [birth year], 65 y.o.   MRN: [id number]  HPI Pt returns for f/u of diabetes mellitus: DM type: insulin-requiring type 2 Dx'ed: 1224 Complications: polyneuropathy, renal insuff, CAD, and PAD.   Therapy: insulin since 2001.  DKA: never.  Severe hypoglycemia: never.   Pancreatitis: never.   Other: in 2010, he changed to a simpler insulin schedule, after poor results on multiple daily injections.   Interval history: no cbg record, but states cbg's vary from 87-180.  It is in general highest at hs.  pt states he feels well in general, except for chronic doe.  He says he never misses the insulin.   Past Medical History  Diagnosis Date  . ED (erectile dysfunction)   . DM neuropathy, type II diabetes mellitus ([place])   . DIABETES MELLITUS, TYPE II 12/30/2006  . HYPERCHOLESTEROLEMIA 07/30/2008  . GOUT 07/30/2008  . HYPERTENSION 12/30/2006  . CORONARY ARTERY DISEASE 12/30/2006  . CEREBROVASCULAR ACCIDENT, ACUTE 09/05/2008    Past Surgical History  Procedure Laterality Date  . Hand sursgery  02/2102    right hand, 8 stitches    Social History   Social History  . Marital Status: Married    Spouse Name: N/A  . Number of Children: N/A  . Years of Education: N/A   Occupational History  .      disabled   Social History Main Topics  . Smoking status: Current Every Day Smoker -- 1.00 packs/day for 30 years    Types: Cigarettes, Cigars  . Smokeless tobacco: Not on file     Comment: has stopped smoking cigarettes since last hospital stay  . Alcohol Use: No  . Drug Use: No  . Sexual Activity: Not on file   Other Topics Concern  . Not on file   Social History Narrative    Current Outpatient Prescriptions on File Prior to Visit  Medication Sig Dispense Refill  . aspirin 325 MG tablet Take 325 mg by mouth 2 (two) times daily.      . B-D ULTRAFINE III SHORT PEN 31G X 8 MM MISC USE 1 PEN NEEDLE DAILY 90 each 2  . BD INSULIN SYRINGE  ULTRAFINE 31G X 5/16" 1 ML MISC USE 2 SYRINGES PER DAY 180 each 2  . BD ULTRA-FINE PEN NEEDLES 29G X 12.7MM MISC USE 1 PER DAY 1 each 10  . clopidogrel (PLAVIX) 75 MG tablet Take 75 mg by mouth daily.      Marland Kitchen COLCRYS 0.6 MG tablet     . enalapril (VASOTEC) 20 MG tablet Take 20 mg by mouth daily.      . hydrochlorothiazide 25 MG tablet 1 tablet by mouth once daily    . insulin glargine (LANTUS) 100 UNIT/ML injection Inject 100 Units into the skin daily.     . metoprolol (TOPROL-XL) 50 MG 24 hr tablet Take 50 mg by mouth daily.      Glory Rosebush VERIO test strip USE TO CHECK BLOOD SUGAR TWICE A DAY 200 each 1  . simvastatin (ZOCOR) 20 MG tablet Take 20 mg by mouth at bedtime.      . traZODone (DESYREL) 50 MG tablet Take 50 mg by mouth at bedtime. Reported on 07/18/2015     No current facility-administered medications on file prior to visit.    No Known Allergies  Family History  Problem Relation Age of Onset  . Diabetes Father     BP 134/94 mmHg  Pulse 69  Temp(Src) 97.9 F (36.6 C) (Oral)  Ht _0  (1.778 m)  Wt 285 lb (129.275 kg)  BMI 40.89 kg/m2  SpO2 94%  Review of Systems He denies hypoglycemia.  He has chronic weight gain.      Objective:   Physical Exam VITAL SIGNS:  See vs page GENERAL: no distress Pulses: dorsalis pedis intact bilat.  MSK: no deformity of the feet. CV: 1+ bilat leg edema.  Skin: no ulcer on the feet. normal temp on the feet.  There is patchy hyperpigmentation.  Neuro: sensation is intact to touch on the feet, but decreased from normal.    A1c=9.0%    Assessment & Plan:  DM: glycemic control is worse. Weight gain: pt is advised to re-lose.    Patient is advised the following: Patient Instructions  Please increase the humalog to 50 units with the evening meal.  Please continue the same lantus.   Please come back for a follow-up appointment in 2 months.   On this type of insulin schedule, you should eat meals on a regular schedule.  If a  meal is missed or significantly delayed, your blood sugar could go low.   check your blood sugar twice a day.  vary the time of day when you check, between before the 3 meals, and at bedtime.  also check if you have symptoms of your blood sugar being too high or too low.  please keep a record of the readings and bring it to your next appointment here.  please call us sooner if your blood sugar goes below 70, or if it stays over 200.

## 2015-09-16 ENCOUNTER — Ambulatory Visit (INDEPENDENT_AMBULATORY_CARE_PROVIDER_SITE_OTHER): Payer: Managed Care, Other (non HMO) | Admitting: Endocrinology

## 2015-09-16 ENCOUNTER — Encounter: Payer: Self-pay | Admitting: Endocrinology

## 2015-09-16 VITALS — BP 134/94 | HR 69 | Temp 97.9°F | Ht 70.0 in | Wt 285.0 lb

## 2015-09-16 DIAGNOSIS — N183 Chronic kidney disease, stage 3 unspecified: Secondary | ICD-10-CM

## 2015-09-16 DIAGNOSIS — E1122 Type 2 diabetes mellitus with diabetic chronic kidney disease: Secondary | ICD-10-CM | POA: Diagnosis not present

## 2015-09-16 DIAGNOSIS — Z794 Long term (current) use of insulin: Secondary | ICD-10-CM | POA: Diagnosis not present

## 2015-09-16 LAB — POCT GLYCOSYLATED HEMOGLOBIN (HGB A1C): HEMOGLOBIN A1C: 9

## 2015-09-16 MED ORDER — INSULIN LISPRO 100 UNIT/ML (KWIKPEN): 50.0000 [IU] | PEN_INJECTOR | Freq: Every day | SUBCUTANEOUS | Status: DC

## 2015-09-16 NOTE — Patient Instructions (Addendum)
Please increase the humalog to 50 units with the evening meal.  Please continue the same lantus.   Please come back for a follow-up appointment in 2 months.   On this type of insulin schedule, you should eat meals on a regular schedule.  If a meal is missed or significantly delayed, your blood sugar could go low.   check your blood sugar twice a day.  vary the time of day when you check, between before the 3 meals, and at bedtime.  also check if you have symptoms of your blood sugar being too high or too low.  please keep a record of the readings and bring it to your next appointment here.  please call us sooner if your blood sugar goes below 70, or if it stays over 200.

## 2015-10-31 ENCOUNTER — Other Ambulatory Visit: Payer: Self-pay | Admitting: Endocrinology

## 2015-11-15 ENCOUNTER — Encounter: Payer: Self-pay | Admitting: Endocrinology

## 2015-11-15 ENCOUNTER — Ambulatory Visit (INDEPENDENT_AMBULATORY_CARE_PROVIDER_SITE_OTHER): Payer: Managed Care, Other (non HMO) | Admitting: Endocrinology

## 2015-11-15 DIAGNOSIS — E1151 Type 2 diabetes mellitus with diabetic peripheral angiopathy without gangrene: Secondary | ICD-10-CM

## 2015-11-15 LAB — POCT GLYCOSYLATED HEMOGLOBIN (HGB A1C): HEMOGLOBIN A1C: 8.2

## 2015-11-15 NOTE — Progress Notes (Signed)
Subjective:    Patient ID: Gabriel Alvarez, male    DOB: 1951-06-14, 65 y.o.   MRN: [id number]  HPI Pt returns for f/u of diabetes mellitus: DM type: insulin-requiring type 2 Dx'ed: 0240 Complications: polyneuropathy, renal insuff, CAD, and PAD.   Therapy: insulin since 2001.  DKA: never.  Severe hypoglycemia: never.   Pancreatitis: never.   Other: in 2010, he changed to a simpler insulin schedule, after poor results on multiple daily injections.  Interval history: no cbg record, but states cbg's vary from 70-224.  It is in general highest in am (despite not eating at hs), and lowest at lunch.  He often skips breakfast.  pt states he feels well in general, except for chronic doe.  He says he never misses the insulin.  He denies hypoglycemia.   Past Medical History  Diagnosis Date  . ED (erectile dysfunction)   . DM neuropathy, type II diabetes mellitus ([place])   . DIABETES MELLITUS, TYPE II 12/30/2006  . HYPERCHOLESTEROLEMIA 07/30/2008  . GOUT 07/30/2008  . HYPERTENSION 12/30/2006  . CORONARY ARTERY DISEASE 12/30/2006  . CEREBROVASCULAR ACCIDENT, ACUTE 09/05/2008    Past Surgical History  Procedure Laterality Date  . Hand sursgery  02/2102    right hand, 8 stitches    Social History   Social History  . Marital Status: Married    Spouse Name: N/A  . Number of Children: N/A  . Years of Education: N/A   Occupational History  .      disabled   Social History Main Topics  . Smoking status: Current Every Day Smoker -- 1.00 packs/day for 30 years    Types: Cigarettes, Cigars  . Smokeless tobacco: Not on file     Comment: has stopped smoking cigarettes since last hospital stay  . Alcohol Use: No  . Drug Use: No  . Sexual Activity: Not on file   Other Topics Concern  . Not on file   Social History Narrative    Current Outpatient Prescriptions on File Prior to Visit  Medication Sig Dispense Refill  . aspirin 325 MG tablet Take 325 mg by mouth 2 (two) times daily.      . B-D  ULTRAFINE III SHORT PEN 31G X 8 MM MISC USE 1 PEN NEEDLE DAILY 90 each 2  . BD INSULIN SYRINGE ULTRAFINE 31G X 5/16" 1 ML MISC USE 2 SYRINGES PER DAY 180 each 2  . BD ULTRA-FINE PEN NEEDLES 29G X 12.7MM MISC USE 1 PER DAY 1 each 10  . clopidogrel (PLAVIX) 75 MG tablet Take 75 mg by mouth daily.      Marland Kitchen COLCRYS 0.6 MG tablet     . enalapril (VASOTEC) 20 MG tablet Take 20 mg by mouth daily.      . hydrochlorothiazide 25 MG tablet 1 tablet by mouth once daily    . insulin glargine (LANTUS) 100 UNIT/ML injection Inject 110 Units into the skin every morning.     . insulin lispro (HUMALOG KWIKPEN) 100 UNIT/ML KiwkPen Inject 0.5 mLs (50 Units total) into the skin daily with supper. And pen needles 2/day 60 mL 3  . metoprolol (TOPROL-XL) 50 MG 24 hr tablet Take 50 mg by mouth daily.      Glory Rosebush VERIO test strip USE TO CHECK BLOOD SUGAR TWICE A DAY 200 each 0  . simvastatin (ZOCOR) 20 MG tablet Take 20 mg by mouth at bedtime.      . traZODone (DESYREL) 50 MG tablet Take 50 mg  by mouth at bedtime. Reported on 07/18/2015     No current facility-administered medications on file prior to visit.    No Known Allergies  Family History  Problem Relation Age of Onset  . Diabetes Father     BP 130/94 mmHg  Pulse 70  Temp(Src) 97.5 F (36.4 C) (Oral)  Resp 28  Wt 295 lb (133.811 kg)  SpO2 88%  Review of Systems He has gained weight.      Objective:   Physical Exam VITAL SIGNS:  See vs page GENERAL: no distress Pulses: dorsalis pedis intact bilat.  MSK: no deformity of the feet. CV: 1+ bilat leg edema.  Skin: no ulcer on the feet. normal temp on the feet. There is patchy hyperpigmentation.  Neuro: sensation is intact to touch on the feet, but decreased from normal.    A1c=8.2%    Assessment & Plan:  DM: he needs increased rx.   Patient is advised the following: Patient Instructions  Please continue the same humalog, and: increase the lantus to 110 units each morning.     Please come back for a follow-up appointment in 3 months.   Your pneumonia vaccine ("pneumovax") was given on 01/28/12.  On this type of insulin schedule, it is really important to eat meals on a regular schedule.  If a meal is missed or significantly delayed, your blood sugar could go low.   check your blood sugar twice a day.  vary the time of day when you check, between before the 3 meals, and at bedtime.  also check if you have symptoms of your blood sugar being too high or too low.  please keep a record of the readings and bring it to your next appointment here.  please call us sooner if your blood sugar goes below 70, or if it stays over 200.

## 2015-11-15 NOTE — Patient Instructions (Addendum)
Please continue the same humalog, and: increase the lantus to 110 units each morning.   Please come back for a follow-up appointment in 3 months.   Your pneumonia vaccine ("pneumovax") was given on 01/28/12.  On this type of insulin schedule, it is really important to eat meals on a regular schedule.  If a meal is missed or significantly delayed, your blood sugar could go low.   check your blood sugar twice a day.  vary the time of day when you check, between before the 3 meals, and at bedtime.  also check if you have symptoms of your blood sugar being too high or too low.  please keep a record of the readings and bring it to your next appointment here.  please call us sooner if your blood sugar goes below 70, or if it stays over 200.

## 2015-11-21 DIAGNOSIS — H26491 Other secondary cataract, right eye: Secondary | ICD-10-CM | POA: Diagnosis not present

## 2015-11-21 DIAGNOSIS — H26492 Other secondary cataract, left eye: Secondary | ICD-10-CM | POA: Diagnosis not present

## 2015-11-21 DIAGNOSIS — E113293 Type 2 diabetes mellitus with mild nonproliferative diabetic retinopathy without macular edema, bilateral: Secondary | ICD-10-CM | POA: Diagnosis not present

## 2015-11-21 DIAGNOSIS — Z961 Presence of intraocular lens: Secondary | ICD-10-CM | POA: Diagnosis not present

## 2016-01-30 ENCOUNTER — Other Ambulatory Visit: Payer: Self-pay

## 2016-01-30 MED ORDER — GLUCOSE BLOOD VI STRP: ORAL_STRIP | 2 refills | Status: DC

## 2016-02-14 ENCOUNTER — Ambulatory Visit (INDEPENDENT_AMBULATORY_CARE_PROVIDER_SITE_OTHER): Payer: Managed Care, Other (non HMO) | Admitting: Endocrinology

## 2016-02-14 ENCOUNTER — Encounter: Payer: Self-pay | Admitting: Endocrinology

## 2016-02-14 VITALS — BP 146/78 | HR 75 | Ht 70.0 in | Wt 300.0 lb

## 2016-02-14 DIAGNOSIS — E1122 Type 2 diabetes mellitus with diabetic chronic kidney disease: Secondary | ICD-10-CM | POA: Diagnosis not present

## 2016-02-14 DIAGNOSIS — N183 Chronic kidney disease, stage 3 unspecified: Secondary | ICD-10-CM

## 2016-02-14 DIAGNOSIS — Z794 Long term (current) use of insulin: Secondary | ICD-10-CM

## 2016-02-14 LAB — POCT GLYCOSYLATED HEMOGLOBIN (HGB A1C): HEMOGLOBIN A1C: 8.7

## 2016-02-14 NOTE — Patient Instructions (Addendum)
Please continue the same insulins. If you get another steroid shot, take extra novolog for the resulting high blood sugar: 5 units if in the 200's, and 10 units for any over 300.  Please call if we can help you with your diabetes, in this or any situation. The effect of the steroid shot is gone approx 1 week later.   On this type of insulin schedule, it is really important to eat meals on a regular schedule.  If a meal is missed or significantly delayed, your blood sugar could go low.   check your blood sugar twice a day.  vary the time of day when you check, between before the 3 meals, and at bedtime.  also check if you have symptoms of your blood sugar being too high or too low.  please keep a record of the readings and bring it to your next appointment here.  please call us sooner if your blood sugar goes below 70, or if it stays over 200.   Please come back for a follow-up appointment in 3 months.

## 2016-02-14 NOTE — Progress Notes (Signed)
Subjective:    Patient ID: Gabriel Alvarez, male    DOB: [birth year], 65 y.o.   MRN: [id number]  HPI Pt returns for f/u of diabetes mellitus: DM type: insulin-requiring type 2 Dx'ed: 1517 Complications: polyneuropathy, renal insuff, CAD, CVA, and PAD.   Therapy: insulin since 2001.  DKA: never.  Severe hypoglycemia: never.   Pancreatitis: never.   Other: in 2010, he changed to a simpler insulin schedule, after poor results on multiple daily injections.  Interval history: He had a steroid injection for gout last week.  Since then, cbg's have been in the high-200's.  He brings a record of his cbg's which I have reviewed today.  Prior to the injection, it varies from 61-236.  There is no trend throughout the day. Past Medical History:  Diagnosis Date  . CEREBROVASCULAR ACCIDENT, ACUTE 09/05/2008  . CORONARY ARTERY DISEASE 12/30/2006  . DIABETES MELLITUS, TYPE II 12/30/2006  . DM neuropathy, type II diabetes mellitus ([place])   . ED (erectile dysfunction)   . GOUT 07/30/2008  . HYPERCHOLESTEROLEMIA 07/30/2008  . HYPERTENSION 12/30/2006    Past Surgical History:  Procedure Laterality Date  . hand sursgery  02/2102   right hand, 8 stitches    Social History   Social History  . Marital status: Married    Spouse name: N/A  . Number of children: N/A  . Years of education: N/A   Occupational History  .      disabled   Social History Main Topics  . Smoking status: Current Every Day Smoker    Packs/day: 1.00    Years: 30.00    Types: Cigarettes, Cigars  . Smokeless tobacco: Not on file     Comment: has stopped smoking cigarettes since last hospital stay  . Alcohol use No  . Drug use: No  . Sexual activity: Not on file   Other Topics Concern  . Not on file   Social History Narrative  . No narrative on file    Current Outpatient Prescriptions on File Prior to Visit  Medication Sig Dispense Refill  . aspirin 325 MG tablet Take 325 mg by mouth 2 (two) times daily.      . B-D  ULTRAFINE III SHORT PEN 31G X 8 MM MISC USE 1 PEN NEEDLE DAILY 90 each 2  . BD INSULIN SYRINGE ULTRAFINE 31G X 5/16" 1 ML MISC USE 2 SYRINGES PER DAY 180 each 2  . BD ULTRA-FINE PEN NEEDLES 29G X 12.7MM MISC USE 1 PER DAY 1 each 10  . clopidogrel (PLAVIX) 75 MG tablet Take 75 mg by mouth daily.      Marland Kitchen COLCRYS 0.6 MG tablet     . enalapril (VASOTEC) 20 MG tablet Take 20 mg by mouth daily.      Marland Kitchen glucose blood (ONETOUCH VERIO) test strip USE TO CHECK BLOOD SUGAR TWICE A DAY 200 each 2  . hydrochlorothiazide 25 MG tablet 1 tablet by mouth once daily    . insulin glargine (LANTUS) 100 UNIT/ML injection Inject 110 Units into the skin every morning.     . insulin lispro (HUMALOG KWIKPEN) 100 UNIT/ML KiwkPen Inject 0.5 mLs (50 Units total) into the skin daily with supper. And pen needles 2/day 60 mL 3  . metoprolol (TOPROL-XL) 50 MG 24 hr tablet Take 50 mg by mouth daily.      . simvastatin (ZOCOR) 20 MG tablet Take 20 mg by mouth at bedtime.      . traZODone (DESYREL) 50 MG tablet  Take 50 mg by mouth at bedtime. Reported on 07/18/2015     No current facility-administered medications on file prior to visit.     No Known Allergies  Family History  Problem Relation Age of Onset  . Diabetes Father     BP (!) 146/78   Pulse 75   Ht _0  (1.778 m)   Wt 300 lb (136.1 kg)   BMI 43.05 kg/m   Review of Systems Denies LOC.  He has gained weight.      Objective:   Physical Exam VITAL SIGNS:  See vs page.  GENERAL: no distress. Pulses: dorsalis pedis intact bilat.  MSK: no deformity of the feet.  CV: 1+ bilat leg edema.  Skin: no ulcer on the feet. normal temp on the feet. There is patchy hyperpigmentation.  Neuro: sensation is intact to touch on the feet, but decreased from normal.    Lab Results  Component Value Date   HGBA1C 8.7 02/14/2016      Assessment & Plan:  Insulin-requiring type 2 DM: worse Gout: steroid injections are increasing cbg's.

## 2016-05-17 NOTE — Progress Notes (Deleted)
   Subjective:    Patient ID: Gabriel Alvarez, male    DOB: 12-Dec-1950, 65 y.o.   MRN: [id number]  HPI Pt returns for f/u of diabetes mellitus: DM type: insulin-requiring type 2 Dx'ed: 1314 Complications: polyneuropathy, renal insuff, CAD, CVA, and PAD.   Therapy: insulin since 2001.  DKA: never.  Severe hypoglycemia: never.   Pancreatitis: never.   Other: in 2010, he changed to a simpler insulin schedule, after poor results on multiple daily injections.  Interval history: He had a steroid injection for gout last week.  Since then, cbg's have been in the high-200's.  He brings a record of his cbg's which I have reviewed today.  Prior to the injection, it varies from 61-236.  There is no trend throughout the day.   Review of Systems     Objective:   Physical Exam VITAL SIGNS:  See vs page.  GENERAL: no distress. Pulses: dorsalis pedis intact bilat.  MSK: no deformity of the feet.  CV: 1+ bilat leg edema.  Skin: no ulcer on the feet. normal temp on the feet. There is patchy hyperpigmentation.  Neuro: sensation is intact to touch on the feet, but decreased from normal.        Assessment & Plan:  Insulin-requiring type 2 DM, with PAD: worse Gout: steroid injections are increasing cbg's

## 2016-05-18 ENCOUNTER — Encounter: Payer: Medicare Other | Admitting: Dietician

## 2016-05-18 ENCOUNTER — Ambulatory Visit: Payer: Medicare Other | Admitting: Endocrinology

## 2016-05-26 DIAGNOSIS — R0602 Shortness of breath: Secondary | ICD-10-CM | POA: Diagnosis not present

## 2016-05-26 DIAGNOSIS — Z6841 Body Mass Index (BMI) 40.0 and over, adult: Secondary | ICD-10-CM | POA: Diagnosis not present

## 2016-05-26 DIAGNOSIS — J441 Chronic obstructive pulmonary disease with (acute) exacerbation: Secondary | ICD-10-CM | POA: Diagnosis not present

## 2016-06-05 ENCOUNTER — Encounter: Payer: Self-pay | Admitting: Dietician

## 2016-06-05 ENCOUNTER — Encounter: Payer: Managed Care, Other (non HMO) | Attending: Endocrinology | Admitting: Dietician

## 2016-06-05 ENCOUNTER — Ambulatory Visit (INDEPENDENT_AMBULATORY_CARE_PROVIDER_SITE_OTHER): Payer: Managed Care, Other (non HMO) | Admitting: Endocrinology

## 2016-06-05 ENCOUNTER — Encounter: Payer: Self-pay | Admitting: Endocrinology

## 2016-06-05 ENCOUNTER — Encounter: Payer: Medicare Other | Admitting: Dietician

## 2016-06-05 VITALS — BP 146/88 | HR 65 | Ht 70.0 in | Wt 311.0 lb

## 2016-06-05 DIAGNOSIS — Z794 Long term (current) use of insulin: Secondary | ICD-10-CM | POA: Diagnosis not present

## 2016-06-05 DIAGNOSIS — E1122 Type 2 diabetes mellitus with diabetic chronic kidney disease: Secondary | ICD-10-CM | POA: Diagnosis not present

## 2016-06-05 DIAGNOSIS — Z713 Dietary counseling and surveillance: Secondary | ICD-10-CM | POA: Insufficient documentation

## 2016-06-05 DIAGNOSIS — E1151 Type 2 diabetes mellitus with diabetic peripheral angiopathy without gangrene: Secondary | ICD-10-CM | POA: Diagnosis not present

## 2016-06-05 DIAGNOSIS — N183 Chronic kidney disease, stage 3 unspecified: Secondary | ICD-10-CM

## 2016-06-05 DIAGNOSIS — E785 Hyperlipidemia, unspecified: Secondary | ICD-10-CM | POA: Insufficient documentation

## 2016-06-05 DIAGNOSIS — Z8673 Personal history of transient ischemic attack (TIA), and cerebral infarction without residual deficits: Secondary | ICD-10-CM | POA: Diagnosis not present

## 2016-06-05 DIAGNOSIS — M109 Gout, unspecified: Secondary | ICD-10-CM | POA: Insufficient documentation

## 2016-06-05 DIAGNOSIS — I129 Hypertensive chronic kidney disease with stage 1 through stage 4 chronic kidney disease, or unspecified chronic kidney disease: Secondary | ICD-10-CM | POA: Insufficient documentation

## 2016-06-05 LAB — POCT GLYCOSYLATED HEMOGLOBIN (HGB A1C): Hemoglobin A1C: 7.5

## 2016-06-05 MED ORDER — INSULIN GLARGINE 100 UNIT/ML ~~LOC~~ SOLN: 90.0000 [IU] | SUBCUTANEOUS | 11 refills | Status: DC

## 2016-06-05 MED ORDER — INSULIN LISPRO 100 UNIT/ML (KWIKPEN): 60.0000 [IU] | PEN_INJECTOR | Freq: Every day | SUBCUTANEOUS | 3 refills | Status: DC

## 2016-06-05 NOTE — Progress Notes (Signed)
Subjective:    Patient ID: Gabriel Alvarez, male    DOB: 1951-02-06, 65 y.o.   MRN: [id number]  HPI Pt returns for f/u of diabetes mellitus: DM type: insulin-requiring type 2 Dx'ed: 7795 Complications: polyneuropathy, renal insuff, CAD, CVA, and PAD.   Therapy: insulin since 2001.  DKA: never.  Severe hypoglycemia: never.   Pancreatitis: never.   Other: in 2010, he changed to a BID insulin schedule, after poor results on multiple daily injections.  Interval history: last steroid injection and oral were both last week.  He brings a record of his cbg's which I have reviewed today.  It varies from 58-200's.  Past Medical History:  Diagnosis Date  . CEREBROVASCULAR ACCIDENT, ACUTE 09/05/2008  . CORONARY ARTERY DISEASE 12/30/2006  . DIABETES MELLITUS, TYPE II 12/30/2006  . DM neuropathy, type II diabetes mellitus ([place])   . ED (erectile dysfunction)   . GOUT 07/30/2008  . HYPERCHOLESTEROLEMIA 07/30/2008  . HYPERTENSION 12/30/2006    Past Surgical History:  Procedure Laterality Date  . hand sursgery  02/2102   right hand, 8 stitches    Social History   Social History  . Marital status: Married    Spouse name: N/A  . Number of children: N/A  . Years of education: N/A   Occupational History  .      disabled   Social History Main Topics  . Smoking status: Current Every Day Smoker    Packs/day: 1.00    Years: 30.00    Types: Cigarettes, Cigars  . Smokeless tobacco: Never Used     Comment: has stopped smoking cigarettes since last hospital stay  . Alcohol use No  . Drug use: No  . Sexual activity: Not on file   Other Topics Concern  . Not on file   Social History Narrative  . No narrative on file    Current Outpatient Prescriptions on File Prior to Visit  Medication Sig Dispense Refill  . allopurinol (ZYLOPRIM) 100 MG tablet     . aspirin 325 MG tablet Take 325 mg by mouth 2 (two) times daily.      . B-D ULTRAFINE III SHORT PEN 31G X 8 MM MISC USE 1 PEN NEEDLE DAILY 90  each 2  . BD INSULIN SYRINGE ULTRAFINE 31G X 5/16" 1 ML MISC USE 2 SYRINGES PER DAY 180 each 2  . BD ULTRA-FINE PEN NEEDLES 29G X 12.7MM MISC USE 1 PER DAY 1 each 10  . clopidogrel (PLAVIX) 75 MG tablet Take 75 mg by mouth daily.      Marland Kitchen COLCRYS 0.6 MG tablet     . enalapril (VASOTEC) 20 MG tablet Take 20 mg by mouth daily.      Marland Kitchen glucose blood (ONETOUCH VERIO) test strip USE TO CHECK BLOOD SUGAR TWICE A DAY 200 each 2  . hydrochlorothiazide 25 MG tablet 1 tablet by mouth once daily    . metoprolol (TOPROL-XL) 50 MG 24 hr tablet Take 50 mg by mouth daily.      . simvastatin (ZOCOR) 20 MG tablet Take 20 mg by mouth at bedtime.      . traZODone (DESYREL) 50 MG tablet Take 50 mg by mouth at bedtime. Reported on 07/18/2015     No current facility-administered medications on file prior to visit.     No Known Allergies  Family History  Problem Relation Age of Onset  . Diabetes Father     BP (!) 146/88   Pulse 65   Ht 5'  10" (1.778 m)   Wt (!) 311 lb (141.1 kg)   SpO2 (!) 83% Comment: Patient did not bring O2  BMI 44.62 kg/m    Review of Systems Denies LOC    Objective:   Physical Exam VITAL SIGNS:  See vs page (on chronic 02, but did not bring today).    GENERAL: no distress. Pulses: dorsalis pedis intact bilat.  MSK: no deformity of the feet.  CV: 1+ bilat leg edema.  Skin: no ulcer on the feet. normal temp on the feet. There is patchy hyperpigmentation.  Neuro: sensation is intact to touch on the feet, but decreased from normal.   Lab Results  Component Value Date   HGBA1C 7.5 06/05/2016      Assessment & Plan:  Insulin-requiring type 2 DM, with renal insuff.  Overcontrolled, given this regimen, which does match insulin to his changing needs throughout the day Gout, worse: steroids are affecting this a1c, so he should reduce rx.    Patient is advised the following: Patient Instructions  Please change the insulin to the numbers listed below. check your blood  sugar twice a day.  vary the time of day when you check, between before the 3 meals, and at bedtime.  also check if you have symptoms of your blood sugar being too high or too low.  please keep a record of the readings and bring it to your next appointment here (or you can bring the meter itself).  You can write it on any piece of paper.  please call us sooner if your blood sugar goes below 70, or if you have a lot of readings over 200.  You should take your oxygen with you wherever you go.  It helps you feel better and live longer.   Please come back for a follow-up appointment in 3 months.

## 2016-06-05 NOTE — Patient Instructions (Signed)
Please change the insulin to the numbers listed below. check your blood sugar twice a day.  vary the time of day when you check, between before the 3 meals, and at bedtime.  also check if you have symptoms of your blood sugar being too high or too low.  please keep a record of the readings and bring it to your next appointment here (or you can bring the meter itself).  You can write it on any piece of paper.  please call us sooner if your blood sugar goes below 70, or if you have a lot of readings over 200.  You should take your oxygen with you wherever you go.  It helps you feel better and live longer.   Please come back for a follow-up appointment in 3 months.

## 2016-06-05 NOTE — Patient Instructions (Signed)
When you are feeling better, start walking.  Walk 5 minutes several times per day.  Increase the walking as you are able. Bake broil grill rather than fried. When eating out, downsize Eat more non starchy vegetables. Small amounts of protein each time you eat.

## 2016-06-05 NOTE — Progress Notes (Signed)
  Medical Nutrition Therapy:  Appt start time: 1050 end time:  1125. Time limited due to patient not feeling well.   Assessment:  Primary concerns today: Patient is here with his wife.  He wants to lose weight.  Weight today 311 lbs and states that this is his highest weight.  Weight 2 years ago per patient about 200 lbs.  He just got off steroids for bronchitis and is trying to stop smoking.  He is coughing a lot during this appointment, water provided, O2 sat checked which was 70-84.  He refused to go to the hospital.  Discussed with nurse in the office, he missed breathing treatment today and did not bring his O2.  He has been instructed to wear his oxygen all of the time now.  Other hx includes HTN, stage 3 CKD, type 2 diabetes since 2000 and on insulin since 2001, hyperlipidemia, gout and CVA 6-7 years ago. A1C 7.5% 06/05/16 decreased from 8.7% 02/14/16.  He states that he has been experiencing a low blood sugar daily and does not know except for tid CBG's.  It is often 64-70 each am.   Patient lives with his wife.  She does the shopping and cooking.  Eats out often.  He is retired from a Actor.  His wife works second shift.  Preferred Learning Style:   No preference indicated   Learning Readiness:   Not ready  MEDICATIONS: see list to include 100 units lantus each am (states that he was told to decrease this to 90 units today), Humalog 50 units each 8pm.   DIETARY INTAKE:  Usual eating pattern includes 2 meals and 1 snacks per day.  Everyday foods include fast food.    24-hr recall:  B (8:30-10AM): eggs, sausage, white wheat toast OR honey nut cheerios and milk  Snk ( AM): none  L ( PM): skips Snk ( PM): none D ( PM): 2 [employer] burgers (often fast food) Snk ( PM): banana  Beverages: water, coffee with diet sugar and diet creamer, G2 gatorade  Usual physical activity: none.  He used to walk 5 miles around the track most days in the past but health has currently prohibited  exercise.  Estimated energy needs: 2200 calories 248 g carbohydrates 138 g protein 73 g fat  Progress Towards Goal(s):  In progress.   Nutritional Diagnosis:  NB-1.1 Food and nutrition-related knowledge deficit As related to balance of carbohydrates, protein, and fat.  As evidenced by diet hx and patient report.    Intervention:  Nutrition education related to the basic diabetic diet for weight management.  Discussed My Plate, importance of regular meal schedule, protein with meals/snacks, and healthy food choices when eating out.    When you are feeling better, start walking.  Walk 5 minutes several times per day.  Increase the walking as you are able. Bake broil grill rather than fried. When eating out, downsize Eat more non starchy vegetables. Small amounts of protein each time you eat.  Teaching Method Utilized:  Visual Auditory Hands on  Handouts given during visit include:  My plate  Meal plan card  Making healthy fast food choices  Support group  Barriers to learning/adherence to lifestyle change: health problems  Demonstrated degree of understanding via:  Teach Back   Monitoring/Evaluation:  Dietary intake, exercise, and body weight in 1 month(s).  Recommended.

## 2016-06-11 ENCOUNTER — Other Ambulatory Visit: Payer: Self-pay

## 2016-06-11 MED ORDER — INSULIN GLARGINE 100 UNIT/ML ~~LOC~~ SOLN: 90.0000 [IU] | SUBCUTANEOUS | 2 refills | Status: DC

## 2016-06-16 ENCOUNTER — Other Ambulatory Visit: Payer: Self-pay

## 2016-06-16 MED ORDER — INSULIN LISPRO 100 UNIT/ML (KWIKPEN): 60.0000 [IU] | PEN_INJECTOR | Freq: Every day | SUBCUTANEOUS | 3 refills | Status: AC

## 2016-06-16 MED ORDER — INSULIN GLARGINE 100 UNIT/ML ~~LOC~~ SOLN: 90.0000 [IU] | SUBCUTANEOUS | 2 refills | Status: AC

## 2016-06-21 DIAGNOSIS — I110 Hypertensive heart disease with heart failure: Secondary | ICD-10-CM | POA: Diagnosis not present

## 2016-06-21 DIAGNOSIS — I5043 Acute on chronic combined systolic (congestive) and diastolic (congestive) heart failure: Secondary | ICD-10-CM | POA: Diagnosis not present

## 2016-06-21 DIAGNOSIS — J90 Pleural effusion, not elsewhere classified: Secondary | ICD-10-CM | POA: Diagnosis not present

## 2016-06-21 DIAGNOSIS — I10 Essential (primary) hypertension: Secondary | ICD-10-CM | POA: Diagnosis not present

## 2016-06-21 DIAGNOSIS — N19 Unspecified kidney failure: Secondary | ICD-10-CM | POA: Diagnosis not present

## 2016-06-21 DIAGNOSIS — R0600 Dyspnea, unspecified: Secondary | ICD-10-CM | POA: Diagnosis not present

## 2016-06-21 DIAGNOSIS — I2510 Atherosclerotic heart disease of native coronary artery without angina pectoris: Secondary | ICD-10-CM | POA: Diagnosis not present

## 2016-06-21 DIAGNOSIS — E119 Type 2 diabetes mellitus without complications: Secondary | ICD-10-CM | POA: Diagnosis not present

## 2016-06-21 DIAGNOSIS — I509 Heart failure, unspecified: Secondary | ICD-10-CM | POA: Diagnosis not present

## 2016-06-21 DIAGNOSIS — J9601 Acute respiratory failure with hypoxia: Secondary | ICD-10-CM | POA: Diagnosis not present

## 2016-06-21 DIAGNOSIS — J181 Lobar pneumonia, unspecified organism: Secondary | ICD-10-CM | POA: Diagnosis not present

## 2016-06-21 DIAGNOSIS — R0902 Hypoxemia: Secondary | ICD-10-CM | POA: Diagnosis not present

## 2016-06-21 DIAGNOSIS — S4991XA Unspecified injury of right shoulder and upper arm, initial encounter: Secondary | ICD-10-CM | POA: Diagnosis not present

## 2016-06-21 DIAGNOSIS — R0602 Shortness of breath: Secondary | ICD-10-CM | POA: Diagnosis not present

## 2016-06-23 ENCOUNTER — Inpatient Hospital Stay (HOSPITAL_COMMUNITY): Payer: Managed Care, Other (non HMO)

## 2016-06-23 ENCOUNTER — Inpatient Hospital Stay (HOSPITAL_COMMUNITY)
Admission: AD | Admit: 2016-06-23 | Discharge: 2016-07-03 | DRG: 286 | Disposition: A | Discharge status: Home Health | Payer: Managed Care, Other (non HMO) | Source: Other Acute Inpatient Hospital | Attending: Internal Medicine | Admitting: Internal Medicine

## 2016-06-23 ENCOUNTER — Encounter (HOSPITAL_COMMUNITY): Payer: Self-pay

## 2016-06-23 DIAGNOSIS — I2510 Atherosclerotic heart disease of native coronary artery without angina pectoris: Secondary | ICD-10-CM | POA: Diagnosis not present

## 2016-06-23 DIAGNOSIS — R0602 Shortness of breath: Secondary | ICD-10-CM | POA: Diagnosis not present

## 2016-06-23 DIAGNOSIS — Z9981 Dependence on supplemental oxygen: Secondary | ICD-10-CM

## 2016-06-23 DIAGNOSIS — I160 Hypertensive urgency: Principal | ICD-10-CM | POA: Diagnosis present

## 2016-06-23 DIAGNOSIS — Z6841 Body Mass Index (BMI) 40.0 and over, adult: Secondary | ICD-10-CM

## 2016-06-23 DIAGNOSIS — N179 Acute kidney failure, unspecified: Secondary | ICD-10-CM | POA: Diagnosis not present

## 2016-06-23 DIAGNOSIS — Y831 Surgical operation with implant of artificial internal device as the cause of abnormal reaction of the patient, or of later complication, without mention of misadventure at the time of the procedure: Secondary | ICD-10-CM | POA: Diagnosis not present

## 2016-06-23 DIAGNOSIS — E875 Hyperkalemia: Secondary | ICD-10-CM | POA: Diagnosis present

## 2016-06-23 DIAGNOSIS — F17210 Nicotine dependence, cigarettes, uncomplicated: Secondary | ICD-10-CM | POA: Diagnosis present

## 2016-06-23 DIAGNOSIS — R590 Localized enlarged lymph nodes: Secondary | ICD-10-CM | POA: Diagnosis not present

## 2016-06-23 DIAGNOSIS — I255 Ischemic cardiomyopathy: Secondary | ICD-10-CM | POA: Diagnosis present

## 2016-06-23 DIAGNOSIS — T82855A Stenosis of coronary artery stent, initial encounter: Secondary | ICD-10-CM | POA: Diagnosis not present

## 2016-06-23 DIAGNOSIS — Z720 Tobacco use: Secondary | ICD-10-CM | POA: Diagnosis present

## 2016-06-23 DIAGNOSIS — Z833 Family history of diabetes mellitus: Secondary | ICD-10-CM

## 2016-06-23 DIAGNOSIS — E1151 Type 2 diabetes mellitus with diabetic peripheral angiopathy without gangrene: Secondary | ICD-10-CM | POA: Diagnosis present

## 2016-06-23 DIAGNOSIS — I5041 Acute combined systolic (congestive) and diastolic (congestive) heart failure: Secondary | ICD-10-CM | POA: Diagnosis not present

## 2016-06-23 DIAGNOSIS — J9621 Acute and chronic respiratory failure with hypoxia: Secondary | ICD-10-CM | POA: Diagnosis present

## 2016-06-23 DIAGNOSIS — Z79899 Other long term (current) drug therapy: Secondary | ICD-10-CM

## 2016-06-23 DIAGNOSIS — J849 Interstitial pulmonary disease, unspecified: Secondary | ICD-10-CM

## 2016-06-23 DIAGNOSIS — J9622 Acute and chronic respiratory failure with hypercapnia: Secondary | ICD-10-CM | POA: Diagnosis present

## 2016-06-23 DIAGNOSIS — N183 Chronic kidney disease, stage 3 (moderate): Secondary | ICD-10-CM | POA: Diagnosis not present

## 2016-06-23 DIAGNOSIS — I2721 Secondary pulmonary arterial hypertension: Secondary | ICD-10-CM | POA: Diagnosis present

## 2016-06-23 DIAGNOSIS — N170 Acute kidney failure with tubular necrosis: Secondary | ICD-10-CM | POA: Diagnosis not present

## 2016-06-23 DIAGNOSIS — I509 Heart failure, unspecified: Secondary | ICD-10-CM

## 2016-06-23 DIAGNOSIS — N184 Chronic kidney disease, stage 4 (severe): Secondary | ICD-10-CM | POA: Diagnosis present

## 2016-06-23 DIAGNOSIS — J9811 Atelectasis: Secondary | ICD-10-CM | POA: Diagnosis not present

## 2016-06-23 DIAGNOSIS — D860 Sarcoidosis of lung: Secondary | ICD-10-CM | POA: Diagnosis present

## 2016-06-23 DIAGNOSIS — E1165 Type 2 diabetes mellitus with hyperglycemia: Secondary | ICD-10-CM | POA: Diagnosis present

## 2016-06-23 DIAGNOSIS — I130 Hypertensive heart and chronic kidney disease with heart failure and stage 1 through stage 4 chronic kidney disease, or unspecified chronic kidney disease: Secondary | ICD-10-CM | POA: Diagnosis present

## 2016-06-23 DIAGNOSIS — I248 Other forms of acute ischemic heart disease: Secondary | ICD-10-CM | POA: Diagnosis present

## 2016-06-23 DIAGNOSIS — J449 Chronic obstructive pulmonary disease, unspecified: Secondary | ICD-10-CM | POA: Diagnosis not present

## 2016-06-23 DIAGNOSIS — E1140 Type 2 diabetes mellitus with diabetic neuropathy, unspecified: Secondary | ICD-10-CM | POA: Diagnosis present

## 2016-06-23 DIAGNOSIS — I5043 Acute on chronic combined systolic (congestive) and diastolic (congestive) heart failure: Secondary | ICD-10-CM | POA: Diagnosis not present

## 2016-06-23 DIAGNOSIS — J9690 Respiratory failure, unspecified, unspecified whether with hypoxia or hypercapnia: Secondary | ICD-10-CM | POA: Diagnosis not present

## 2016-06-23 DIAGNOSIS — I5031 Acute diastolic (congestive) heart failure: Secondary | ICD-10-CM | POA: Diagnosis not present

## 2016-06-23 DIAGNOSIS — I5021 Acute systolic (congestive) heart failure: Secondary | ICD-10-CM | POA: Diagnosis present

## 2016-06-23 DIAGNOSIS — E662 Morbid (severe) obesity with alveolar hypoventilation: Secondary | ICD-10-CM | POA: Diagnosis not present

## 2016-06-23 DIAGNOSIS — E1122 Type 2 diabetes mellitus with diabetic chronic kidney disease: Secondary | ICD-10-CM | POA: Diagnosis not present

## 2016-06-23 DIAGNOSIS — Z8673 Personal history of transient ischemic attack (TIA), and cerebral infarction without residual deficits: Secondary | ICD-10-CM

## 2016-06-23 DIAGNOSIS — Z7902 Long term (current) use of antithrombotics/antiplatelets: Secondary | ICD-10-CM

## 2016-06-23 DIAGNOSIS — M109 Gout, unspecified: Secondary | ICD-10-CM | POA: Diagnosis present

## 2016-06-23 DIAGNOSIS — E0865 Diabetes mellitus due to underlying condition with hyperglycemia: Secondary | ICD-10-CM

## 2016-06-23 DIAGNOSIS — Z794 Long term (current) use of insulin: Secondary | ICD-10-CM

## 2016-06-23 DIAGNOSIS — Z955 Presence of coronary angioplasty implant and graft: Secondary | ICD-10-CM

## 2016-06-23 DIAGNOSIS — Z7982 Long term (current) use of aspirin: Secondary | ICD-10-CM

## 2016-06-23 DIAGNOSIS — N049 Nephrotic syndrome with unspecified morphologic changes: Secondary | ICD-10-CM | POA: Diagnosis not present

## 2016-06-23 DIAGNOSIS — I272 Pulmonary hypertension, unspecified: Secondary | ICD-10-CM | POA: Diagnosis not present

## 2016-06-23 DIAGNOSIS — J9620 Acute and chronic respiratory failure, unspecified whether with hypoxia or hypercapnia: Secondary | ICD-10-CM | POA: Diagnosis present

## 2016-06-23 DIAGNOSIS — I252 Old myocardial infarction: Secondary | ICD-10-CM

## 2016-06-23 DIAGNOSIS — J811 Chronic pulmonary edema: Secondary | ICD-10-CM | POA: Diagnosis present

## 2016-06-23 DIAGNOSIS — J9601 Acute respiratory failure with hypoxia: Secondary | ICD-10-CM | POA: Diagnosis not present

## 2016-06-23 DIAGNOSIS — E11649 Type 2 diabetes mellitus with hypoglycemia without coma: Secondary | ICD-10-CM | POA: Diagnosis not present

## 2016-06-23 MED ORDER — ORAL CARE MOUTH RINSE: 15.0000 mL | Freq: Two times a day (BID) | OROMUCOSAL | Status: DC

## 2016-06-24 ENCOUNTER — Encounter (HOSPITAL_COMMUNITY): Payer: Self-pay | Admitting: Internal Medicine

## 2016-06-24 DIAGNOSIS — I2510 Atherosclerotic heart disease of native coronary artery without angina pectoris: Secondary | ICD-10-CM

## 2016-06-24 DIAGNOSIS — I160 Hypertensive urgency: Secondary | ICD-10-CM | POA: Diagnosis present

## 2016-06-24 DIAGNOSIS — J9601 Acute respiratory failure with hypoxia: Secondary | ICD-10-CM

## 2016-06-24 DIAGNOSIS — I5021 Acute systolic (congestive) heart failure: Secondary | ICD-10-CM | POA: Diagnosis present

## 2016-06-24 DIAGNOSIS — J9620 Acute and chronic respiratory failure, unspecified whether with hypoxia or hypercapnia: Secondary | ICD-10-CM | POA: Diagnosis present

## 2016-06-24 DIAGNOSIS — N179 Acute kidney failure, unspecified: Secondary | ICD-10-CM

## 2016-06-24 DIAGNOSIS — E1151 Type 2 diabetes mellitus with diabetic peripheral angiopathy without gangrene: Secondary | ICD-10-CM

## 2016-06-24 DIAGNOSIS — N184 Chronic kidney disease, stage 4 (severe): Secondary | ICD-10-CM

## 2016-06-24 DIAGNOSIS — R0602 Shortness of breath: Secondary | ICD-10-CM

## 2016-06-24 DIAGNOSIS — I509 Heart failure, unspecified: Secondary | ICD-10-CM

## 2016-06-24 DIAGNOSIS — I429 Cardiomyopathy, unspecified: Secondary | ICD-10-CM

## 2016-06-24 LAB — BLOOD GAS, ARTERIAL
Acid-Base Excess: 0.7 mmol/L (ref 0.0–2.0)
Bicarbonate: 25.6 mmol/L (ref 20.0–28.0)
Drawn by: 437071
O2 Content: 6 L/min
O2 Saturation: 92.3 %
PCO2 ART: 47.4 mmHg (ref 32.0–48.0)
PH ART: 7.351 (ref 7.350–7.450)
PO2 ART: 72.1 mmHg — AB (ref 83.0–108.0)
Patient temperature: 98.6

## 2016-06-24 LAB — MAGNESIUM: MAGNESIUM: 1.8 mg/dL (ref 1.7–2.4)

## 2016-06-24 LAB — MRSA PCR SCREENING: MRSA by PCR: NEGATIVE

## 2016-06-24 LAB — COMPREHENSIVE METABOLIC PANEL
ALT: 26 U/L (ref 17–63)
AST: 32 U/L (ref 15–41)
Albumin: 1.9 g/dL — ABNORMAL LOW (ref 3.5–5.0)
Alkaline Phosphatase: 72 U/L (ref 38–126)
Anion gap: 14 (ref 5–15)
BUN: 42 mg/dL — AB (ref 6–20)
CHLORIDE: 105 mmol/L (ref 101–111)
CO2: 20 mmol/L — ABNORMAL LOW (ref 22–32)
CREATININE: 3.07 mg/dL — AB (ref 0.61–1.24)
Calcium: 8 mg/dL — ABNORMAL LOW (ref 8.9–10.3)
GFR calc Af Amer: 23 mL/min — ABNORMAL LOW (ref >60)
GFR, EST NON AFRICAN AMERICAN: 20 mL/min — AB (ref >60)
Glucose, Bld: 281 mg/dL — ABNORMAL HIGH (ref 65–99)
Potassium: 4.7 mmol/L (ref 3.5–5.1)
Sodium: 139 mmol/L (ref 135–145)
Total Bilirubin: 0.4 mg/dL (ref 0.3–1.2)
Total Protein: 5 g/dL — ABNORMAL LOW (ref 6.5–8.1)

## 2016-06-24 LAB — URINALYSIS, ROUTINE W REFLEX MICROSCOPIC
BACTERIA UA: NONE SEEN
BILIRUBIN URINE: NEGATIVE
GLUCOSE, UA: 150 mg/dL — AB
Ketones, ur: NEGATIVE mg/dL
LEUKOCYTES UA: NEGATIVE
Nitrite: NEGATIVE
SQUAMOUS EPITHELIAL / LPF: NONE SEEN
Specific Gravity, Urine: 1.008 (ref 1.005–1.030)
pH: 6 (ref 5.0–8.0)

## 2016-06-24 LAB — CBC WITH DIFFERENTIAL/PLATELET
BASOS PCT: 0 %
Basophils Absolute: 0 10*3/uL (ref 0.0–0.1)
EOS PCT: 0 %
Eosinophils Absolute: 0 10*3/uL (ref 0.0–0.7)
HCT: 43.5 % (ref 39.0–52.0)
Hemoglobin: 14.3 g/dL (ref 13.0–17.0)
LYMPHS ABS: 1.1 10*3/uL (ref 0.7–4.0)
Lymphocytes Relative: 7 %
MCH: 26.6 pg (ref 26.0–34.0)
MCHC: 32.9 g/dL (ref 30.0–36.0)
MCV: 81 fL (ref 78.0–100.0)
MONO ABS: 0.8 10*3/uL (ref 0.1–1.0)
Monocytes Relative: 5 %
NEUTROS ABS: 13.1 10*3/uL — AB (ref 1.7–7.7)
Neutrophils Relative %: 88 %
Platelets: 178 10*3/uL (ref 150–400)
RBC: 5.37 MIL/uL (ref 4.22–5.81)
RDW: 17.8 % — AB (ref 11.5–15.5)
WBC: 15 10*3/uL — AB (ref 4.0–10.5)

## 2016-06-24 LAB — BASIC METABOLIC PANEL
ANION GAP: 8 (ref 5–15)
BUN: 42 mg/dL — ABNORMAL HIGH (ref 6–20)
CO2: 23 mmol/L (ref 22–32)
Calcium: 7.9 mg/dL — ABNORMAL LOW (ref 8.9–10.3)
Chloride: 110 mmol/L (ref 101–111)
Creatinine, Ser: 2.85 mg/dL — ABNORMAL HIGH (ref 0.61–1.24)
GFR calc non Af Amer: 22 mL/min — ABNORMAL LOW (ref >60)
GFR, EST AFRICAN AMERICAN: 25 mL/min — AB (ref >60)
GLUCOSE: 176 mg/dL — AB (ref 65–99)
POTASSIUM: 4.8 mmol/L (ref 3.5–5.1)
Sodium: 141 mmol/L (ref 135–145)

## 2016-06-24 LAB — BRAIN NATRIURETIC PEPTIDE: B Natriuretic Peptide: 355 pg/mL — ABNORMAL HIGH (ref 0.0–100.0)

## 2016-06-24 LAB — CBC
HEMATOCRIT: 44.6 % (ref 39.0–52.0)
HEMOGLOBIN: 14.5 g/dL (ref 13.0–17.0)
MCH: 26 pg (ref 26.0–34.0)
MCHC: 32.5 g/dL (ref 30.0–36.0)
MCV: 80.1 fL (ref 78.0–100.0)
Platelets: 232 10*3/uL (ref 150–400)
RBC: 5.57 MIL/uL (ref 4.22–5.81)
RDW: 17.2 % — ABNORMAL HIGH (ref 11.5–15.5)
WBC: 16.2 10*3/uL — ABNORMAL HIGH (ref 4.0–10.5)

## 2016-06-24 LAB — TROPONIN I
Troponin I: 0.13 ng/mL (ref <0.03)
Troponin I: 0.28 ng/mL (ref <0.03)
Troponin I: 0.29 ng/mL (ref <0.03)

## 2016-06-24 LAB — GLUCOSE, CAPILLARY
GLUCOSE-CAPILLARY: 244 mg/dL — AB (ref 65–99)
GLUCOSE-CAPILLARY: 384 mg/dL — AB (ref 65–99)
GLUCOSE-CAPILLARY: 220 mg/dL — AB (ref 65–99)
Glucose-Capillary: 180 mg/dL — ABNORMAL HIGH (ref 65–99)

## 2016-06-24 LAB — TSH: TSH: 2.923 u[IU]/mL (ref 0.350–4.500)

## 2016-06-24 LAB — CREATININE, URINE, RANDOM: CREATININE, URINE: 36.68 mg/dL

## 2016-06-24 LAB — SODIUM, URINE, RANDOM: Sodium, Ur: 104 mmol/L

## 2016-06-24 MED ORDER — ALBUTEROL SULFATE (2.5 MG/3ML) 0.083% IN NEBU: 2.5000 mg | INHALATION_SOLUTION | RESPIRATORY_TRACT | Status: DC | PRN

## 2016-06-24 MED ORDER — HYDRALAZINE HCL 20 MG/ML IJ SOLN
10.0000 mg | INTRAMUSCULAR | Status: DC | PRN
  Administered 2016-06-28: 10 mg via INTRAVENOUS
  Filled 2016-06-24: qty 1

## 2016-06-24 MED ORDER — CARVEDILOL 6.25 MG PO TABS
6.2500 mg | ORAL_TABLET | Freq: Two times a day (BID) | ORAL | Status: DC
Start: 2016-06-24 — End: 2016-06-30
  Administered 2016-06-24 – 2016-06-29 (×11): 6.25 mg via ORAL
  Filled 2016-06-24 (×11): qty 1

## 2016-06-24 MED ORDER — HYDRALAZINE HCL 50 MG PO TABS
50.0000 mg | ORAL_TABLET | Freq: Three times a day (TID) | ORAL | Status: DC
  Administered 2016-06-24 – 2016-06-29 (×17): 50 mg via ORAL
  Filled 2016-06-24 (×17): qty 1

## 2016-06-24 MED ORDER — ONDANSETRON HCL 4 MG PO TABS: 4.0000 mg | ORAL_TABLET | Freq: Four times a day (QID) | ORAL | Status: DC | PRN

## 2016-06-24 MED ORDER — ACETAMINOPHEN 325 MG PO TABS
650.0000 mg | ORAL_TABLET | Freq: Four times a day (QID) | ORAL | Status: DC | PRN
Start: 2016-06-24 — End: 2016-07-02
  Administered 2016-06-28: 650 mg via ORAL
  Filled 2016-06-24 (×2): qty 2

## 2016-06-24 MED ORDER — ACETAMINOPHEN 650 MG RE SUPP: 650.0000 mg | Freq: Four times a day (QID) | RECTAL | Status: DC | PRN

## 2016-06-24 MED ORDER — METOPROLOL SUCCINATE ER 50 MG PO TB24
50.0000 mg | ORAL_TABLET | Freq: Every day | ORAL | Status: DC
  Administered 2016-06-24: 50 mg via ORAL
  Filled 2016-06-24: qty 1

## 2016-06-24 MED ORDER — INSULIN ASPART 100 UNIT/ML ~~LOC~~ SOLN
0.0000 [IU] | Freq: Three times a day (TID) | SUBCUTANEOUS | Status: DC
  Administered 2016-06-24: 9 [IU] via SUBCUTANEOUS
  Administered 2016-06-24: 3 [IU] via SUBCUTANEOUS
  Administered 2016-06-24: 2 [IU] via SUBCUTANEOUS
  Administered 2016-06-25 – 2016-06-26 (×2): 9 [IU] via SUBCUTANEOUS
  Administered 2016-06-26: 1 [IU] via SUBCUTANEOUS
  Administered 2016-06-27: 7 [IU] via SUBCUTANEOUS
  Administered 2016-06-27: 1 [IU] via SUBCUTANEOUS
  Administered 2016-06-27: 3 [IU] via SUBCUTANEOUS

## 2016-06-24 MED ORDER — HEPARIN SODIUM (PORCINE) 5000 UNIT/ML IJ SOLN
5000.0000 [IU] | Freq: Three times a day (TID) | INTRAMUSCULAR | Status: DC
  Administered 2016-06-24 – 2016-06-30 (×20): 5000 [IU] via SUBCUTANEOUS
  Filled 2016-06-24 (×20): qty 1

## 2016-06-24 MED ORDER — CLOPIDOGREL BISULFATE 75 MG PO TABS
75.0000 mg | ORAL_TABLET | Freq: Every day | ORAL | Status: DC
  Administered 2016-06-24 – 2016-07-03 (×10): 75 mg via ORAL
  Filled 2016-06-24 (×10): qty 1

## 2016-06-24 MED ORDER — FUROSEMIDE 10 MG/ML IJ SOLN
80.0000 mg | Freq: Two times a day (BID) | INTRAMUSCULAR | Status: DC
  Administered 2016-06-25 – 2016-06-29 (×9): 80 mg via INTRAVENOUS
  Filled 2016-06-24 (×9): qty 8

## 2016-06-24 MED ORDER — PREDNISONE 20 MG PO TABS
40.0000 mg | ORAL_TABLET | Freq: Every day | ORAL | Status: DC
  Administered 2016-06-24 – 2016-07-02 (×9): 40 mg via ORAL
  Filled 2016-06-24: qty 2
  Filled 2016-06-24: qty 4
  Filled 2016-06-24 (×7): qty 2

## 2016-06-24 MED ORDER — FUROSEMIDE 10 MG/ML IJ SOLN
60.0000 mg | Freq: Two times a day (BID) | INTRAMUSCULAR | Status: DC
  Administered 2016-06-24 (×2): 60 mg via INTRAVENOUS
  Filled 2016-06-24 (×2): qty 6

## 2016-06-24 MED ORDER — SIMVASTATIN 20 MG PO TABS
20.0000 mg | ORAL_TABLET | Freq: Every day | ORAL | Status: DC
  Administered 2016-06-24 – 2016-07-02 (×10): 20 mg via ORAL
  Filled 2016-06-24 (×10): qty 1

## 2016-06-24 MED ORDER — ASPIRIN 325 MG PO TABS
325.0000 mg | ORAL_TABLET | Freq: Two times a day (BID) | ORAL | Status: DC
  Administered 2016-06-24 (×3): 325 mg via ORAL
  Filled 2016-06-24 (×3): qty 1

## 2016-06-24 MED ORDER — INSULIN GLARGINE 100 UNIT/ML ~~LOC~~ SOLN
100.0000 [IU] | Freq: Every day | SUBCUTANEOUS | Status: DC
  Administered 2016-06-24 (×2): 100 [IU] via SUBCUTANEOUS
  Filled 2016-06-24 (×2): qty 1

## 2016-06-24 MED ORDER — ONDANSETRON HCL 4 MG/2ML IJ SOLN: 4.0000 mg | Freq: Four times a day (QID) | INTRAMUSCULAR | Status: DC | PRN

## 2016-06-24 NOTE — Consult Note (Addendum)
Cardiology Consult    Patient ID: Gabriel Alvarez MRN: [id number], DOB/AGE: 1951/01/06   Admit date: 06/23/2016 Date of Consult: 06/24/2016  Primary Physician: Manon Hilding, MD Reason for Consult: CHF Primary Cardiologist: New Requesting Provider: Dr. Waldron Labs  Patient Profile    Gabriel Alvarez is a 66 year old male with a past medical history of chronic combined systolic and diastolic CHF, DM, and CKD stage IV. Presented to [facility] with SOB and weight gain. Echo showed EF of 15% and he was transferred to [facility] for further evaluation.                                                                                                                                             History of Present Illness    Gabriel Alvarez tells me that he has been gaining weight for about one month now. He says he has gained 80 pounds in the last 30 days. He has noticed increased shortness of breath with activity especially his daily activities including showering and dressing. Particularly in the last week he is become more dyspneic with exertion and felt more fatigued.  He says that he has had a heart attack in the past about 20 years ago that did result in stenting. He has not had any formal cardiology follow-up since then. He presented to [facility] on 06/21/2016 with shortness of breath and was found to have a proBNP of 5703 and a chest x-ray consistent with increased interstitial edema. His troponin was also elevated at 0.13. EKG showed NSR with some diffuse nonspecific ST changes. He was hypoxic requiring BiPAP on 1-18 and was transferred to [facility] on 06/24/2016.   Echocardiogram was done on 06/22/2016 and showed moderate concentric left ventricular hypertrophy with an LVEF of 15-20%. Also with grade 2 diastolic dysfunction. He had normal biatrial size.Last echo in 2013 with normal LV function.   He tells me that before a month ago he was able to get out and do things like shopping without any shortness of  breath. He is on home O2 at 3 L.  At the time of my encounter.   Chest CT showed ground glass opacities favoring sarcoidosis.   He endorses orthopnea denies PND.  Past Medical History   Past Medical History:  Diagnosis Date  . CEREBROVASCULAR ACCIDENT, ACUTE 09/05/2008  . CORONARY ARTERY DISEASE 12/30/2006  . DIABETES MELLITUS, TYPE II 12/30/2006  . DM neuropathy, type II diabetes mellitus ([place])   . ED (erectile dysfunction)   . GOUT 07/30/2008  . HYPERCHOLESTEROLEMIA 07/30/2008  . HYPERTENSION 12/30/2006    Past Surgical History:  Procedure Laterality Date  . hand sursgery  02/2102   right hand, 8 stitches     Allergies  No Known Allergies  Inpatient Medications    . aspirin  325 mg Oral BID  . clopidogrel  75 mg Oral Daily  .  furosemide  60 mg Intravenous Q12H  . heparin  5,000 Units Subcutaneous Q8H  . hydrALAZINE  50 mg Oral Q8H  . insulin aspart  0-9 Units Subcutaneous TID WC  . insulin glargine  100 Units Subcutaneous QHS  . metoprolol succinate  50 mg Oral Daily  . predniSONE  40 mg Oral Q breakfast  . simvastatin  20 mg Oral QHS    Family History    Family History  Problem Relation Age of Onset  . Diabetes Father     Social History    Social History   Social History  . Marital status: Married    Spouse name: N/A  . Number of children: N/A  . Years of education: N/A   Occupational History  .      disabled   Social History Main Topics  . Smoking status: Current Every Day Smoker    Packs/day: 1.00    Years: 30.00    Types: Cigarettes, Cigars  . Smokeless tobacco: Never Used     Comment: has stopped smoking cigarettes since last hospital stay  . Alcohol use No  . Drug use: No  . Sexual activity: Not on file   Other Topics Concern  . Not on file   Social History Narrative  . No narrative on file     Review of Systems    General:  No chills, fever, night sweats or weight changes.  Cardiovascular:  No chest pain, +dyspnea on exertion,  +edema, +orthopnea, palpitations, paroxysmal nocturnal dyspnea. Dermatological: No rash, lesions/masses Respiratory: No cough, dyspnea Urologic: No hematuria, dysuria Abdominal:   No nausea, vomiting, diarrhea, bright red blood per rectum, melena, or hematemesis Neurologic:  No visual changes, wkns, changes in mental status. All other systems reviewed and are otherwise negative except as noted above.  Physical Exam    Blood pressure (!) 149/83, pulse 75, temperature 98.9 F (37.2 C), temperature source Oral, resp. rate (!) 24, height _0  (1.803 m), weight (!) 319 lb 3.6 oz (144.8 kg), SpO2 (!) 89 %.  General: Pleasant, morbidly obese male  Psych: Normal affect. Neuro: Alert and oriented X 3. Moves all extremities spontaneously. HEENT: Normal  Neck: Supple without bruits or JVD. Lungs:  Resp regular and labored, bilateral upper inspiratory and expiratory wheezing.  Heart: RRR no s3, s4, or murmurs. Abdomen: Firm, protuberant, non-tender,BS + x 4.  Extremities: No clubbing, cyanosis. + 2 pedal edema. DP/PT/Radials 2+ and equal bilaterally.  Labs     Recent Labs  06/23/16 2326 06/24/16 0739 06/24/16 1149  TROPONINI 0.13* 0.28* 0.29*   Lab Results  Component Value Date   WBC 16.2 (H) 06/24/2016   HGB 14.5 06/24/2016   HCT 44.6 06/24/2016   MCV 80.1 06/24/2016   PLT 232 06/24/2016    Recent Labs Lab 06/23/16 2326 06/24/16 0739  NA 139 141  K 4.7 4.8  CL 105 110  CO2 20* 23  BUN 42* 42*  CREATININE 3.07* 2.85*  CALCIUM 8.0* 7.9*  PROT 5.0*  --   BILITOT 0.4  --   ALKPHOS 72  --   ALT 26  --   AST 32  --   GLUCOSE 281* 176*   No results found for: CHOL, HDL, LDLCALC, TRIG No results found for: [facility]   Radiology Studies    Dg Chest Port 1 View  Result Date: 06/24/2016 CLINICAL DATA:  Shortness of breath.  History of diabetes, CHF. EXAM: PORTABLE CHEST 1 VIEW COMPARISON:  CT chest June 22, 2016  FINDINGS: Cardiac silhouette is moderately enlarged,  unchanged. Mediastinal silhouette is nonsuspicious. Diffusely coarsened pulmonary interstitium. Increased lung volumes. No pleural effusion or focal consolidation. No pneumothorax. Soft tissue planes and included osseous structures are nonsuspicious. IMPRESSION: Stable cardiomegaly. Probable COPD, in addition to diffuse coarsened pulmonary interstitium better characterized on yesterday's CT chest. Electronically Signed   By: Elon Alas M.D.   On: 06/24/2016 02:19    EKG & Cardiac Imaging    EKG: NSR   Echocardiogram: In paper chart - EF 15-20%, normal RV, no valvular abnormalities.  Assessment & Plan    1. Acute on chronic combined systolic and diastolic CHF: Volume overloaded on exam, continue IV diuresis, but with creatinine of 2.85 may need to consider Renal consult.   Likely this is ischemic in etiology as he has a history of CAD, DM (last A1c was 7.2), HTN and obesity. He needs a heart cath, but not a candidate at this time as he is volume overloaded and in acute renal failure.   Consider getting CHF team to see in the am.   Would discontinue metoprolol succinate with reduced EF. Will need Coreg but might need to hold off while he acutely decompensated. Needs aggressive afterload reduction with hydralazine and nitrates. Already on hydralazine 32m q 8, will add isosorbide 30 mg daily with plans to up titrate both. Will hold ACE now with acute renal failure.   2. Acute on chronic kidney disease: Will have renal to see and provide input.   3. Elevated Troponin: Troponin is 0.13>>0.28>>0.29. Likely not having ACS now but needs an ischemic eval for the cause of his cardiomyopathy. Echo does not comment on wall motion abnormalities. He is on Plavix and ASA for stent placed in 2011, will work on getting records.   4. Probable [facility] on chest CT[facility]. He is currently on B[facility], will get pulmonology to see.   5. DM: Per primary team  6. HTN:  Hypertensive, will add isosorbide, see discussion above.   Signed, EArbutus Leas NP 06/24/2016, 3:26 PM Pager: [phone]   Patient seen and examined. Agree with assessment and plan. Mr. LEsequiel Kleinfelteris a 66year old morbidly obese African-American male [facility]  Patient has a history of diabetes mellitus, hypertension, chronic kidney disease stage IV and by x-ray criteria is felt to have possible sarcoidosis although this has never been completely diagnosed.  There is a reported history of CAD and prior CVA.  Reportedly, in 2013 his LV function was fairly normal.  He states that he was recently treated with gout and had been on steroids.  He admits to almost an 80 pound waking over the past month.  [facility].  His pro-BNP was significantly elevated at 5700. Creatinine was 2.46.  A CT scan showed persistent mediastinal and hilar adenopathy, unchanged from 2013.  An echo Doppler study showed an ejection fraction at 15-20%, which was new.  He had been treated with BiPAP and had received Lasix without significant diuresis.  [facility]  He has mildly positive troponins with a plateau pattern highly suggestive of demand ischemia.  At present, his blood pressure is 175/82; pulse is 90.  He has a very thick neck.  Mallinpatti scale is at least a 3/4.  There are decreased breath sounds at his bases with faint  expiratory wheezing.  Rhythm is regular with a 1/6 systolic murmur with a rate in the 90s.  There is no chest wall tenderness.  He has moderate central adiposity.  There is 1-2+ edema.  Laboratory is notable for a BUN of 42 and creatinine of 2.85 which is slightly improved from creatinine of 3.05.  BNP is improved, but elevated at 355.  At present, plan continued IV diuresis and will increase furosemide to 80 mg every  12 hours, but he may need a higher dose.  Will change metoprolol to carvedilol.  With his renal dysfunction, he is not a candidate for ACE-I/ARB therapy or aldosterone blockade.  Will treat with nitrates last hydralazine.  At present, will continue with BiPAP therapy since he had desaturation on 7 L of O2 earlier this afternoon.  I suspect the patient may very well have obstructive/ central sleep apnea with his significant LV dysfunction and ultimately should undergo a sleep study.  At some point, he may need to undergo an ischemic evaluation  and depending upon his renal function may at least need a right heart cath and possible left heart cath.  Consider renal consultation.  We will follow the patient with you.    Troy Sine, MD, [facility] 06/24/2016 5:23 PM

## 2016-06-24 NOTE — Progress Notes (Signed)
Inpatient Diabetes Program Recommendations  AACE/ADA: New Consensus Statement on Inpatient Glycemic Control (2015)  Target Ranges:  Prepandial:   less than 140 mg/dL      Peak postprandial:   less than 180 mg/dL (1-2 hours)      Critically ill patients:  140 - 180 mg/dL   Lab Results  Component Value Date   GLUCAP 244 (H) 06/24/2016   HGBA1C 7.5 06/05/2016    Review of Glycemic Control:  Results for [name], [name] (MRN [id number]) as of 06/24/2016 12:40  Ref. Range 06/24/2016 08:01 06/24/2016 12:06  Glucose-Capillary Latest Ref Range: 65 - 99 mg/dL 180 (H) 244 (H)    Diabetes history: Type 2 diabetes Outpatient Diabetes medications: Lantus 90 units daily, Humalog 50 units with supper Current orders for Inpatient glycemic control:  Novolog sensitive tid with meals, Prednisone 40 mg daily Lantus 100 units daily Inpatient Diabetes Program Recommendations:    Please consider adding Novolog meal coverage 8 units tid with meals (hold if patient eats less than 50%).   Thanks, Adah Perl, RN, BC-ADM Inpatient Diabetes Coordinator Pager [phone] (8a-5p)

## 2016-06-24 NOTE — Progress Notes (Addendum)
PROGRESS NOTE                                                                                                                                                                                                             Patient Demographics:    Gabriel Alvarez, is a 66 y.o. male, DOB - 03-09-1951, IHD:[id number]  Admit date - 06/23/2016   Admitting Physician Waldemar Dickens, MD  Outpatient Primary MD for the patient is Manon Hilding, MD  LOS - 1  No chief complaint on file.      Brief Narrative  Patient  admitted earlier during the day by my colleague, chart, labs were reviewed.  Gabriel Alvarez is a 66 y.o. male with history COPD, ongoing tobacco abuse, diabetes mellitus type 2 and CAD status post stenting as per the patient (I don't have any records of this) presented to the ER at [facility] with complaints of shortness of breath on 06/21/2016  Subjective:    [name] today has, No headache, No chest pain, No abdominal pain - No Nausea, Report generalized weakness, denies any cough, reports dyspnea on exertion .    Assessment  & Plan :    Principal Problem:   Acute respiratory failure with hypoxia (HCC) Active Problems:   CAD (coronary artery disease) s/p Stent   Diabetes mellitus type 2 with peripheral artery disease (HCC)   Acute systolic CHF (congestive heart failure) (HCC)   Renal failure (ARF), acute on chronic (HCC)   Hypertensive urgency   Acute CHF (congestive heart failure) (HCC)   Acute respiratory failure with hypoxia  - probably multifactorial primarily acute systolic heart failure EF measured 2 days ago was 15-20% with grade 2 diastolic dysfunction, will continue with diuresis, cardiology consulted. -  CT scan also showing features concerning for sarcoidosis and patient is on prednisone at this time. Patient does have a history of COPD but on my exam is not actively wheezing will continue nebulizer. , check ACE  level.  Acute systolic CHF -Patient presents with anasarca, EF 15-20% with a grade 2 diastolic dysfunction, cardiology consulted, very likely patient will need CHF team consult  Acute on chronic kidney disease stage 3-4  - given the significant proteinuria and low albumin patient probably has nephrotic syndrome from diabetes. At this time patient is on Lasix. Holding off ACE  inhibitor due to worsening renal function.  - Most likely cardiorenal syndrome contributing to his renal failure, called his PCP office, most recent lab was in 2014 with a creatinine of 1.48.  Hypertensive urgency  - we will continue patient's hydralazine, metoprolol but hold off ACE inhibitor due to renal failure.  on when necessary IV hydralazine. Closely follow blood pressure trends.  Diabetes mellitus type 2  - last hemoglobin A1c 2 days ago was 7.4. Patient's blood sugar is elevated due to patient being on steroids. Closely follow CBGs with sliding scale coverage and continue home dose of Lantus. I have continued on the same insulin regimen at [facility].  CAD status post stenting  - patient and wife told me he had cardiac cath with stent placement in 2011  I don't see any records of this. Cycle cardiac markers continue aspirin and Plavix and statins. Patient is on beta blockers. - Patient with elevated troponins, continue to increase, this is most likely related to his CHF, [place] to see patient.   History of gout on allopurinol.  - May need to hold off allopurinol if there is worsening creatinine.   Code Status : Full  Family Communication  : wife at bedside  Disposition Plan  : pending further work up.  Consults  :  Cardiology  Procedures  : None  DVT Prophylaxis  :   Heparin - SCDs   Lab Results  Component Value Date   PLT 232 06/24/2016    Antibiotics  :   Anti-infectives    None        Objective:   Vitals:   06/24/16 0805 06/24/16 0900 06/24/16 1000 06/24/16 1100  BP:       Pulse:  71 86 92  Resp:  (!) 24 (!) 28 (!) 23  Temp: 98.2 F (36.8 C)     TempSrc: Oral     SpO2:  93% 90% 91%  Weight:      Height:        Wt Readings from Last 3 Encounters:  06/24/16 (!) 144.8 kg (319 lb 3.6 oz)  06/05/16 (!) 141.1 kg (311 lb)  06/05/16 (!) 141.1 kg (311 lb)     Intake/Output Summary (Last 24 hours) at 06/24/16 1148 Last data filed at 06/24/16 0900  Gross per 24 hour  Intake              481 ml  Output              650 ml  Net             -169 ml     Physical Exam  Awake Alert, Oriented X 3, No new F.N deficits, Normal affect Supple Neck,No JVD, Symmetrical Chest wall movement, Good air movement bilaterally,Bibasilar Rales RRR,No Gallops,Rubs or new Murmurs, No Parasternal Heave +ve B.Sounds, Abd Soft, No tenderness, , No rebound - guarding or rigidity. No Cyanosis, Clubbing , No new Rash or bruise  , significant anasarca    Data Review:    CBC  Recent Labs Lab 06/23/16 2326 06/24/16 0739  WBC 15.0* 16.2*  HGB 14.3 14.5  HCT 43.5 44.6  PLT 178 232  MCV 81.0 80.1  MCH 26.6 26.0  MCHC 32.9 32.5  RDW 17.8* 17.2*  LYMPHSABS 1.1  --   MONOABS 0.8  --   EOSABS 0.0  --   BASOSABS 0.0  --     Chemistries   Recent Labs Lab 06/23/16 2326 06/24/16 0739  NA 139  141  K 4.7 4.8  CL 105 110  CO2 20* 23  GLUCOSE 281* 176*  BUN 42* 42*  CREATININE 3.07* 2.85*  CALCIUM 8.0* 7.9*  MG  --  1.8  AST 32  --   ALT 26  --   ALKPHOS 72  --   BILITOT 0.4  --    ------------------------------------------------------------------------------------------------------------------ No results for input(s): CHOL, HDL, LDLCALC, TRIG, CHOLHDL, LDLDIRECT in the last 72 hours.  Lab Results  Component Value Date   HGBA1C 7.5 06/05/2016   ------------------------------------------------------------------------------------------------------------------  Recent Labs  06/24/16 0739  TSH 2.923    ------------------------------------------------------------------------------------------------------------------ No results for input(s): VITAMINB12, FOLATE, FERRITIN, TIBC, IRON, RETICCTPCT in the last 72 hours.  Coagulation profile No results for input(s): INR, PROTIME in the last 168 hours.  No results for input(s): DDIMER in the last 72 hours.  Cardiac Enzymes  Recent Labs Lab 06/23/16 2326 06/24/16 0739  TROPONINI 0.13* 0.28*   ------------------------------------------------------------------------------------------------------------------    Component Value Date/Time   BNP 355.0 (H) 06/23/2016 2326    Inpatient Medications  Scheduled Meds: . aspirin  325 mg Oral BID  . clopidogrel  75 mg Oral Daily  . furosemide  60 mg Intravenous Q12H  . heparin  5,000 Units Subcutaneous Q8H  . hydrALAZINE  50 mg Oral Q8H  . insulin aspart  0-9 Units Subcutaneous TID WC  . insulin glargine  100 Units Subcutaneous QHS  . metoprolol succinate  50 mg Oral Daily  . predniSONE  40 mg Oral Q breakfast  . simvastatin  20 mg Oral QHS   Continuous Infusions: PRN Meds:.acetaminophen **OR** acetaminophen, albuterol, hydrALAZINE, ondansetron **OR** ondansetron (ZOFRAN) IV  Micro Results Recent Results (from the past 240 hour(s))  MRSA PCR Screening     Status: None   Collection Time: 06/23/16  9:34 PM  Result Value Ref Range Status   MRSA by PCR NEGATIVE NEGATIVE Final    Comment:        The GeneXpert MRSA Assay (FDA approved for NASAL specimens only), is one component of a comprehensive MRSA colonization surveillance program. It is not intended to diagnose MRSA infection nor to guide or monitor treatment for MRSA infections.     Radiology Reports Dg Chest Port 1 View  Result Date: 06/24/2016 CLINICAL DATA:  Shortness of breath.  History of diabetes, CHF. EXAM: PORTABLE CHEST 1 VIEW COMPARISON:  CT chest June 22, 2016 FINDINGS: Cardiac silhouette is moderately enlarged,  unchanged. Mediastinal silhouette is nonsuspicious. Diffusely coarsened pulmonary interstitium. Increased lung volumes. No pleural effusion or focal consolidation. No pneumothorax. Soft tissue planes and included osseous structures are nonsuspicious. IMPRESSION: Stable cardiomegaly. Probable COPD, in addition to diffuse coarsened pulmonary interstitium better characterized on yesterday's CT chest. Electronically Signed   By: Elon Alas M.D.   On: 06/24/2016 02:19     Jaycob Mcclenton M.D on 06/24/2016 at 11:48 AM  Between 7am to 7pm - Pager - [phone]  After 7pm go to [email or web address] - password [facility]  [facility] -  Office  [phone]

## 2016-06-24 NOTE — Progress Notes (Signed)
Pt. Troponin Level elevated at .13

## 2016-06-24 NOTE — H&P (Signed)
History and Physical    Gabriel Alvarez Gabriel Alvarez DOB: Dec 23, 1950 DOA: 06/23/2016  PCP: Manon Hilding, MD  Patient coming from: Patient was transferred from [facility].  Chief Complaint: Shortness of breath.  HPI: Gabriel Alvarez is a 66 y.o. male with history COPD, ongoing tobacco abuse, diabetes mellitus type 2 and CAD status post stenting as per the patient (I don't have any records of this) presented to the ER at [facility] with complaints of shortness of breath on 06/21/2016. Patient's shortness of breath increased on exertion and at rest. Denies any chest pain. At that time chest x-ray done showed features concerning for pulmonary edema. Patient was placed on Lasix and also since there was concern for COPD exacerbation patient was also placed on nebulizer treatment with Solu-Medrol.   CT chest was done which was showing persistent mediastinal and hilar adenopathy unchanged from 2013. Interstitial prominence and patchy groundglass opacities overall findings favoring sarcoidosis could possibly be related to pulmonary edema and small right pleural effusion noted.   Patient's 2-D echo results done on 06/22/2016 showed EF of 15-20% with grade 2 diastolic dysfunction. Mildly reduced right ventricular function.  Patient was initially given Lasix 20 mg and after 2-D echo results were available patient's Lasix dose was increased to 60 mg twice a day. Patient's discharge creatinine was around 2.4. Presently patient's creatinine is around 3.  On my exam patient is not in respiratory distress. Patient is still requiring 7 liters oxygen to maintain sats more than 90%. Patient is being admitted for acute respiratory failure likely from CHF and possible sarcoidosis.  ED Course: Patient was a direct admit.  Review of Systems: As per HPI, rest all negative.   Past Medical History:  Diagnosis Date  . CEREBROVASCULAR ACCIDENT, ACUTE 09/05/2008  . CORONARY ARTERY DISEASE 12/30/2006  .  DIABETES MELLITUS, TYPE II 12/30/2006  . DM neuropathy, type II diabetes mellitus ([place])   . ED (erectile dysfunction)   . GOUT 07/30/2008  . HYPERCHOLESTEROLEMIA 07/30/2008  . HYPERTENSION 12/30/2006    Past Surgical History:  Procedure Laterality Date  . hand sursgery  02/2102   right hand, 8 stitches     reports that he has been smoking Cigarettes and Cigars.  He has a 30.00 pack-year smoking history. He has never used smokeless tobacco. He reports that he does not drink alcohol or use drugs.  No Known Allergies  Family History  Problem Relation Age of Onset  . Diabetes Father     Prior to Admission medications   Medication Sig Start Date End Date Taking? Authorizing Provider  allopurinol (ZYLOPRIM) 100 MG tablet  12/16/15   Historical Provider, MD  aspirin 325 MG tablet Take 325 mg by mouth 2 (two) times daily.      Historical Provider, MD  B-D ULTRAFINE III SHORT PEN 31G X 8 MM MISC USE 1 PEN NEEDLE DAILY 04/24/15   Renato Shin, MD  BD INSULIN SYRINGE ULTRAFINE 31G X 5/16" 1 ML MISC USE 2 SYRINGES PER DAY 01/25/12   Renato Shin, MD  BD ULTRA-FINE PEN NEEDLES 29G X 12.7MM MISC USE 1 PER DAY 05/07/14   Renato Shin, MD  clopidogrel (PLAVIX) 75 MG tablet Take 75 mg by mouth daily.      Historical Provider, MD  COLCRYS 0.6 MG tablet  02/28/12   Historical Provider, MD  enalapril (VASOTEC) 20 MG tablet Take 20 mg by mouth daily.      Historical Provider, MD  glucose blood (ONETOUCH VERIO) test  strip USE TO CHECK BLOOD SUGAR TWICE A DAY 01/30/16   Renato Shin, MD  hydrochlorothiazide 25 MG tablet 1 tablet by mouth once daily    Historical Provider, MD  insulin glargine (LANTUS) 100 UNIT/ML injection Inject 0.9 mLs (90 Units total) into the skin every morning. 06/16/16   Renato Shin, MD  insulin lispro (HUMALOG KWIKPEN) 100 UNIT/ML KiwkPen Inject 0.6 mLs (60 Units total) into the skin daily with supper. 06/16/16   Renato Shin, MD  metoprolol (TOPROL-XL) 50 MG 24 hr tablet Take 50 mg by mouth  daily.      Historical Provider, MD  simvastatin (ZOCOR) 20 MG tablet Take 20 mg by mouth at bedtime.      Historical Provider, MD  traZODone (DESYREL) 50 MG tablet Take 50 mg by mouth at bedtime. Reported on 07/18/2015    Historical Provider, MD    Physical Exam: Vitals:   06/23/16 2130 06/23/16 2143 06/23/16 2325  BP:  (!) 192/84   Pulse:  99   Resp:  (!) 31   Temp:  98.8 F (37.1 C) 98.7 F (37.1 C)  TempSrc:  Oral Oral  SpO2:  (!) 87%   Weight: (!) 144.8 kg (319 lb 3.6 oz)    Height: _0  (1.803 m)        Constitutional: Obese not in distress. Vitals:   06/23/16 2130 06/23/16 2143 06/23/16 2325  BP:  (!) 192/84   Pulse:  99   Resp:  (!) 31   Temp:  98.8 F (37.1 C) 98.7 F (37.1 C)  TempSrc:  Oral Oral  SpO2:  (!) 87%   Weight: (!) 144.8 kg (319 lb 3.6 oz)    Height: _1  (1.803 m)     Eyes: Anicteric no pallor. ENMT: No discharge from the ears eyes nose or mouth. Neck: No mass felt. No JVD appreciated. Respiratory: No rhonchi or crepitations. Cardiovascular: S1-S2 heard. No murmurs appreciated. Abdomen: Soft nontender bowel sounds present. No guarding or rigidity. Musculoskeletal: Mild edema of both lower extremity. Skin: No rash. Skin appears warm. Neurologic: Alert awake oriented to time place and person. Moves all extremities. Psychiatric: Appears normal. Normal affect.   Labs on Admission: I have personally reviewed following labs and imaging studies  CBC:  Recent Labs Lab 06/23/16 2326  WBC 15.0*  NEUTROABS 13.1*  HGB 14.3  HCT 43.5  MCV 81.0  PLT 034   Basic Metabolic Panel:  Recent Labs Lab 06/23/16 2326  NA 139  K 4.7  CL 105  CO2 20*  GLUCOSE 281*  BUN 42*  CREATININE 3.07*  CALCIUM 8.0*   GFR: Estimated Creatinine Clearance: 35 mL/min (by C-G formula based on SCr of 3.07 mg/dL (H)). Liver Function Tests:  Recent Labs Lab 06/23/16 2326  AST 32  ALT 26  ALKPHOS 72  BILITOT 0.4  PROT 5.0*  ALBUMIN 1.9*   No  results for input(s): LIPASE, AMYLASE in the last 168 hours. No results for input(s): AMMONIA in the last 168 hours. Coagulation Profile: No results for input(s): INR, PROTIME in the last 168 hours. Cardiac Enzymes:  Recent Labs Lab 06/23/16 2326  TROPONINI 0.13*   BNP (last 3 results) No results for input(s): PROBNP in the last 8760 hours. HbA1C: No results for input(s): HGBA1C in the last 72 hours. CBG: No results for input(s): GLUCAP in the last 168 hours. Lipid Profile: No results for input(s): CHOL, HDL, LDLCALC, TRIG, CHOLHDL, LDLDIRECT in the last 72 hours. Thyroid Function Tests: No results for  input(s): TSH, T4TOTAL, FREET4, T3FREE, THYROIDAB in the last 72 hours. Anemia Panel: No results for input(s): VITAMINB12, FOLATE, FERRITIN, TIBC, IRON, RETICCTPCT in the last 72 hours. Urine analysis: No results found for: COLORURINE, APPEARANCEUR, LABSPEC, PHURINE, GLUCOSEU, HGBUR, BILIRUBINUR, KETONESUR, PROTEINUR, UROBILINOGEN, NITRITE, LEUKOCYTESUR Sepsis Labs: _0 (procalcitonin:4,lacticidven:4) )No results found for this or any previous visit (from the past 240 hour(s)).   Radiological Exams on Admission: No results found.   Assessment/Plan Principal Problem:   Acute respiratory failure with hypoxia (HCC) Active Problems:   CAD (coronary artery disease) s/p Stent   Diabetes mellitus type 2 with peripheral artery disease (HCC)   Acute systolic CHF (congestive heart failure) (HCC)   Renal failure (ARF), acute on chronic (HCC)   Hypertensive urgency   Acute CHF (congestive heart failure) ([place])    1. Acute respiratory failure with hypoxia - probably multifactorial primarily acute systolic heart failure EF measured 2 days ago was 15-20% with grade 2 diastolic dysfunction. CT scan also showing features concerning for sarcoidosis and patient is on prednisone at this time. Patient does have a history of COPD but on my exam is not actively wheezing will continue  nebulizer. Follow systolic heart failure I'm continuing Lasix 60 mg IV every 12 but holding off ACE inhibitor due to worsening renal function. Closely follow renal function daily intake output and daily weights. Cycle cardiac markers: History of CAD. May need cardiology consult in a.m. due to low EF. EKG is pending.ABGs pending. 2. 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 At this time patient is on Lasix. Holding off ACE inhibitor due to worsening renal function. May discuss with nephrology. Check FENa. 3. Hypertensive urgency - we will continue patient's hydralazine, metoprolol but hold off ACE inhibitor due to renal failure. I have placed patient on when necessary IV hydralazine. Closely follow blood pressure trends. 4. Diabetes mellitus type 2 - last hemoglobin A1c 2 days ago was 7.4. Patient's blood sugar is elevated due to patient being on steroids. Closely follow CBGs with sliding scale coverage and continue home dose of Lantus. I have continued on the same insulin regimen at [facility]. 5. CAD status post stenting - patient states he had stents placed 8 years ago at [facility]. I don't see any records of this. Cycle cardiac markers continue aspirin and Plavix and statins. Patient is on beta blockers. 6. History of gout on allopurinol. May need to hold off allopurinol if there is worsening creatinine.   DVT prophylaxis: Heparin. Code Status: Full code.  Family Communication: Discussed with patient.  Disposition Plan: Home.  Consults called: None.  Admission status: Inpatient.    Rise Patience MD [facility] Pager [phone].  If 7PM-7AM, please contact night-coverage [email or web address] Password [email or web address]  06/24/2016, 12:55 AM

## 2016-06-25 DIAGNOSIS — I5041 Acute combined systolic (congestive) and diastolic (congestive) heart failure: Secondary | ICD-10-CM

## 2016-06-25 DIAGNOSIS — N170 Acute kidney failure with tubular necrosis: Secondary | ICD-10-CM

## 2016-06-25 DIAGNOSIS — J9622 Acute and chronic respiratory failure with hypercapnia: Secondary | ICD-10-CM

## 2016-06-25 DIAGNOSIS — I5021 Acute systolic (congestive) heart failure: Secondary | ICD-10-CM

## 2016-06-25 LAB — BASIC METABOLIC PANEL
ANION GAP: 8 (ref 5–15)
BUN: 49 mg/dL — ABNORMAL HIGH (ref 6–20)
CHLORIDE: 109 mmol/L (ref 101–111)
CO2: 24 mmol/L (ref 22–32)
Calcium: 7.9 mg/dL — ABNORMAL LOW (ref 8.9–10.3)
Creatinine, Ser: 2.96 mg/dL — ABNORMAL HIGH (ref 0.61–1.24)
GFR calc non Af Amer: 21 mL/min — ABNORMAL LOW (ref >60)
GFR, EST AFRICAN AMERICAN: 24 mL/min — AB (ref >60)
Glucose, Bld: 132 mg/dL — ABNORMAL HIGH (ref 65–99)
Potassium: 4.4 mmol/L (ref 3.5–5.1)
SODIUM: 141 mmol/L (ref 135–145)

## 2016-06-25 LAB — GLUCOSE, CAPILLARY
GLUCOSE-CAPILLARY: 132 mg/dL — AB (ref 65–99)
GLUCOSE-CAPILLARY: 400 mg/dL — AB (ref 65–99)
Glucose-Capillary: 51 mg/dL — ABNORMAL LOW (ref 65–99)
Glucose-Capillary: 66 mg/dL (ref 65–99)
Glucose-Capillary: 336 mg/dL — ABNORMAL HIGH (ref 65–99)

## 2016-06-25 LAB — CBC
HCT: 43.8 % (ref 39.0–52.0)
HEMOGLOBIN: 14.1 g/dL (ref 13.0–17.0)
MCH: 26 pg (ref 26.0–34.0)
MCHC: 32.2 g/dL (ref 30.0–36.0)
MCV: 80.7 fL (ref 78.0–100.0)
Platelets: 241 10*3/uL (ref 150–400)
RBC: 5.43 MIL/uL (ref 4.22–5.81)
RDW: 17.3 % — ABNORMAL HIGH (ref 11.5–15.5)
WBC: 13.1 10*3/uL — AB (ref 4.0–10.5)

## 2016-06-25 LAB — C-REACTIVE PROTEIN

## 2016-06-25 LAB — ANGIOTENSIN CONVERTING ENZYME: Angiotensin-Converting Enzyme: <15 U/L (ref 14–82)

## 2016-06-25 LAB — PROCALCITONIN: PROCALCITONIN: 0.19 ng/mL

## 2016-06-25 LAB — SEDIMENTATION RATE: SED RATE: 17 mm/h — AB (ref 0–16)

## 2016-06-25 MED ORDER — ISOSORBIDE MONONITRATE ER 30 MG PO TB24
30.0000 mg | ORAL_TABLET | Freq: Every day | ORAL | Status: DC
  Administered 2016-06-25: 30 mg via ORAL
  Filled 2016-06-25: qty 1

## 2016-06-25 MED ORDER — METOLAZONE 2.5 MG PO TABS
2.5000 mg | ORAL_TABLET | Freq: Once | ORAL | Status: AC
  Administered 2016-06-25: 2.5 mg via ORAL
  Filled 2016-06-25: qty 1

## 2016-06-25 MED ORDER — BUDESONIDE 0.5 MG/2ML IN SUSP
0.5000 mg | Freq: Two times a day (BID) | RESPIRATORY_TRACT | Status: DC
  Administered 2016-06-25 – 2016-07-03 (×16): 0.5 mg via RESPIRATORY_TRACT
  Filled 2016-06-25 (×16): qty 2

## 2016-06-25 MED ORDER — DEXTROSE 50 % IV SOLN
INTRAVENOUS | Status: AC
  Administered 2016-06-25: 25 mL
  Filled 2016-06-25: qty 50

## 2016-06-25 MED ORDER — IPRATROPIUM-ALBUTEROL 0.5-2.5 (3) MG/3ML IN SOLN
3.0000 mL | Freq: Four times a day (QID) | RESPIRATORY_TRACT | Status: DC
  Administered 2016-06-25 – 2016-06-28 (×12): 3 mL via RESPIRATORY_TRACT
  Filled 2016-06-25 (×11): qty 3

## 2016-06-25 MED ORDER — ASPIRIN 81 MG PO CHEW
81.0000 mg | CHEWABLE_TABLET | Freq: Every day | ORAL | Status: DC
  Administered 2016-06-25 – 2016-07-03 (×9): 81 mg via ORAL
  Filled 2016-06-25 (×9): qty 1

## 2016-06-25 MED ORDER — INSULIN GLARGINE 100 UNIT/ML ~~LOC~~ SOLN
85.0000 [IU] | Freq: Every day | SUBCUTANEOUS | Status: DC
  Administered 2016-06-25 – 2016-06-26 (×2): 85 [IU] via SUBCUTANEOUS
  Filled 2016-06-25 (×2): qty 0.85

## 2016-06-25 NOTE — Progress Notes (Signed)
MD wants pt trial off bipap.  Pt placed on NRB, no distress noted.  PT denies SOB.  RN at bedside and aware.

## 2016-06-25 NOTE — Consult Note (Signed)
PULMONARY / CRITICAL CARE MEDICINE   Name: Gabriel Alvarez MRN: [id number] DOB: [birth year]    ADMISSION DATE:  06/23/2016 CONSULTATION DATE:  93/4  REFERRING MD:  Elgergawy ([facility])   CHIEF COMPLAINT:  Respiratory failure  HISTORY OF PRESENT ILLNESS:   66yo male smoker with hx DM, CAD, chronic combined CHF, CKD IV, HTN initially presented to [facility] hospital with SOB, weight gain of 80lbs in last 30 days.  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  Echo showed EF 15% and he was tx to [facility] 1/2 for cardiology eval.  CT chest showed ground glass opacities with some question for sarcoid v edema.  He has continued to require bipap despite diuresis and PCCM consulted.   Currently c/o mild dyspnea although improved, mostly with exertion.  Also c/o orthopnea, weight gain, BLE edema.  Denies PND, chest pain, hemoptysis.  DOES have family hx of sarcoid -- his daughter died last year at age 3 "of sarcoid".  No other autoimmune family hx.  Current smoker - abt 1/2ppd.  40 pack years.    PAST MEDICAL HISTORY :  He  has a past medical history of CEREBROVASCULAR ACCIDENT, ACUTE (09/05/2008); CORONARY ARTERY DISEASE (12/30/2006); DIABETES MELLITUS, TYPE II (12/30/2006); DM neuropathy, type II diabetes mellitus ([place]); ED (erectile dysfunction); GOUT (07/30/2008); HYPERCHOLESTEROLEMIA (07/30/2008); and HYPERTENSION (12/30/2006).  PAST SURGICAL HISTORY: He  has a past surgical history that includes hand sursgery (02/2102).  No Known Allergies  No current facility-administered medications on file prior to encounter.    Current Outpatient Prescriptions on File Prior to Encounter  Medication Sig  . allopurinol (ZYLOPRIM) 300 MG tablet Take 300 mg by mouth daily.   Marland Kitchen aspirin 325 MG tablet Take 325 mg by mouth daily.   . clopidogrel (PLAVIX) 75 MG tablet Take 75 mg by mouth daily.    . enalapril (VASOTEC) 20 MG tablet Take 20 mg by mouth daily.    . hydrochlorothiazide 25 MG tablet Take 25 mg by  mouth daily.   . insulin glargine (LANTUS) 100 UNIT/ML injection Inject 0.9 mLs (90 Units total) into the skin every morning.  . insulin lispro (HUMALOG KWIKPEN) 100 UNIT/ML KiwkPen Inject 0.6 mLs (60 Units total) into the skin daily with supper. (Patient taking differently: Inject 50 Units into the skin daily with supper. )  . metoprolol succinate (TOPROL-XL) 100 MG 24 hr tablet Take 100 mg by mouth daily.   . simvastatin (ZOCOR) 20 MG tablet Take 20 mg by mouth at bedtime.    . traZODone (DESYREL) 100 MG tablet Take 100 mg by mouth at bedtime. Reported on 07/18/2015  . B-D ULTRAFINE III SHORT PEN 31G X 8 MM MISC USE 1 PEN NEEDLE DAILY (Patient not taking: Reported on 06/24/2016)  . BD INSULIN SYRINGE ULTRAFINE 31G X 5/16" 1 ML MISC USE 2 SYRINGES PER DAY (Patient not taking: Reported on 06/24/2016)  . BD ULTRA-FINE PEN NEEDLES 29G X 12.7MM MISC USE 1 PER DAY (Patient not taking: Reported on 06/24/2016)  . glucose blood (ONETOUCH VERIO) test strip USE TO CHECK BLOOD SUGAR TWICE A DAY (Patient not taking: Reported on 06/24/2016)    FAMILY HISTORY:  His indicated that the status of his father is unknown.    SOCIAL HISTORY: He  reports that he has been smoking Cigarettes and Cigars.  He has a 30.00 pack-year smoking history. He has never used smokeless tobacco. He reports that he does not drink alcohol or use drugs.  REVIEW OF SYSTEMS:  As per HPI - All other systems reviewed and were neg.   SUBJECTIVE:    VITAL SIGNS: BP 132/61 (BP Location: Right Wrist)   Pulse 77   Temp 98.6 F (37 C) (Axillary)   Resp (!) 24   Ht _0  (1.803 m)   Wt (!) 168.7 kg (372 lb) Comment: patient weighed in [facility] Bed because has difficulty standing   SpO2 93%   BMI 51.88 kg/m   HEMODYNAMICS:    VENTILATOR SETTINGS: FiO2 (%):  [50 %-70 %] 70 %  INTAKE / OUTPUT: I/O last 3 completed shifts: In: 154 [P.O.:960; Other:1] Out: 0086 [PYPPJ:0932]  PHYSICAL EXAMINATION: General:  Pleasant, obese male, NAD  sitting OOB  Neuro:  Awake, alert, appropriate, MAE  HEENT:  Mm moist Cardiovascular:  s1s2 distant  Lungs:  resps even non labored on NRB, diminished, scattered crackles  Abdomen:  Round, distended, non tender, +bs  Musculoskeletal:  Warm and dry, 2+ BLE edema  LABS:  BMET  Recent Labs Lab 06/23/16 2326 06/24/16 0739 06/25/16 0316  NA 139 141 141  K 4.7 4.8 4.4  CL 105 110 109  CO2 20* 23 24  BUN 42* 42* 49*  CREATININE 3.07* 2.85* 2.96*  GLUCOSE 281* 176* 132*    Electrolytes  Recent Labs Lab 06/23/16 2326 06/24/16 0739 06/25/16 0316  CALCIUM 8.0* 7.9* 7.9*  MG  --  1.8  --     CBC  Recent Labs Lab 06/23/16 2326 06/24/16 0739 06/25/16 0316  WBC 15.0* 16.2* 13.1*  HGB 14.3 14.5 14.1  HCT 43.5 44.6 43.8  PLT 178 232 241    Coag's No results for input(s): APTT, INR in the last 168 hours.  Sepsis Markers No results for input(s): LATICACIDVEN, PROCALCITON, O2SATVEN in the last 168 hours.  ABG  Recent Labs Lab 06/24/16 0130  PHART 7.351  PCO2ART 47.4  PO2ART 72.1*    Liver Enzymes  Recent Labs Lab 06/23/16 2326  AST 32  ALT 26  ALKPHOS 72  BILITOT 0.4  ALBUMIN 1.9*    Cardiac Enzymes  Recent Labs Lab 06/23/16 2326 06/24/16 0739 06/24/16 1149  TROPONINI 0.13* 0.28* 0.29*    Glucose  Recent Labs Lab 06/24/16 0801 06/24/16 1206 06/24/16 1554 06/24/16 2204 06/25/16 0753 06/25/16 0923  GLUCAP 180* 244* 384* 220* 51* 66    Imaging No results found.   STUDIES:  Echo ([place]) >>>Echocardiogram was done on 06/22/2016 and showed moderate concentric left ventricular hypertrophy with an LVEF of 15-20%. Also with grade 2 diastolic dysfunction. He had normal biatrial size.Last echo in 2013 with normal LV function.  CT chest ([place])>>> persistent mediastinal and hilar adenopathy unchanged from 2013. Interstitial prominence and patchy groundglass opacities overall findings favoring sarcoidosis could possibly be related to  pulmonary edema and small right pleural effusion noted.   CULTURES:   ANTIBIOTICS:   SIGNIFICANT EVENTS:   LINES/TUBES:   DISCUSSION: 66yo male with hx CKD, CAD now with acute on chronic CHF with EG 15% and acute hypoxic respiratory failure requiring bipap.   ASSESSMENT / PLAN:   Acute hypoxic respiratory failure  Tobacco abuse  Presumed COPD Suspectred OSA  Abnormal chest CT -- does have strong family hx for sarcoid but hard to interpret current CT given significant volume overload.  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   I agree with the resident/APP's  history, physical examination, assessment, and plans.    I have edited the above note and modified it according to our agreed history, physical examination, assessment and plan.   Briefly, 66yo male smoker with hx DM, CAD, chronic combined CHF, CKD IV, HTN initially presented to [facility] hospital with SOB, weight gain of 80lbs in last 30 days.  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  Echo showed EF 15% and he was tx to [facility] 1/2 for cardiology eval.  CT chest showed  ground glass opacities at the bases as well as increased interstitial markings in upper and lower lung zones.   ABG on 1/3 with 7.35/47/72 on 6L. He was placed on BiPAP at 70% Fio2 and was comfortable with o2 sats > 88%.  He was switched to nonrebreather mask today to facilitate eating. He denies any history of sleep apnea.  PE as above. Since seen, comfortable albeit in mild distress. On nonrebreather mask with O2 saturation in the mid 90s. Blood pressure 114/73, heart rate 70, respiratory 24. Morbidly obese. Crowded airway. Neck veins not prominent. Breath sounds were clear. Good S1 and S2. Abdomen was benign. Obese. Grade 2 edema.  Labs reviewed.   Assessment and Plan : 1. 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 Patient currently looks comfortable on nonrebreather mask. We'll try to switch him to high flow nasal cannula to facilitate eating. Try  to keep O2 saturation more than 88%, try to cut down FiO2. On the ABG on 1/3, his PO2 was 72 on 6 L. I imagine, we can start cutting down on his FiO2. Higher FiO2 may drive hypercapnia. Start pulmicort neb BID, cont duoneb.  He is currently on prednisone 40 mg daily >> can try to switch to IV if not better in 1-2 days.  Check for immunologic work up to be thorough. BiPaP at HS for (most likely) OSA. Watch out for worsening respiratory status. He may end up being intubated. I extensively discussed with the patient his current condition. If he ends up being intubated, most likely he will need a tracheostomy and he is okay with that. Cont diuresis per cardiology.  He is too unstable to get a diagnostic bronchoscopy at this point.  2. AKI. Observe creatinine with diuresis  3. No signs of infection for now. Low threshold for starting antibiotics if he has clinical deterioration.  We'll check pro calcitonin. Sputum culture if  possible.   I have spent 30  minutes of critical care time with this patient today.  Family :  No family at bedside.  Monica Becton, MD 06/25/2016, 3:53 PM [facility] Pager [phone] After 3 pm or if no answer, call [phone]

## 2016-06-25 NOTE — Progress Notes (Addendum)
Patient ID: Gabriel Alvarez, male   DOB: 23-Aug-1950, 66 y.o.   MRN: [id number]   SUBJECTIVE: Patient diuresed well yesterday.  Weight not accurate.  Sats drop off Bipap into the 80s, 90s back on Bipap. He denies dyspnea at rest.   Scheduled Meds: . aspirin  325 mg Oral BID  . carvedilol  6.25 mg Oral BID WC  . clopidogrel  75 mg Oral Daily  . furosemide  80 mg Intravenous Q12H  . heparin  5,000 Units Subcutaneous Q8H  . hydrALAZINE  50 mg Oral Q8H  . insulin aspart  0-9 Units Subcutaneous TID WC  . insulin glargine  100 Units Subcutaneous QHS  . isosorbide mononitrate  30 mg Oral Daily  . metolazone  2.5 mg Oral Once  . predniSONE  40 mg Oral Q breakfast  . simvastatin  20 mg Oral QHS   Continuous Infusions: PRN Meds:.acetaminophen **OR** acetaminophen, albuterol, hydrALAZINE, ondansetron **OR** ondansetron (ZOFRAN) IV    Vitals:   06/25/16 0300 06/25/16 0348 06/25/16 0500 06/25/16 0751  BP: (!) 113/52   (!) 108/52  Pulse:  69  65  Resp:  (!) 24  (!) 24  Temp: 98.6 F (37 C)   98.6 F (37 C)  TempSrc: Axillary   Axillary  SpO2: 90% 94%  93%  Weight:   (!) 372 lb (168.7 kg)   Height:        Intake/Output Summary (Last 24 hours) at 06/25/16 0809 Last data filed at 06/25/16 0544  Gross per 24 hour  Intake              721 ml  Output             3075 ml  Net            -2354 ml    LABS: Basic Metabolic Panel:  Recent Labs  06/24/16 0739 06/25/16 0316  NA 141 141  K 4.8 4.4  CL 110 109  CO2 23 24  GLUCOSE 176* 132*  BUN 42* 49*  CREATININE 2.85* 2.96*  CALCIUM 7.9* 7.9*  MG 1.8  --    Liver Function Tests:  Recent Labs  06/23/16 2326  AST 32  ALT 26  ALKPHOS 72  BILITOT 0.4  PROT 5.0*  ALBUMIN 1.9*   No results for input(s): LIPASE, AMYLASE in the last 72 hours. CBC:  Recent Labs  06/23/16 2326 06/24/16 0739 06/25/16 0316  WBC 15.0* 16.2* 13.1*  NEUTROABS 13.1*  --   --   HGB 14.3 14.5 14.1  HCT 43.5 44.6 43.8  MCV 81.0 80.1 80.7  PLT 178  232 241   Cardiac Enzymes:  Recent Labs  06/23/16 2326 06/24/16 0739 06/24/16 1149  TROPONINI 0.13* 0.28* 0.29*   BNP: Invalid input(s): POCBNP D-Dimer: No results for input(s): DDIMER in the last 72 hours. Hemoglobin A1C: No results for input(s): HGBA1C in the last 72 hours. Fasting Lipid Panel: No results for input(s): CHOL, HDL, LDLCALC, TRIG, CHOLHDL, LDLDIRECT in the last 72 hours. Thyroid Function Tests:  Recent Labs  06/24/16 0739  TSH 2.923   Anemia Panel: No results for input(s): VITAMINB12, FOLATE, FERRITIN, TIBC, IRON, RETICCTPCT in the last 72 hours.  RADIOLOGY: Dg Chest Port 1 View  Result Date: 06/24/2016 CLINICAL DATA:  Shortness of breath.  History of diabetes, CHF. EXAM: PORTABLE CHEST 1 VIEW COMPARISON:  CT chest June 22, 2016 FINDINGS: Cardiac silhouette is moderately enlarged, unchanged. Mediastinal silhouette is nonsuspicious. Diffusely coarsened pulmonary interstitium. Increased lung volumes.  No pleural effusion or focal consolidation. No pneumothorax. Soft tissue planes and included osseous structures are nonsuspicious. IMPRESSION: Stable cardiomegaly. Probable COPD, in addition to diffuse coarsened pulmonary interstitium better characterized on yesterday's CT chest. Electronically Signed   By: Elon Alas M.D.   On: 06/24/2016 02:19    PHYSICAL EXAM General: NAD Neck: Thick, JVP 14-16 cm, no thyromegaly or thyroid nodule.  Lungs: Distant breath sounds.  CV: Nondisplaced PMI.  Heart regular S1/S2, no S3/S4, no murmur.  1+ edema to knees, 1+ edema in forearms.   Abdomen: Soft, nontender, no hepatosplenomegaly, no distention.  Neurologic: Alert and oriented x 3.  Psych: Normal affect. Extremities: No clubbing or cyanosis.   TELEMETRY: Reviewed telemetry pt in NSR  ASSESSMENT AND PLAN: 66 yo with history of CKD stage III, DM, HTN, CAD presented to [facility] with acute CHF.  Echo was done showing EF 15-20%, he was hypoxic requiring Bipap.   Creatinine elevated, ?chronicity as no recent prior BMET.  Transferred to [facility] for management.  1. Acute systolic CHF: Uncertain chronicity of low EF.  Echo (1/18) at [facility] with EF 15-20%, moderate LVH.  Last echo in 2013 had normal EF.  History of CAD, so possible ischemic cardiomyopathy.  Also may have sarcoid based on CT chest at [facility].  Currently, he is volume overloaded on exam but management complicated by elevated creatinine.  BP is stable.  He remains hypoxic.  - Remains on Bipap, hypoxic on nasal cannula.  - Continue diuresis with Lasix 80 mg IV bid + metolazone 2.5 mg po x 1 today.  - Continue hydralazine/Imdur, would not titrate with SBP around 110.  - Continue low dose Coreg.  - No ACEI/ARB/ARNI/spironolactone with elevated creatinine.  - Will need eventual ischemic workup but creatinine currently prohibitive in absence of ACS.  - Will need RHC, ?tomorrow versus after more diuresis.  2. CAD: History of remote mid RCA stent.  Last cath in 2005 with 50% in-stent restenosis in RCA.  Mild stable troponin elevation likely demand ischemia/volume overload.  - Continue ASA (can decrease to 81 daily), Plavix, statin.  - Will need eventual ischemic workup but creatinine currently prohibitive in absence of ACS.  3. ?AKI on CKD stage III: Most recent prior creatinine was 1.48 in 2014, so not sure of chronicity of elevated creatinine (acute versus chronic).  Will have to watch closely with diuresis.  Lowering of renal venous pressure with diuresis may help renal function.  4. Abnormal chest CT: Concern for sarcoidosis.  He has been started on prednisone.  Would have pulmonary evaluate.  5. DM II: Per primary service.  6. Gout: Stable.  7. OSA: Suspected but not diagnosed.   Gabriel Alvarez 06/25/2016 8:18 AM

## 2016-06-25 NOTE — Progress Notes (Signed)
PROGRESS NOTE                                                                                                                                                                                                             Patient Demographics:    Gabriel Alvarez, is a 66 y.o. male, DOB - 1950-12-14, XQJ:[id number]  Admit date - 06/23/2016   Admitting Physician Waldemar Dickens, MD  Outpatient Primary MD for the patient is Manon Hilding, MD  LOS - 2  No chief complaint on file.      Brief Narrative  Gabriel Alvarez is a 66 y.o. male with history COPD, ongoing tobacco abuse, diabetes mellitus type 2 and CAD status post stenting as per the patient (I don't have any records of this) presented to the ER at [facility] with complaints of shortness of breath on 06/21/2016, Echo showed EF 15% and he was tx to [facility] 1/2 for cardiology eval.  CT chest showed ground glass opacities with some question for sarcoid v edema, Transferred to [facility] 1/3 for further workup.  Subjective:    Gabriel Alvarez today has, No headache, No chest pain, No abdominal pain - No Nausea, Report generalized weakness, denies any cough, reports dyspnea on exertion .    Assessment  & Plan :    Principal Problem:   Acute respiratory failure with hypoxia (HCC) Active Problems:   Coronary artery disease involving native coronary artery of native heart without angina pectoris   Diabetes mellitus type 2 with peripheral artery disease (HCC)   Acute systolic CHF (congestive heart failure) (HCC)   Acute renal failure superimposed on stage 4 chronic kidney disease (HCC)   Hypertensive urgency   Acute CHF (congestive heart failure) (HCC)   SOB (shortness of breath)   Cardiomyopathy (HCC)   Acute respiratory failure with hypoxia  - probably multifactorial primarily acute systolic heart failure EF measured 2 days ago was 15-20% with grade 2 diastolic dysfunction, will continue with diuresis,  cardiology consulted. -  CT scan also showing features concerning for sarcoidosis and patient is on prednisone at this time. Patient does have a history of COPD  as well . Pulmonary consulted to further evaluate possible sarcoidosis as suggested per CT chest??. - Did require BiPAP overnight, remains with the profound hypoxia, requiring nonrebreather when off BiPAP.  Acute systolic / diastolic CHF -Patient presents with anasarca, EF 15-20% with a grade 2 diastolic dysfunction. - Management per cardiology , currently on IV Lasix , and metolazone when necessary , monitor ins and outs closely so far -2.7 L since admission . - Continue with low-dose Coreg, hydralazine/Imdur, - No ACEI/ARB/ARNI/spironolactone with elevated creatinine.  Acute on chronic kidney disease stage 3-4  - given the significant proteinuria and low albumin patient probably has nephrotic syndrome from diabetes. At this time patient is on Lasix. Holding off ACE inhibitor due to worsening renal function.  - Most likely cardiorenal syndrome contributing to his renal failure, called his PCP office, most recent lab was in 2014 with a creatinine of 1.48.  Hypertension - With elevated blood pressure on admission, currently on Coreg, hydralazine, Imdur  Diabetes mellitus type 2  - last hemoglobin A1c 2 days before admission was 7.4. Patient's blood sugar is elevated due to patient being on steroids.  - Seemingly labile, usually uncontrolled but hypoglycemia this a.m.(most likely to tolerate breakfast as he was on BiPAP), will decrease Lantus from 100-85 units at bedtime, continue with insulin sliding scale  CAD - Continue with aspirin, Plavix and statin - Elevated troponins non-ACS pattern per cardiology.  History of gout on allopurinol.  - May need to hold off allopurinol if there is worsening creatinine.   Code Status : Full  Family Communication  : wife at bedside  Disposition Plan  : pending further work  up.  Consults  :  Cardiology  Procedures  : None  DVT Prophylaxis  :   Heparin - SCDs   Lab Results  Component Value Date   PLT 241 06/25/2016    Antibiotics  :   Anti-infectives    None        Objective:   Vitals:   06/25/16 0851 06/25/16 0855 06/25/16 0905 06/25/16 1136  BP: 132/61 132/61  114/73  Pulse: 74 73 77 70  Resp:  (!) 22 (!) 24 (!) 24  Temp:    97.4 F (36.3 C)  TempSrc:    Oral  SpO2:  90% 93% 90%  Weight:      Height:        Wt Readings from Last 3 Encounters:  06/25/16 (!) 168.7 kg (372 lb)  06/05/16 (!) 141.1 kg (311 lb)  06/05/16 (!) 141.1 kg (311 lb)     Intake/Output Summary (Last 24 hours) at 06/25/16 1207 Last data filed at 06/25/16 0916  Gross per 24 hour  Intake              480 ml  Output             3200 ml  Net            -2720 ml     Physical Exam  Awake Alert, Oriented X 3, No new F.N deficits, Normal affect Supple Neck,No JVD, Symmetrical Chest wall movement, Good air movement bilaterally,Bibasilar Rales RRR,No Gallops,Rubs or new Murmurs, No Parasternal Heave +ve B.Sounds, Abd Soft, No tenderness, , No rebound - guarding or rigidity. No Cyanosis, Clubbing , No new Rash or bruise  , significant anasarca    Data Review:    CBC  Recent Labs Lab 06/23/16 2326 06/24/16 0739 06/25/16 0316  WBC 15.0* 16.2* 13.1*  HGB 14.3 14.5 14.1  HCT 43.5 44.6 43.8  PLT 178 232 241  MCV 81.0 80.1 80.7  MCH 26.6 26.0 26.0  MCHC 32.9 32.5 32.2  RDW 17.8* 17.2* 17.3*  LYMPHSABS 1.1  --   --   MONOABS 0.8  --   --   EOSABS 0.0  --   --   BASOSABS 0.0  --   --     Chemistries   Recent Labs Lab 06/23/16 2326 06/24/16 0739 06/25/16 0316  NA 139 141 141  K 4.7 4.8 4.4  CL 105 110 109  CO2 20* 23 24  GLUCOSE 281* 176* 132*  BUN 42* 42* 49*  CREATININE 3.07* 2.85* 2.96*  CALCIUM 8.0* 7.9* 7.9*  MG  --  1.8  --   AST 32  --   --   ALT 26  --   --   ALKPHOS 72  --   --   BILITOT 0.4  --   --     ------------------------------------------------------------------------------------------------------------------ No results for input(s): CHOL, HDL, LDLCALC, TRIG, CHOLHDL, LDLDIRECT in the last 72 hours.  Lab Results  Component Value Date   HGBA1C 7.5 06/05/2016   ------------------------------------------------------------------------------------------------------------------  Recent Labs  06/24/16 0739  TSH 2.923   ------------------------------------------------------------------------------------------------------------------ No results for input(s): VITAMINB12, FOLATE, FERRITIN, TIBC, IRON, RETICCTPCT in the last 72 hours.  Coagulation profile No results for input(s): INR, PROTIME in the last 168 hours.  No results for input(s): DDIMER in the last 72 hours.  Cardiac Enzymes  Recent Labs Lab 06/23/16 2326 06/24/16 0739 06/24/16 1149  TROPONINI 0.13* 0.28* 0.29*   ------------------------------------------------------------------------------------------------------------------    Component Value Date/Time   BNP 355.0 (H) 06/23/2016 2326    Inpatient Medications  Scheduled Meds: . aspirin  81 mg Oral Daily  . carvedilol  6.25 mg Oral BID WC  . clopidogrel  75 mg Oral Daily  . furosemide  80 mg Intravenous Q12H  . heparin  5,000 Units Subcutaneous Q8H  . hydrALAZINE  50 mg Oral Q8H  . insulin aspart  0-9 Units Subcutaneous TID WC  . insulin glargine  100 Units Subcutaneous QHS  . ipratropium-albuterol  3 mL Nebulization Q6H  . isosorbide mononitrate  30 mg Oral Daily  . metolazone  2.5 mg Oral Once  . predniSONE  40 mg Oral Q breakfast  . simvastatin  20 mg Oral QHS   Continuous Infusions: PRN Meds:.acetaminophen **OR** acetaminophen, albuterol, hydrALAZINE, ondansetron **OR** ondansetron (ZOFRAN) IV  Micro Results Recent Results (from the past 240 hour(s))  MRSA PCR Screening     Status: None   Collection Time: 06/23/16  9:34 PM  Result Value Ref  Range Status   MRSA by PCR NEGATIVE NEGATIVE Final    Comment:        The GeneXpert MRSA Assay (FDA approved for NASAL specimens only), is one component of a comprehensive MRSA colonization surveillance program. It is not intended to diagnose MRSA infection nor to guide or monitor treatment for MRSA infections.     Radiology Reports Dg Chest Port 1 View  Result Date: 06/24/2016 CLINICAL DATA:  Shortness of breath.  History of diabetes, CHF. EXAM: PORTABLE CHEST 1 VIEW COMPARISON:  CT chest June 22, 2016 FINDINGS: Cardiac silhouette is moderately enlarged, unchanged. Mediastinal silhouette is nonsuspicious. Diffusely coarsened pulmonary interstitium. Increased lung volumes. No pleural effusion or focal consolidation. No pneumothorax. Soft tissue planes and included osseous structures are nonsuspicious. IMPRESSION: Stable cardiomegaly. Probable COPD, in addition to diffuse coarsened pulmonary interstitium better characterized on yesterday's CT chest. Electronically Signed   By: Elon Alas M.D.   On: 06/24/2016 02:19     Dmitriy Gair M.D on 06/25/2016 at 12:07 PM  Between 7am to 7pm -  Pager - [phone]  After 7pm go to [email or web address] - password [facility]  [facility] -  Office  [phone]

## 2016-06-25 NOTE — Progress Notes (Addendum)
Pt placed on HFNC per MD order, 30 lpm HFNC.  Sat now 92%.  Pt appears to be tol well currently.  No distress noted.    Also, given sputum cup for collection and pt given instructions on sputum collection.

## 2016-06-26 ENCOUNTER — Encounter (HOSPITAL_COMMUNITY)
Admission: AD | Disposition: A | Discharge status: Home Health | Payer: Self-pay | Source: Other Acute Inpatient Hospital | Attending: Internal Medicine

## 2016-06-26 ENCOUNTER — Encounter (HOSPITAL_COMMUNITY): Payer: Self-pay | Admitting: Cardiology

## 2016-06-26 ENCOUNTER — Inpatient Hospital Stay (HOSPITAL_COMMUNITY): Payer: Managed Care, Other (non HMO)

## 2016-06-26 DIAGNOSIS — N183 Chronic kidney disease, stage 3 (moderate): Secondary | ICD-10-CM

## 2016-06-26 DIAGNOSIS — I509 Heart failure, unspecified: Secondary | ICD-10-CM

## 2016-06-26 HISTORY — PX: CARDIAC CATHETERIZATION: SHX172

## 2016-06-26 LAB — BASIC METABOLIC PANEL
Anion gap: 10 (ref 5–15)
BUN: 49 mg/dL — ABNORMAL HIGH (ref 6–20)
CALCIUM: 7.9 mg/dL — AB (ref 8.9–10.3)
CO2: 25 mmol/L (ref 22–32)
CREATININE: 2.91 mg/dL — AB (ref 0.61–1.24)
Chloride: 104 mmol/L (ref 101–111)
GFR calc non Af Amer: 21 mL/min — ABNORMAL LOW (ref >60)
GFR, EST AFRICAN AMERICAN: 25 mL/min — AB (ref >60)
Glucose, Bld: 199 mg/dL — ABNORMAL HIGH (ref 65–99)
Potassium: 4.3 mmol/L (ref 3.5–5.1)
SODIUM: 139 mmol/L (ref 135–145)

## 2016-06-26 LAB — CBC
HCT: 44.1 % (ref 39.0–52.0)
Hemoglobin: 14.1 g/dL (ref 13.0–17.0)
MCH: 26.1 pg (ref 26.0–34.0)
MCHC: 32 g/dL (ref 30.0–36.0)
MCV: 81.5 fL (ref 78.0–100.0)
Platelets: 198 10*3/uL (ref 150–400)
RBC: 5.41 MIL/uL (ref 4.22–5.81)
RDW: 17.2 % — ABNORMAL HIGH (ref 11.5–15.5)
WBC: 11.4 10*3/uL — ABNORMAL HIGH (ref 4.0–10.5)

## 2016-06-26 LAB — POCT I-STAT 3, VENOUS BLOOD GAS (G3P V)
ACID-BASE EXCESS: 1 mmol/L (ref 0.0–2.0)
ACID-BASE EXCESS: 1 mmol/L (ref 0.0–2.0)
Bicarbonate: 27.5 mmol/L (ref 20.0–28.0)
Bicarbonate: 28.3 mmol/L — ABNORMAL HIGH (ref 20.0–28.0)
O2 SAT: 65 %
O2 SAT: 65 %
PCO2 VEN: 52.2 mmHg (ref 44.0–60.0)
PH VEN: 7.328 (ref 7.250–7.430)
PO2 VEN: 37 mmHg (ref 32.0–45.0)
TCO2: 29 mmol/L (ref 0–100)
TCO2: 30 mmol/L (ref 0–100)
pCO2, Ven: 53.9 mmHg (ref 44.0–60.0)
pH, Ven: 7.329 (ref 7.250–7.430)
pO2, Ven: 37 mmHg (ref 32.0–45.0)

## 2016-06-26 LAB — MPO/PR-3 (ANCA) ANTIBODIES
ANCA Proteinase 3: <3.5 U/mL (ref 0.0–3.5)
Myeloperoxidase Abs: <9 U/mL (ref 0.0–9.0)

## 2016-06-26 LAB — GLUCOSE, CAPILLARY
GLUCOSE-CAPILLARY: 314 mg/dL — AB (ref 65–99)
Glucose-Capillary: 207 mg/dL — ABNORMAL HIGH (ref 65–99)
Glucose-Capillary: 142 mg/dL — ABNORMAL HIGH (ref 65–99)
Glucose-Capillary: 80 mg/dL (ref 65–99)
Glucose-Capillary: 399 mg/dL — ABNORMAL HIGH (ref 65–99)

## 2016-06-26 LAB — PROCALCITONIN: Procalcitonin: 0.16 ng/mL

## 2016-06-26 LAB — ANTINUCLEAR ANTIBODIES, IFA: ANA Ab, IFA: NEGATIVE

## 2016-06-26 LAB — RHEUMATOID FACTOR: Rhuematoid fact SerPl-aCnc: <10 IU/mL (ref 0.0–13.9)

## 2016-06-26 LAB — ANTI-SCLERODERMA ANTIBODY: Scleroderma (Scl-70) (ENA) Antibody, IgG: <0.2 AI (ref 0.0–0.9)

## 2016-06-26 LAB — ANGIOTENSIN CONVERTING ENZYME: Angiotensin-Converting Enzyme: 21 U/L (ref 14–82)

## 2016-06-26 LAB — ANTI-JO 1 ANTIBODY, IGG: Anti JO-1: <0.2 AI (ref 0.0–0.9)

## 2016-06-26 SURGERY — RIGHT HEART CATH
Anesthesia: LOCAL

## 2016-06-26 MED ORDER — SODIUM CHLORIDE 0.9% FLUSH
3.0000 mL | Freq: Two times a day (BID) | INTRAVENOUS | Status: DC
Start: 2016-06-26 — End: 2016-06-28
  Administered 2016-06-26 – 2016-06-27 (×2): 3 mL via INTRAVENOUS

## 2016-06-26 MED ORDER — HEPARIN SODIUM (PORCINE) 5000 UNIT/ML IJ SOLN: 5000.0000 [IU] | Freq: Three times a day (TID) | INTRAMUSCULAR | Status: DC

## 2016-06-26 MED ORDER — SODIUM CHLORIDE 0.9 % IV SOLN: 250.0000 mL | INTRAVENOUS | Status: DC | PRN

## 2016-06-26 MED ORDER — ONDANSETRON HCL 4 MG/2ML IJ SOLN: 4.0000 mg | Freq: Four times a day (QID) | INTRAMUSCULAR | Status: DC | PRN

## 2016-06-26 MED ORDER — SODIUM CHLORIDE 0.9% FLUSH: 3.0000 mL | INTRAVENOUS | Status: DC | PRN

## 2016-06-26 MED ORDER — LIDOCAINE HCL (PF) 1 % IJ SOLN
INTRAMUSCULAR | Status: AC
  Filled 2016-06-26: qty 30

## 2016-06-26 MED ORDER — SODIUM CHLORIDE 0.9% FLUSH
3.0000 mL | Freq: Two times a day (BID) | INTRAVENOUS | Status: DC
  Administered 2016-06-26 – 2016-06-27 (×3): 3 mL via INTRAVENOUS

## 2016-06-26 MED ORDER — SODIUM CHLORIDE 0.9% FLUSH: 3.0000 mL | Freq: Two times a day (BID) | INTRAVENOUS | Status: DC

## 2016-06-26 MED ORDER — ISOSORBIDE MONONITRATE ER 60 MG PO TB24
60.0000 mg | ORAL_TABLET | Freq: Every day | ORAL | Status: DC
  Administered 2016-06-26 – 2016-06-28 (×3): 60 mg via ORAL
  Filled 2016-06-26 (×3): qty 1

## 2016-06-26 MED ORDER — SODIUM CHLORIDE 0.9 % IV SOLN: INTRAVENOUS | Status: DC

## 2016-06-26 MED ORDER — HEPARIN (PORCINE) IN NACL 2-0.9 UNIT/ML-% IJ SOLN
INTRAMUSCULAR | Status: AC
  Filled 2016-06-26: qty 500

## 2016-06-26 MED ORDER — HEPARIN (PORCINE) IN NACL 2-0.9 UNIT/ML-% IJ SOLN
INTRAMUSCULAR | Status: DC | PRN
  Administered 2016-06-26: 1000 mL

## 2016-06-26 MED ORDER — INSULIN ASPART 100 UNIT/ML ~~LOC~~ SOLN
8.0000 [IU] | Freq: Three times a day (TID) | SUBCUTANEOUS | Status: DC
Start: 2016-06-26 — End: 2016-06-30
  Administered 2016-06-26 – 2016-06-29 (×10): 8 [IU] via SUBCUTANEOUS

## 2016-06-26 MED ORDER — LIDOCAINE HCL (PF) 1 % IJ SOLN
INTRAMUSCULAR | Status: DC | PRN
  Administered 2016-06-26: 1 mL via INTRADERMAL

## 2016-06-26 MED ORDER — METOLAZONE 2.5 MG PO TABS
2.5000 mg | ORAL_TABLET | Freq: Once | ORAL | Status: AC
  Administered 2016-06-26: 2.5 mg via ORAL
  Filled 2016-06-26: qty 1

## 2016-06-26 MED ORDER — ACETAMINOPHEN 325 MG PO TABS
650.0000 mg | ORAL_TABLET | ORAL | Status: DC | PRN
  Administered 2016-06-28 – 2016-06-30 (×2): 650 mg via ORAL
  Filled 2016-06-26: qty 2

## 2016-06-26 SURGICAL SUPPLY — 6 items
CATH BALLN WEDGE 5F 110CM (CATHETERS) ×2 IMPLANT
PACK CARDIAC CATHETERIZATION (CUSTOM PROCEDURE TRAY) ×2 IMPLANT
SHEATH FAST CATH BRACH 5F 5CM (SHEATH) ×2 IMPLANT
TRANSDUCER W/STOPCOCK (MISCELLANEOUS) ×2 IMPLANT
TUBING ART PRESS 72  MALE/FEM (TUBING) ×1
TUBING ART PRESS 72 MALE/FEM (TUBING) ×1 IMPLANT

## 2016-06-26 NOTE — Interval H&P Note (Signed)
History and Physical Interval Note:  06/26/2016 11:00 AM  Gabriel Alvarez  has presented today for surgery, with the diagnosis of chf  The various methods of treatment have been discussed with the patient and family. After consideration of risks, benefits and other options for treatment, the patient has consented to  Procedure(s): Right Heart Cath (N/A) as a surgical intervention .  The patient's history has been reviewed, patient examined, no change in status, stable for surgery.  I have reviewed the patient's chart and labs.  Questions were answered to the patient's satisfaction.     Gabriel Alvarez [employer]

## 2016-06-26 NOTE — Progress Notes (Signed)
PULMONARY / CRITICAL CARE MEDICINE   Name: Gabriel Alvarez MRN: [id number] DOB: 1951/03/28    ADMISSION DATE:  06/23/2016 CONSULTATION DATE:  5/4  REFERRING MD:  Elgergawy ([facility])   CHIEF COMPLAINT:  Respiratory failure  HISTORY OF PRESENT ILLNESS:   66yo male smoker with hx DM, CAD, chronic combined CHF, CKD IV, HTN initially presented to [facility] hospital with SOB, weight gain of 80lbs in last 30 days.  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  Echo showed EF 15% and he was tx to [facility] 1/2 for cardiology eval.  CT chest showed ground glass opacities with some question for sarcoid v edema.  He has continued to require bipap despite diuresis and PCCM consulted.   Currently c/o mild dyspnea although improved, mostly with exertion.  Also c/o orthopnea, weight gain, BLE edema.  Denies PND, chest pain, hemoptysis.  DOES have family hx of sarcoid -- his daughter died last year at age 9 "of sarcoid".  No other autoimmune family hx.  Current smoker - abt 1/2ppd.  40 pack years.    SUBJECTIVE:  Tolerating HFNC. Dyspnea improved.  VITAL SIGNS: BP (!) 152/67   Pulse 90   Temp 98.2 F (36.8 C) (Oral)   Resp (!) 22   Ht _0  (1.803 m)   Wt (!) 307 lb (139.3 kg)   SpO2 (!) 87%   BMI 42.82 kg/m   INTAKE / OUTPUT: I/O last 3 completed shifts: In: 1440 [P.O.:1440] Out: 7564 [Urine:5325]  PHYSICAL EXAMINATION: General:  Pleasant, obese male, NAD Neuro:  Awake, alert, appropriate, MAE  HEENT:  Mm moist Cardiovascular:  s1s2 distant  Lungs:  resps even nonlabored, diminished, scattered crackles  Abdomen:  Round, distended, non tender, +bs  Musculoskeletal:  Warm and dry, 2+ BLE edema  LABS:  BMET  Recent Labs Lab 06/24/16 0739 06/25/16 0316 06/26/16 0325  NA 141 141 139  K 4.8 4.4 4.3  CL 110 109 104  CO2 _1 BUN 42* 49* 49*  CREATININE 2.85* 2.96* 2.91*  GLUCOSE 176* 132* 199*    Electrolytes  Recent Labs Lab 06/24/16 0739 06/25/16 0316  06/26/16 0325  CALCIUM 7.9* 7.9* 7.9*  MG 1.8  --   --     CBC  Recent Labs Lab 06/24/16 0739 06/25/16 0316 06/26/16 0325  WBC 16.2* 13.1* 11.4*  HGB 14.5 14.1 14.1  HCT 44.6 43.8 44.1  PLT 232 241 198    Sepsis Markers  Recent Labs Lab 06/25/16 1614 06/26/16 0325  PROCALCITON 0.19 0.16    ABG  Recent Labs Lab 06/24/16 0130  PHART 7.351  PCO2ART 47.4  PO2ART 72.1*    Liver Enzymes  Recent Labs Lab 06/23/16 2326  AST 32  ALT 26  ALKPHOS 72  BILITOT 0.4  ALBUMIN 1.9*    Cardiac Enzymes  Recent Labs Lab 06/23/16 2326 06/24/16 0739 06/24/16 1149  TROPONINI 0.13* 0.28* 0.29*    Glucose  Recent Labs Lab 06/25/16 1138 06/25/16 1628 06/25/16 2218 06/26/16 0334 06/26/16 0754 06/26/16 1209  GLUCAP 132* 400* 336* 207* 142* 80    Imaging Dg Chest Portable 1 View  Result Date: 06/26/2016 CLINICAL DATA:  Respiratory failure. EXAM: PORTABLE CHEST 1 VIEW COMPARISON:  06/23/2016.  CT 01/01/ 2018. FINDINGS: Persistent mediastinal fullness noted consistent with previously identified adenopathy. Cardiomegaly with diffuse bilateral from interstitial prominence and bilateral pleural effusions consistent congestive heart failure. Progressive bibasilar atelectasis. No pneumothorax . IMPRESSION: 1. Persistent mediastinal fullness consistent previously identified  adenopathy. 2. Cardiomegaly with diffuse bilateral pulmonary interstitial prominence and small bilateral pleural effusions consistent with congestive heart failure. 3.  Progressive bibasilar atelectasis . Electronically Signed   By: Marcello Moores  Register   On: 06/26/2016 06:49     STUDIES:  Echo ([place]) >>>Echocardiogram was done on 06/22/2016 and showed moderate concentric left ventricular hypertrophy with an LVEF of 15-20%. Also with grade 2 diastolic dysfunction. He had normal biatrial size.Last echo in 2013 with normal LV function.  CT chest ([place])>>> persistent mediastinal and hilar adenopathy  unchanged from 2013. Interstitial prominence and patchy groundglass opacities overall findings favoring sarcoidosis could possibly be related to pulmonary edema and small right pleural effusion noted.  RHC 1/5 >>> mildly elevated right and left heart filling pressures, severe PAH  CULTURES: Sputum culture  ANTIBIOTICS: None  SIGNIFICANT EVENTS: 1/3> transferred to [facility]  LINES/TUBES: None  DISCUSSION: 66yo male with hx CKD, CAD now with acute on chronic CHF with EG 15% and acute hypoxic respiratory failure requiring bipap.   ASSESSMENT / PLAN: Acute hypoxic respiratory failure  Tobacco abuse  Presumed COPD Suspected OSA/OHS Abnormal chest CT -- does have strong family hx for sarcoid but hard to interpret current CT given significant volume overload.  -Continue bipap PRN and qhs  -Wean O2 for sats >88%  -Diuresis per cards -Once volume status improved and acute systolic CHF issues resolved, would recheck CT chest and further w/u ?sarcoid -Continue prednisone  -ESR unremarkable, RFnegative, CRP negative, ACE negative -scheduled BD's   Hx CAD  Acute systolic CHF - EF 03-88%  ?ischemic cardiomyopathy  Severe PAH -Continue aggressive diuresis as renal function tolerates per cardiology  -Continue coreg, hydralazine, imdur  -ASA, plavix, statin   AKI on CKD  P:   Monitor closely with diuresis  F/u chem  No ACE/ARB   Jacques Earthly, MD  Internal Medicine PGY-3 06/26/2016  3:40 PM Pager: [phone] or [phone]

## 2016-06-26 NOTE — Progress Notes (Signed)
Patient ID: Gabriel Alvarez, male   DOB: 04/19/1951, 66 y.o.   MRN: [id number]   SUBJECTIVE: Patient diuresed well yesterday.  Weight not accurate (bed weight). Sats now in low 90s on nasal cannula.  Renal function stably elevated.   Scheduled Meds: . aspirin  81 mg Oral Daily  . budesonide (PULMICORT) nebulizer solution  0.5 mg Nebulization BID  . carvedilol  6.25 mg Oral BID WC  . clopidogrel  75 mg Oral Daily  . furosemide  80 mg Intravenous Q12H  . heparin  5,000 Units Subcutaneous Q8H  . hydrALAZINE  50 mg Oral Q8H  . insulin aspart  0-9 Units Subcutaneous TID WC  . insulin glargine  85 Units Subcutaneous QHS  . ipratropium-albuterol  3 mL Nebulization Q6H  . isosorbide mononitrate  30 mg Oral Daily  . predniSONE  40 mg Oral Q breakfast  . simvastatin  20 mg Oral QHS   Continuous Infusions: PRN Meds:.acetaminophen **OR** acetaminophen, albuterol, hydrALAZINE, ondansetron **OR** ondansetron (ZOFRAN) IV    Vitals:   06/25/16 2101 06/25/16 2300 06/26/16 0300 06/26/16 0756  BP: (!) 132/99 (!) 146/75 (!) 160/92 (!) 170/66  Pulse:  73 84 84  Resp:  (!) 37 (!) 26 17  Temp:  98.5 F (36.9 C) 98 F (36.7 C) 98.2 F (36.8 C)  TempSrc:  Oral Oral Oral  SpO2:  92% 93% 90%  Weight:   (!) 372 lb (168.7 kg)   Height:        Intake/Output Summary (Last 24 hours) at 06/26/16 0821 Last data filed at 06/26/16 0559  Gross per 24 hour  Intake             1440 ml  Output             3975 ml  Net            -2535 ml    LABS: Basic Metabolic Panel:  Recent Labs  06/24/16 0739 06/25/16 0316 06/26/16 0325  NA 141 141 139  K 4.8 4.4 4.3  CL 110 109 104  CO2 23 24 25  GLUCOSE 176* 132* 199*  BUN 42* 49* 49*  CREATININE 2.85* 2.96* 2.91*  CALCIUM 7.9* 7.9* 7.9*  MG 1.8  --   --    Liver Function Tests:  Recent Labs  06/23/16 2326  AST 32  ALT 26  ALKPHOS 72  BILITOT 0.4  PROT 5.0*  ALBUMIN 1.9*   No results for input(s): LIPASE, AMYLASE in the last 72  hours. CBC:  Recent Labs  06/23/16 2326  06/25/16 0316 06/26/16 0325  WBC 15.0*  < > 13.1* 11.4*  NEUTROABS 13.1*  --   --   --   HGB 14.3  < > 14.1 14.1  HCT 43.5  < > 43.8 44.1  MCV 81.0  < > 80.7 81.5  PLT 178  < > 241 198  < > = values in this interval not displayed. Cardiac Enzymes:  Recent Labs  06/23/16 2326 06/24/16 0739 06/24/16 1149  TROPONINI 0.13* 0.28* 0.29*   BNP: Invalid input(s): POCBNP D-Dimer: No results for input(s): DDIMER in the last 72 hours. Hemoglobin A1C: No results for input(s): HGBA1C in the last 72 hours. Fasting Lipid Panel: No results for input(s): CHOL, HDL, LDLCALC, TRIG, CHOLHDL, LDLDIRECT in the last 72 hours. Thyroid Function Tests:  Recent Labs  06/24/16 0739  TSH 2.923   Anemia Panel: No results for input(s): VITAMINB12, FOLATE, FERRITIN, TIBC, IRON, RETICCTPCT in the last 72   hours.  RADIOLOGY: Dg Chest Portable 1 View  Result Date: 06/26/2016 CLINICAL DATA:  Respiratory failure. EXAM: PORTABLE CHEST 1 VIEW COMPARISON:  06/23/2016.  CT 01/01/ 2018. FINDINGS: Persistent mediastinal fullness noted consistent with previously identified adenopathy. Cardiomegaly with diffuse bilateral from interstitial prominence and bilateral pleural effusions consistent congestive heart failure. Progressive bibasilar atelectasis. No pneumothorax . IMPRESSION: 1. Persistent mediastinal fullness consistent previously identified adenopathy. 2. Cardiomegaly with diffuse bilateral pulmonary interstitial prominence and small bilateral pleural effusions consistent with congestive heart failure. 3.  Progressive bibasilar atelectasis . Electronically Signed   By: Thomas  Register   On: 06/26/2016 06:49   Dg Chest Port 1 View  Result Date: 06/24/2016 CLINICAL DATA:  Shortness of breath.  History of diabetes, CHF. EXAM: PORTABLE CHEST 1 VIEW COMPARISON:  CT chest June 22, 2016 FINDINGS: Cardiac silhouette is moderately enlarged, unchanged. Mediastinal silhouette  is nonsuspicious. Diffusely coarsened pulmonary interstitium. Increased lung volumes. No pleural effusion or focal consolidation. No pneumothorax. Soft tissue planes and included osseous structures are nonsuspicious. IMPRESSION: Stable cardiomegaly. Probable COPD, in addition to diffuse coarsened pulmonary interstitium better characterized on yesterday's CT chest. Electronically Signed   By: Courtnay  Bloomer M.D.   On: 06/24/2016 02:19    PHYSICAL EXAM General: NAD, obese.  Neck: Thick, JVP difficult but suspect elevated, no thyromegaly or thyroid nodule.  Lungs: Distant breath sounds.  CV: Nondisplaced PMI.  Heart regular S1/S2, no S3/S4, no murmur.  1+ edema to knees.   Abdomen: Soft, nontender, no hepatosplenomegaly, no distention.  Neurologic: Alert and oriented x 3.  Psych: Normal affect. Extremities: No clubbing or cyanosis.   TELEMETRY: Reviewed telemetry pt in NSR  ASSESSMENT AND PLAN: 66 yo with history of CKD stage III, DM, HTN, CAD presented to [facility] with acute CHF.  Echo was done showing EF 15-20%, he was hypoxic requiring Bipap.  Creatinine elevated, ?chronicity as no recent prior BMET.  Transferred to [facility] for management.  1. Acute systolic CHF: Uncertain chronicity of low EF.  Echo (1/18) at [facility] with EF 15-20%, moderate LVH.  Last echo in 2013 had normal EF.  History of CAD, so possible ischemic cardiomyopathy.  Also may have sarcoid based on CT chest at [facility].  Currently, I suspect that he is volume overloaded (though exam difficult) but management complicated by elevated creatinine (stable today).  BP is stable.  He remains on oxygen by nasal cannula.  - Continue diuresis with Lasix 80 mg IV bid + metolazone 2.5 mg po x 1 again today.  - Continue hydralazine 50 mg tid, increase Imdur to 60 daily.  - Continue low dose Coreg.  - No ACEI/ARB/ARNI/spironolactone with elevated creatinine.  - Will need eventual ischemic workup but creatinine currently prohibitive in  absence of ACS.  - RHC today to assess filling pressures (difficult exam).  We discussed risks/benefits and he agrees to proceed. Attempted to contact wife but no answer, nurse will attempt also.   2. CAD: History of remote mid RCA stent.  Last cath in 2005 with 50% in-stent restenosis in RCA.  Mild stable troponin elevation likely demand ischemia/volume overload.  - Continue ASA (can decrease to 81 daily), Plavix, statin.  - Will need eventual ischemic workup but creatinine currently prohibitive in absence of ACS.  3. ?AKI on CKD stage III: Most recent prior creatinine was 1.48 in 2014, so not sure of chronicity of elevated creatinine (acute versus chronic).  Will have to watch closely with diuresis.  Lowering of renal venous pressure with diuresis may   help renal function. Creatinine stable today at 2.9.  4. Abnormal chest CT: Concern for sarcoidosis.  He has been started on prednisone.  Pulmonary following.  ACE level is not elevated.  5. DM II: Per primary service.  6. Gout: Stable.  7. OSA/OHS: Suspected.  He will use Bipap at night and oxygen during the day.   Makayli Bracken 06/26/2016 8:21 AM   

## 2016-06-26 NOTE — Progress Notes (Signed)
PROGRESS NOTE                                                                                                                                                                                                             Patient Demographics:    Gabriel Alvarez, is a 66 y.o. male, DOB - 04/29/1951, WPV:[id number]  Admit date - 06/23/2016   Admitting Physician Waldemar Dickens, MD  Outpatient Primary MD for the patient is Manon Hilding, MD  LOS - 3  No chief complaint on file.      Brief Narrative  Gabriel Alvarez is a 66 y.o. male with history COPD, ongoing tobacco abuse, diabetes mellitus type 2 and CAD status post stenting as per the patient (I don't have any records of this) presented to the ER at [facility] with complaints of shortness of breath on 06/21/2016, Echo showed EF 15% and he was tx to [facility] 1/2 for cardiology eval.  CT chest showed ground glass opacities with some question for sarcoid v edema, Transferred to [facility] 1/3 for further workup.  Subjective:    Gabriel Alvarez today has, No headache, No chest pain, No abdominal pain - No Nausea, Report generalized weakness, denies any cough,Denies any dyspnea at rest(he is currently on 30 L-on nasal cannula!)    Assessment  & Plan :    Principal Problem:   Acute respiratory failure with hypoxia (HCC) Active Problems:   Coronary artery disease involving native coronary artery of native heart without angina pectoris   Diabetes mellitus type 2 with peripheral artery disease (HCC)   Acute systolic CHF (congestive heart failure) (HCC)   Acute renal failure superimposed on stage 4 chronic kidney disease (HCC)   Hypertensive urgency   Acute CHF (congestive heart failure) (HCC)   SOB (shortness of breath)   Cardiomyopathy (HCC)   Acute respiratory failure with hypoxia  - multifactorial primarily acute systolic/Diastolic heart failure ZS82-70% with grade 2 diastolic dysfunction, seen by cardiology,  on diuresis. - As well secondary to sarcoidosis(most likely), seen by pulmonary, on steroids - Very likely OSA plus OHS contributing - Initially on BiPAP, currently on high flow nasal cannula.   Acute systolic / diastolic CHF -Patient presents with anasarca, EF 15-20% with a grade 2 diastolic dysfunction. - Management per cardiology , currently on IV Lasix , and metolazone  when necessary , monitor ins and outs closely so far -5.2 L since admission . - Continue with low-dose Coreg, hydralazine/Imdur, - No ACEI/ARB/ARNI/spironolactone with elevated creatinine. - RHC today to assess filling pressures  Possible sarcoidosis - Pulmonary consulted, on prednisone, and Pulmicort and nebs  Acute on chronic kidney disease stage 3-4  - given the significant proteinuria and low albumin patient probably has nephrotic syndrome from diabetes. At this time patient is on Lasix. Holding off ACE inhibitor due to worsening renal function.  - Most likely cardiorenal syndrome contributing to his renal failure, called his PCP office, most recent lab was in 2014 with a creatinine of 1.48.  Hypertension - With elevated blood pressure on admission, currently on Coreg, hydralazine, Imdur  Diabetes mellitus type 2  - last hemoglobin A1c 2 days before admission was 7.4. Patient's blood sugar is elevated due to patient being on steroids.  - Seemingly labile, with hypoglycemia yesterday, CBG of 80 today ,usually uncontrolled but hypoglycemia, Lantus has been decreased from  100-85 units at bedtime, continue with insulin sliding scale, will add 8 units NovoLog before meals  CAD - Continue with aspirin, Plavix and statin - Elevated troponins non-ACS pattern per cardiology.  History of gout on allopurinol.  -  on allopurinol   Code Status : Full  Family Communication  : wife at bedside 06/25/2016  Disposition Plan  : pending further work up.  Consults  :  Cardiology  Procedures  : None  DVT Prophylaxis  :    Heparin - SCDs   Lab Results  Component Value Date   PLT 198 06/26/2016    Antibiotics  :   Anti-infectives    None        Objective:   Vitals:   06/26/16 1121 06/26/16 1126 06/26/16 1131 06/26/16 1136  BP: (!) 154/90 (!) 162/91  (!) 169/95  Pulse: 72 79 77 77  Resp: _0 Temp:      TempSrc:      SpO2: (!) 89% (!) 88% (!) 88% (!) 87%  Weight:      Height:        Wt Readings from Last 3 Encounters:  06/26/16 (!) 139.3 kg (307 lb)  06/05/16 (!) 141.1 kg (311 lb)  06/05/16 (!) 141.1 kg (311 lb)     Intake/Output Summary (Last 24 hours) at 06/26/16 1226 Last data filed at 06/26/16 4862  Gross per 24 hour  Intake             1200 ml  Output             4000 ml  Net            -2800 ml     Physical Exam  Awake Alert, Oriented X 3, No new F.N deficits, Normal affect Supple Neck,No JVD, Symmetrical Chest wall movement, Good air movement bilaterally,Bibasilar Rales RRR,No Gallops,Rubs or new Murmurs, No Parasternal Heave +ve B.Sounds, Abd Soft, No tenderness, , No rebound - guarding or rigidity. No Cyanosis, Clubbing , No new Rash or bruise  , significant anasarca    Data Review:    CBC  Recent Labs Lab 06/23/16 2326 06/24/16 0739 06/25/16 0316 06/26/16 0325  WBC 15.0* 16.2* 13.1* 11.4*  HGB 14.3 14.5 14.1 14.1  HCT 43.5 44.6 43.8 44.1  PLT 178 232 241 198  MCV 81.0 80.1 80.7 81.5  MCH 26.6 26.0 26.0 26.1  MCHC 32.9 32.5 32.2 32.0  RDW 17.8* 17.2* 17.3* 17.2*  LYMPHSABS  1.1  --   --   --   MONOABS 0.8  --   --   --   EOSABS 0.0  --   --   --   BASOSABS 0.0  --   --   --     Chemistries   Recent Labs Lab 06/23/16 2326 06/24/16 0739 06/25/16 0316 06/26/16 0325  NA 139 141 141 139  K 4.7 4.8 4.4 4.3  CL 105 110 109 104  CO2 20* _0 GLUCOSE 281* 176* 132* 199*  BUN 42* 42* 49* 49*  CREATININE 3.07* 2.85* 2.96* 2.91*  CALCIUM 8.0* 7.9* 7.9* 7.9*  MG  --  1.8  --   --   AST 32  --   --   --   ALT 26  --   --   --     ALKPHOS 72  --   --   --   BILITOT 0.4  --   --   --    ------------------------------------------------------------------------------------------------------------------ No results for input(s): CHOL, HDL, LDLCALC, TRIG, CHOLHDL, LDLDIRECT in the last 72 hours.  Lab Results  Component Value Date   HGBA1C 7.5 06/05/2016   ------------------------------------------------------------------------------------------------------------------  Recent Labs  06/24/16 0739  TSH 2.923   ------------------------------------------------------------------------------------------------------------------ No results for input(s): VITAMINB12, FOLATE, FERRITIN, TIBC, IRON, RETICCTPCT in the last 72 hours.  Coagulation profile No results for input(s): INR, PROTIME in the last 168 hours.  No results for input(s): DDIMER in the last 72 hours.  Cardiac Enzymes  Recent Labs Lab 06/23/16 2326 06/24/16 0739 06/24/16 1149  TROPONINI 0.13* 0.28* 0.29*   ------------------------------------------------------------------------------------------------------------------    Component Value Date/Time   BNP 355.0 (H) 06/23/2016 2326    Inpatient Medications  Scheduled Meds: . aspirin  81 mg Oral Daily  . budesonide (PULMICORT) nebulizer solution  0.5 mg Nebulization BID  . carvedilol  6.25 mg Oral BID WC  . clopidogrel  75 mg Oral Daily  . furosemide  80 mg Intravenous Q12H  . heparin  5,000 Units Subcutaneous Q8H  . heparin  5,000 Units Subcutaneous Q8H  . hydrALAZINE  50 mg Oral Q8H  . insulin aspart  0-9 Units Subcutaneous TID WC  . insulin glargine  85 Units Subcutaneous QHS  . ipratropium-albuterol  3 mL Nebulization Q6H  . isosorbide mononitrate  60 mg Oral Daily  . metolazone  2.5 mg Oral Once  . predniSONE  40 mg Oral Q breakfast  . simvastatin  20 mg Oral QHS  . sodium chloride flush  3 mL Intravenous Q12H  . sodium chloride flush  3 mL Intravenous Q12H   Continuous  Infusions: PRN Meds:.sodium chloride, sodium chloride, acetaminophen **OR** acetaminophen, acetaminophen, albuterol, hydrALAZINE, ondansetron **OR** ondansetron (ZOFRAN) IV, ondansetron (ZOFRAN) IV, sodium chloride flush, sodium chloride flush  Micro Results Recent Results (from the past 240 hour(s))  MRSA PCR Screening     Status: None   Collection Time: 06/23/16  9:34 PM  Result Value Ref Range Status   MRSA by PCR NEGATIVE NEGATIVE Final    Comment:        The GeneXpert MRSA Assay (FDA approved for NASAL specimens only), is one component of a comprehensive MRSA colonization surveillance program. It is not intended to diagnose MRSA infection nor to guide or monitor treatment for MRSA infections.     Radiology Reports Dg Chest Portable 1 View  Result Date: 06/26/2016 CLINICAL DATA:  Respiratory failure. EXAM: PORTABLE CHEST 1 VIEW COMPARISON:  06/23/2016.  CT 01/01/ 2018.  FINDINGS: Persistent mediastinal fullness noted consistent with previously identified adenopathy. Cardiomegaly with diffuse bilateral from interstitial prominence and bilateral pleural effusions consistent congestive heart failure. Progressive bibasilar atelectasis. No pneumothorax . IMPRESSION: 1. Persistent mediastinal fullness consistent previously identified adenopathy. 2. Cardiomegaly with diffuse bilateral pulmonary interstitial prominence and small bilateral pleural effusions consistent with congestive heart failure. 3.  Progressive bibasilar atelectasis . Electronically Signed   By: Marcello Moores  Register   On: 06/26/2016 06:49   Dg Chest Port 1 View  Result Date: 06/24/2016 CLINICAL DATA:  Shortness of breath.  History of diabetes, CHF. EXAM: PORTABLE CHEST 1 VIEW COMPARISON:  CT chest June 22, 2016 FINDINGS: Cardiac silhouette is moderately enlarged, unchanged. Mediastinal silhouette is nonsuspicious. Diffusely coarsened pulmonary interstitium. Increased lung volumes. No pleural effusion or focal consolidation. No  pneumothorax. Soft tissue planes and included osseous structures are nonsuspicious. IMPRESSION: Stable cardiomegaly. Probable COPD, in addition to diffuse coarsened pulmonary interstitium better characterized on yesterday's CT chest. Electronically Signed   By: Elon Alas M.D.   On: 06/24/2016 02:19     ELGERGAWY, DAWOOD M.D on 06/26/2016 at 12:26 PM  Between 7am to 7pm - Pager - [phone]  After 7pm go to [email or web address] - password [facility]  [facility] -  Office  [phone]

## 2016-06-26 NOTE — H&P (View-Only) (Signed)
Patient ID: Gabriel Alvarez, male   DOB: 18-Jun-1951, 66 y.o.   MRN: [id number]   SUBJECTIVE: Patient diuresed well yesterday.  Weight not accurate (bed weight). Sats now in low 90s on nasal cannula.  Renal function stably elevated.   Scheduled Meds: . aspirin  81 mg Oral Daily  . budesonide (PULMICORT) nebulizer solution  0.5 mg Nebulization BID  . carvedilol  6.25 mg Oral BID WC  . clopidogrel  75 mg Oral Daily  . furosemide  80 mg Intravenous Q12H  . heparin  5,000 Units Subcutaneous Q8H  . hydrALAZINE  50 mg Oral Q8H  . insulin aspart  0-9 Units Subcutaneous TID WC  . insulin glargine  85 Units Subcutaneous QHS  . ipratropium-albuterol  3 mL Nebulization Q6H  . isosorbide mononitrate  30 mg Oral Daily  . predniSONE  40 mg Oral Q breakfast  . simvastatin  20 mg Oral QHS   Continuous Infusions: PRN Meds:.acetaminophen **OR** acetaminophen, albuterol, hydrALAZINE, ondansetron **OR** ondansetron (ZOFRAN) IV    Vitals:   06/25/16 2101 06/25/16 2300 06/26/16 0300 06/26/16 0756  BP: (!) 132/99 (!) 146/75 (!) 160/92 (!) 170/66  Pulse:  73 84 84  Resp:  (!) 37 (!) 26 17  Temp:  98.5 F (36.9 C) 98 F (36.7 C) 98.2 F (36.8 C)  TempSrc:  Oral Oral Oral  SpO2:  92% 93% 90%  Weight:   (!) 372 lb (168.7 kg)   Height:        Intake/Output Summary (Last 24 hours) at 06/26/16 0821 Last data filed at 06/26/16 0559  Gross per 24 hour  Intake             1440 ml  Output             3975 ml  Net            -2535 ml    LABS: Basic Metabolic Panel:  Recent Labs  06/24/16 0739 06/25/16 0316 06/26/16 0325  NA 141 141 139  K 4.8 4.4 4.3  CL 110 109 104  CO2 _0 GLUCOSE 176* 132* 199*  BUN 42* 49* 49*  CREATININE 2.85* 2.96* 2.91*  CALCIUM 7.9* 7.9* 7.9*  MG 1.8  --   --    Liver Function Tests:  Recent Labs  06/23/16 2326  AST 32  ALT 26  ALKPHOS 72  BILITOT 0.4  PROT 5.0*  ALBUMIN 1.9*   No results for input(s): LIPASE, AMYLASE in the last 72  hours. CBC:  Recent Labs  06/23/16 2326  06/25/16 0316 06/26/16 0325  WBC 15.0*  < > 13.1* 11.4*  NEUTROABS 13.1*  --   --   --   HGB 14.3  < > 14.1 14.1  HCT 43.5  < > 43.8 44.1  MCV 81.0  < > 80.7 81.5  PLT 178  < > 241 198  < > = values in this interval not displayed. Cardiac Enzymes:  Recent Labs  06/23/16 2326 06/24/16 0739 06/24/16 1149  TROPONINI 0.13* 0.28* 0.29*   BNP: Invalid input(s): POCBNP D-Dimer: No results for input(s): DDIMER in the last 72 hours. Hemoglobin A1C: No results for input(s): HGBA1C in the last 72 hours. Fasting Lipid Panel: No results for input(s): CHOL, HDL, LDLCALC, TRIG, CHOLHDL, LDLDIRECT in the last 72 hours. Thyroid Function Tests:  Recent Labs  06/24/16 0739  TSH 2.923   Anemia Panel: No results for input(s): VITAMINB12, FOLATE, FERRITIN, TIBC, IRON, RETICCTPCT in the last 72  hours.  RADIOLOGY: Dg Chest Portable 1 View  Result Date: 06/26/2016 CLINICAL DATA:  Respiratory failure. EXAM: PORTABLE CHEST 1 VIEW COMPARISON:  06/23/2016.  CT 01/01/ 2018. FINDINGS: Persistent mediastinal fullness noted consistent with previously identified adenopathy. Cardiomegaly with diffuse bilateral from interstitial prominence and bilateral pleural effusions consistent congestive heart failure. Progressive bibasilar atelectasis. No pneumothorax . IMPRESSION: 1. Persistent mediastinal fullness consistent previously identified adenopathy. 2. Cardiomegaly with diffuse bilateral pulmonary interstitial prominence and small bilateral pleural effusions consistent with congestive heart failure. 3.  Progressive bibasilar atelectasis . Electronically Signed   By: Marcello Moores  Register   On: 06/26/2016 06:49   Dg Chest Port 1 View  Result Date: 06/24/2016 CLINICAL DATA:  Shortness of breath.  History of diabetes, CHF. EXAM: PORTABLE CHEST 1 VIEW COMPARISON:  CT chest June 22, 2016 FINDINGS: Cardiac silhouette is moderately enlarged, unchanged. Mediastinal silhouette  is nonsuspicious. Diffusely coarsened pulmonary interstitium. Increased lung volumes. No pleural effusion or focal consolidation. No pneumothorax. Soft tissue planes and included osseous structures are nonsuspicious. IMPRESSION: Stable cardiomegaly. Probable COPD, in addition to diffuse coarsened pulmonary interstitium better characterized on yesterday's CT chest. Electronically Signed   By: Elon Alas M.D.   On: 06/24/2016 02:19    PHYSICAL EXAM General: NAD, obese.  Neck: Thick, JVP difficult but suspect elevated, no thyromegaly or thyroid nodule.  Lungs: Distant breath sounds.  CV: Nondisplaced PMI.  Heart regular S1/S2, no S3/S4, no murmur.  1+ edema to knees.   Abdomen: Soft, nontender, no hepatosplenomegaly, no distention.  Neurologic: Alert and oriented x 3.  Psych: Normal affect. Extremities: No clubbing or cyanosis.   TELEMETRY: Reviewed telemetry pt in NSR  ASSESSMENT AND PLAN: 66 yo with history of CKD stage III, DM, HTN, CAD presented to [facility] with acute CHF.  Echo was done showing EF 15-20%, he was hypoxic requiring Bipap.  Creatinine elevated, ?chronicity as no recent prior BMET.  Transferred to [facility] for management.  1. Acute systolic CHF: Uncertain chronicity of low EF.  Echo (1/18) at [facility] with EF 15-20%, moderate LVH.  Last echo in 2013 had normal EF.  History of CAD, so possible ischemic cardiomyopathy.  Also may have sarcoid based on CT chest at [facility].  Currently, I suspect that he is volume overloaded (though exam difficult) but management complicated by elevated creatinine (stable today).  BP is stable.  He remains on oxygen by nasal cannula.  - Continue diuresis with Lasix 80 mg IV bid + metolazone 2.5 mg po x 1 again today.  - Continue hydralazine 50 mg tid, increase Imdur to 60 daily.  - Continue low dose Coreg.  - No ACEI/ARB/ARNI/spironolactone with elevated creatinine.  - Will need eventual ischemic workup but creatinine currently prohibitive in  absence of ACS.  - RHC today to assess filling pressures (difficult exam).  We discussed risks/benefits and he agrees to proceed. Attempted to contact wife but no answer, nurse will attempt also.   2. CAD: History of remote mid RCA stent.  Last cath in 2005 with 50% in-stent restenosis in RCA.  Mild stable troponin elevation likely demand ischemia/volume overload.  - Continue ASA (can decrease to 81 daily), Plavix, statin.  - Will need eventual ischemic workup but creatinine currently prohibitive in absence of ACS.  3. ?AKI on CKD stage III: Most recent prior creatinine was 1.48 in 2014, so not sure of chronicity of elevated creatinine (acute versus chronic).  Will have to watch closely with diuresis.  Lowering of renal venous pressure with diuresis may  help renal function. Creatinine stable today at 2.9.  4. Abnormal chest CT: Concern for sarcoidosis.  He has been started on prednisone.  Pulmonary following.  ACE level is not elevated.  5. DM II: Per primary service.  6. Gout: Stable.  7. OSA/OHS: Suspected.  He will use Bipap at night and oxygen during the day.   Loralie Champagne 06/26/2016 8:21 AM

## 2016-06-26 NOTE — Progress Notes (Signed)
Inpatient Diabetes Program Recommendations  AACE/ADA: New Consensus Statement on Inpatient Glycemic Control (2015)  Target Ranges:  Prepandial:   less than 140 mg/dL      Peak postprandial:   less than 180 mg/dL (1-2 hours)      Critically ill patients:  140 - 180 mg/dL   Results for Gabriel Alvarez, Gabriel Alvarez (MRN [id number]) as of 06/26/2016 10:07  Ref. Range 06/25/2016 11:38 06/25/2016 16:28 06/25/2016 22:18 06/26/2016 03:34 06/26/2016 07:54  Glucose-Capillary Latest Ref Range: 65 - 99 mg/dL 132 (H) 400 (H) 336 (H) 207 (H) 142 (H)   Review of Glycemic Control  Diabetes history: DM 2 Outpatient Diabetes medications: Lantus 90 units daily, Humalog 50 units with supper Current orders for Inpatient glycemic control: Novolog sensitive tid with meals, Prednisone 40 mg daily, Lantus 85 units daily  Inpatient Diabetes Program Recommendations:   Glucose increases into the 400's after meals.   Please consider adding Novolog meal coverage 8 units tid with meals (hold if patient eats less than 50%).  Thanks,  Tama Headings RN, MSN, [facility] Inpatient Diabetes Coordinator Team Pager [phone] (8a-5p)

## 2016-06-27 DIAGNOSIS — I272 Pulmonary hypertension, unspecified: Secondary | ICD-10-CM

## 2016-06-27 LAB — GLUCOSE, CAPILLARY
Glucose-Capillary: 124 mg/dL — ABNORMAL HIGH (ref 65–99)
Glucose-Capillary: 234 mg/dL — ABNORMAL HIGH (ref 65–99)
Glucose-Capillary: 311 mg/dL — ABNORMAL HIGH (ref 65–99)
Glucose-Capillary: 368 mg/dL — ABNORMAL HIGH (ref 65–99)

## 2016-06-27 LAB — BASIC METABOLIC PANEL
Anion gap: 9 (ref 5–15)
BUN: 55 mg/dL — AB (ref 6–20)
CALCIUM: 8 mg/dL — AB (ref 8.9–10.3)
CO2: 26 mmol/L (ref 22–32)
CREATININE: 2.76 mg/dL — AB (ref 0.61–1.24)
Chloride: 102 mmol/L (ref 101–111)
GFR calc Af Amer: 26 mL/min — ABNORMAL LOW (ref >60)
GFR calc non Af Amer: 23 mL/min — ABNORMAL LOW (ref >60)
GLUCOSE: 192 mg/dL — AB (ref 65–99)
Potassium: 5.4 mmol/L — ABNORMAL HIGH (ref 3.5–5.1)
Sodium: 137 mmol/L (ref 135–145)

## 2016-06-27 LAB — PROCALCITONIN: Procalcitonin: 0.19 ng/mL

## 2016-06-27 MED ORDER — INSULIN ASPART 100 UNIT/ML ~~LOC~~ SOLN
5.0000 [IU] | Freq: Once | SUBCUTANEOUS | Status: AC
  Administered 2016-06-27: 5 [IU] via SUBCUTANEOUS

## 2016-06-27 MED ORDER — INSULIN GLARGINE 100 UNIT/ML ~~LOC~~ SOLN
95.0000 [IU] | Freq: Every day | SUBCUTANEOUS | Status: DC
  Administered 2016-06-27: 95 [IU] via SUBCUTANEOUS
  Filled 2016-06-27: qty 0.95

## 2016-06-27 MED ORDER — DIPHENHYDRAMINE HCL 25 MG PO CAPS
25.0000 mg | ORAL_CAPSULE | Freq: Once | ORAL | Status: AC
Start: 2016-06-27 — End: 2016-06-27
  Administered 2016-06-27: 25 mg via ORAL
  Filled 2016-06-27: qty 1

## 2016-06-27 MED ORDER — BISACODYL 10 MG RE SUPP: 10.0000 mg | Freq: Once | RECTAL | Status: DC

## 2016-06-27 NOTE — Progress Notes (Signed)
Orthopedic Tech Progress Note Patient Details:  Gabriel Alvarez 1951-05-26 [id number]  Ortho Devices Type of Ortho Device: Louretta Parma boot Ortho Device/Splint Location: bilateral Ortho Device/Splint Interventions: Application   [name] 06/27/2016, 3:04 PM

## 2016-06-27 NOTE — Progress Notes (Signed)
PROGRESS NOTE                                                                                                                                                                                                             Patient Demographics:    Gabriel Alvarez, is a 66 y.o. male, DOB - 28-Nov-1950, XBM:[id number]  Admit date - 06/23/2016   Admitting Physician Waldemar Dickens, MD  Outpatient Primary MD for the patient is Manon Hilding, MD  LOS - 4  No chief complaint on file.      Brief Narrative  WYNSTON ROMEY is a 66 y.o. male with history COPD, ongoing tobacco abuse, diabetes mellitus type 2 and CAD status post stenting as per the patient (I don't have any records of this) presented to the ER at [facility] with complaints of shortness of breath on 06/21/2016, Echo showed EF 15% and he was tx to [facility] 1/2 for cardiology eval.  CT chest showed ground glass opacities with some question for sarcoid v edema, Transferred to [facility] 1/3 for further workup.  Subjective:    Vonn Sliger today has, No headache, No chest pain, No abdominal pain - No Nausea, Report generalized weakness, denies any cough,Denies any dyspnea at rest(he is currently on 30 L-on nasal cannula!),Asking when he can go home.    Assessment  & Plan :    Principal Problem:   Acute respiratory failure with hypoxia (HCC) Active Problems:   Coronary artery disease involving native coronary artery of native heart without angina pectoris   Diabetes mellitus type 2 with peripheral artery disease (HCC)   Acute systolic CHF (congestive heart failure) (HCC)   Acute renal failure superimposed on stage 4 chronic kidney disease (HCC)   Hypertensive urgency   Acute CHF (congestive heart failure) (HCC)   SOB (shortness of breath)   Cardiomyopathy (HCC)   Acute respiratory failure with hypoxia  - multifactorial primarily acute systolic/Diastolic heart failure UU72-53% with grade 2 diastolic  dysfunction, seen by cardiology, on diuresis. 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  Acute systolic / diastolic CHF -Patient presents with anasarca, EF 15-20% with a grade 2 diastolic dysfunction. - Management per  cardiology , currently on IV Lasix , and metolazone when necessary , monitor ins and outs closely so far -8.6 L since admission . - Continue with low-dose Coreg, hydralazine/Imdur, - No ACEI/ARB/ARNI/spironolactone with elevated creatinine. - RHC 1/5 significant for mildly elevated right and left heart filling pressure, and severe pulmonary arterial hypertension.  Severe pulmonary arterial hypertension - Per cards this is most likely group 3 pulmonary hypertension, most likely due to OHS/OSA, will encourage to use BiPAP at night, and tinea with oxygen, will need VQ scan when more stable.  Possible sarcoidosis - Pulmonary consulted, on prednisone, and Pulmicort and nebs  Acute on chronic kidney disease stage 3-4  - given the significant proteinuria and low albumin patient probably has nephrotic syndrome from diabetes. At this time patient is on Lasix. Holding off ACE inhibitor due to worsening renal function, creatinine appears to be stable/mild improving on diuresis, continue to monitor closely - Most likely cardiorenal syndrome contributing to his renal failure, called his PCP office, most recent lab was in 2014 with a creatinine of 1.48.  Hypertension - With elevated blood pressure on admission, currently on Coreg, hydralazine, Imdur  Diabetes mellitus type 2  - last hemoglobin A1c 2 days before admission was 7.4. Patient's blood sugar is elevated due to patient being on steroids.  - Labile, but generally uncontrolled, increase Lantus from 85-95 units ,continue with insulin sliding scale, and  8 units NovoLog  before meals  CAD - Continue with aspirin, Plavix and statin - Elevated troponins non-ACS pattern per cardiology.  History of gout on allopurinol.  -  on allopurinol   Code Status : Full  Family Communication  : wife at bedside   Disposition Plan  : pending further work up.  Consults  :  Cardiology  Procedures  : None  DVT Prophylaxis  :   Heparin - SCDs   Lab Results  Component Value Date   PLT 198 06/26/2016    Antibiotics  :   Anti-infectives    None        Objective:   Vitals:   06/27/16 0323 06/27/16 0406 06/27/16 0821 06/27/16 0950  BP:  (!) 149/67  (!) 155/72  Pulse:  82 80 81  Resp:  16 (!) 23   Temp:  98.3 F (36.8 C) 98.5 F (36.9 C)   TempSrc:  Oral Oral   SpO2: 90% (!) 89% 91%   Weight:  (!) 139.2 kg (306 lb 12.8 oz)    Height:        Wt Readings from Last 3 Encounters:  06/27/16 (!) 139.2 kg (306 lb 12.8 oz)  06/05/16 (!) 141.1 kg (311 lb)  06/05/16 (!) 141.1 kg (311 lb)     Intake/Output Summary (Last 24 hours) at 06/27/16 1138 Last data filed at 06/27/16 0930  Gross per 24 hour  Intake              590 ml  Output             2725 ml  Net            -2135 ml     Physical Exam  Awake Alert, Oriented X 3, No new F.N deficits, Normal affect Supple Neck,No JVD, Symmetrical Chest wall movement, Good air movement bilaterally,Bibasilar Rales RRR,No Gallops,Rubs or new Murmurs, No Parasternal Heave +ve B.Sounds, Abd Soft, No tenderness, , No rebound - guarding or rigidity. No Cyanosis, Clubbing , No new Rash or bruise  , significant anasarca  Data Review:    CBC  Recent Labs Lab 06/23/16 2326 06/24/16 0739 06/25/16 0316 06/26/16 0325  WBC 15.0* 16.2* 13.1* 11.4*  HGB 14.3 14.5 14.1 14.1  HCT 43.5 44.6 43.8 44.1  PLT 178 232 241 198  MCV 81.0 80.1 80.7 81.5  MCH 26.6 26.0 26.0 26.1  MCHC 32.9 32.5 32.2 32.0  RDW 17.8* 17.2* 17.3* 17.2*  LYMPHSABS 1.1  --   --   --   MONOABS 0.8  --   --   --   EOSABS 0.0  --   --    --   BASOSABS 0.0  --   --   --     Chemistries   Recent Labs Lab 06/23/16 2326 06/24/16 0739 06/25/16 0316 06/26/16 0325 06/27/16 0219  NA 139 141 141 139 137  K 4.7 4.8 4.4 4.3 5.4*  CL 105 110 109 104 102  CO2 20* _0 GLUCOSE 281* 176* 132* 199* 192*  BUN 42* 42* 49* 49* 55*  CREATININE 3.07* 2.85* 2.96* 2.91* 2.76*  CALCIUM 8.0* 7.9* 7.9* 7.9* 8.0*  MG  --  1.8  --   --   --   AST 32  --   --   --   --   ALT 26  --   --   --   --   ALKPHOS 72  --   --   --   --   BILITOT 0.4  --   --   --   --    ------------------------------------------------------------------------------------------------------------------ No results for input(s): CHOL, HDL, LDLCALC, TRIG, CHOLHDL, LDLDIRECT in the last 72 hours.  Lab Results  Component Value Date   HGBA1C 7.5 06/05/2016   ------------------------------------------------------------------------------------------------------------------ No results for input(s): TSH, T4TOTAL, T3FREE, THYROIDAB in the last 72 hours.  Invalid input(s): FREET3 ------------------------------------------------------------------------------------------------------------------ No results for input(s): VITAMINB12, FOLATE, FERRITIN, TIBC, IRON, RETICCTPCT in the last 72 hours.  Coagulation profile No results for input(s): INR, PROTIME in the last 168 hours.  No results for input(s): DDIMER in the last 72 hours.  Cardiac Enzymes  Recent Labs Lab 06/23/16 2326 06/24/16 0739 06/24/16 1149  TROPONINI 0.13* 0.28* 0.29*   ------------------------------------------------------------------------------------------------------------------    Component Value Date/Time   BNP 355.0 (H) 06/23/2016 2326    Inpatient Medications  Scheduled Meds: . aspirin  81 mg Oral Daily  . budesonide (PULMICORT) nebulizer solution  0.5 mg Nebulization BID  . carvedilol  6.25 mg Oral BID WC  . clopidogrel  75 mg Oral Daily  . furosemide  80 mg Intravenous  Q12H  . heparin  5,000 Units Subcutaneous Q8H  . hydrALAZINE  50 mg Oral Q8H  . insulin aspart  0-9 Units Subcutaneous TID WC  . insulin aspart  8 Units Subcutaneous TID WC  . insulin glargine  95 Units Subcutaneous QHS  . ipratropium-albuterol  3 mL Nebulization Q6H  . isosorbide mononitrate  60 mg Oral Daily  . predniSONE  40 mg Oral Q breakfast  . simvastatin  20 mg Oral QHS  . sodium chloride flush  3 mL Intravenous Q12H  . sodium chloride flush  3 mL Intravenous Q12H   Continuous Infusions: PRN Meds:.sodium chloride, sodium chloride, acetaminophen **OR** acetaminophen, acetaminophen, albuterol, hydrALAZINE, ondansetron **OR** ondansetron (ZOFRAN) IV, ondansetron (ZOFRAN) IV, sodium chloride flush, sodium chloride flush  Micro Results Recent Results (from the past 240 hour(s))  MRSA PCR Screening     Status: None   Collection Time: 06/23/16  9:34 PM  Result  Value Ref Range Status   MRSA by PCR NEGATIVE NEGATIVE Final    Comment:        The GeneXpert MRSA Assay (FDA approved for NASAL specimens only), is one component of a comprehensive MRSA colonization surveillance program. It is not intended to diagnose MRSA infection nor to guide or monitor treatment for MRSA infections.     Radiology Reports Dg Chest Portable 1 View  Result Date: 06/26/2016 CLINICAL DATA:  Respiratory failure. EXAM: PORTABLE CHEST 1 VIEW COMPARISON:  06/23/2016.  CT 01/01/ 2018. FINDINGS: Persistent mediastinal fullness noted consistent with previously identified adenopathy. Cardiomegaly with diffuse bilateral from interstitial prominence and bilateral pleural effusions consistent congestive heart failure. Progressive bibasilar atelectasis. No pneumothorax . IMPRESSION: 1. Persistent mediastinal fullness consistent previously identified adenopathy. 2. Cardiomegaly with diffuse bilateral pulmonary interstitial prominence and small bilateral pleural effusions consistent with congestive heart failure. 3.   Progressive bibasilar atelectasis . Electronically Signed   By: Marcello Moores  Register   On: 06/26/2016 06:49   Dg Chest Port 1 View  Result Date: 06/24/2016 CLINICAL DATA:  Shortness of breath.  History of diabetes, CHF. EXAM: PORTABLE CHEST 1 VIEW COMPARISON:  CT chest June 22, 2016 FINDINGS: Cardiac silhouette is moderately enlarged, unchanged. Mediastinal silhouette is nonsuspicious. Diffusely coarsened pulmonary interstitium. Increased lung volumes. No pleural effusion or focal consolidation. No pneumothorax. Soft tissue planes and included osseous structures are nonsuspicious. IMPRESSION: Stable cardiomegaly. Probable COPD, in addition to diffuse coarsened pulmonary interstitium better characterized on yesterday's CT chest. Electronically Signed   By: Elon Alas M.D.   On: 06/24/2016 02:19     Lunette Tapp M.D on 06/27/2016 at 11:38 AM  Between 7am to 7pm - Pager - [phone]  After 7pm go to [email or web address] - password [facility]  [facility] -  Office  [phone]

## 2016-06-27 NOTE — Progress Notes (Signed)
PULMONARY / CRITICAL CARE MEDICINE   Name: DAYRON ODLAND MRN: [id number] DOB: Mar 25, 1951    ADMISSION DATE:  06/23/2016 CONSULTATION DATE:  70/4  REFERRING MD:  Elgergawy ([facility])   CHIEF COMPLAINT:  Respiratory failure  HISTORY OF PRESENT ILLNESS:   66yo male smoker with hx DM, CAD, chronic combined CHF, CKD IV, HTN initially presented to [facility] hospital with SOB, weight gain of 80lbs in last 30 days.  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  Echo showed EF 15% and he was tx to [facility] 1/2 for cardiology eval.  CT chest showed ground glass opacities with some question for sarcoid v edema.  He has continued to require bipap despite diuresis and PCCM consulted.   Currently c/o mild dyspnea although improved, mostly with exertion.  Also c/o orthopnea, weight gain, BLE edema.  Denies PND, chest pain, hemoptysis.  DOES have family hx of sarcoid -- his daughter died last year at age 28 "of sarcoid".  No other autoimmune family hx.  Current smoker - abt 1/2ppd.  40 pack years.    SUBJECTIVE:  Remains on HFNC. Dyspnea improved appears comfortable oob to chair, always sleeps sitting up due to chr back pain  VITAL SIGNS: BP (!) 155/72   Pulse 81   Temp 98.5 F (36.9 C) (Oral)   Resp (!) 23   Ht _0  (1.803 m)   Wt (!) 306 lb 12.8 oz (139.2 kg)   SpO2 91%   BMI 42.79 kg/m   INTAKE / OUTPUT: I/O last 3 completed shifts: In: 49 [P.O.:1070] Out: 5825 [Urine:5825]  PHYSICAL EXAMINATION: General:  Pleasant, obese male, NAD Neuro:  Awake, alert, appropriate, MAE  HEENT:  Mm moist Cardiovascular:  s1s2 distant  Lungs:  resps even nonlabored, diminished, scattered crackles  Abdomen:  Round, distended, non tender, +bs  Musculoskeletal:  Warm and dry, 2+ BLE edema  LABS:  BMET  Recent Labs Lab 06/25/16 0316 06/26/16 0325 06/27/16 0219  NA 141 139 137  K 4.4 4.3 5.4*  CL 109 104 102  CO2 _1 BUN 49* 49* 55*  CREATININE 2.96* 2.91* 2.76*  GLUCOSE  132* 199* 192*    Electrolytes  Recent Labs Lab 06/24/16 0739 06/25/16 0316 06/26/16 0325 06/27/16 0219  CALCIUM 7.9* 7.9* 7.9* 8.0*  MG 1.8  --   --   --     CBC  Recent Labs Lab 06/24/16 0739 06/25/16 0316 06/26/16 0325  WBC 16.2* 13.1* 11.4*  HGB 14.5 14.1 14.1  HCT 44.6 43.8 44.1  PLT 232 241 198    Sepsis Markers  Recent Labs Lab 06/25/16 1614 06/26/16 0325 06/27/16 0219  PROCALCITON 0.19 0.16 0.19    ABG  Recent Labs Lab 06/24/16 0130  PHART 7.351  PCO2ART 47.4  PO2ART 72.1*    Liver Enzymes  Recent Labs Lab 06/23/16 2326  AST 32  ALT 26  ALKPHOS 72  BILITOT 0.4  ALBUMIN 1.9*    Cardiac Enzymes  Recent Labs Lab 06/23/16 2326 06/24/16 0739 06/24/16 1149  TROPONINI 0.13* 0.28* 0.29*    Glucose  Recent Labs Lab 06/26/16 0334 06/26/16 0754 06/26/16 1209 06/26/16 1632 06/26/16 2154 06/27/16 0820  GLUCAP 207* 142* 80 399* 314* 124*    Imaging No results found.   STUDIES:  Echo ([facility]) >>>Echocardiogram was done on 06/22/2016 and showed moderate concentric left ventricular hypertrophy with an LVEF of 15-20%. Also with grade 2 diastolic dysfunction. He had normal biatrial size.Last echo in 2013  with normal LV function.  CT chest ([facility])>>> persistent mediastinal and hilar adenopathy unchanged from 2013. Interstitial prominence and patchy groundglass opacities overall findings favoring sarcoidosis could possibly be related to pulmonary edema and small right pleural effusion noted.  RHC 1/5 >>> mildly elevated right and left heart filling pressures, severe PAH  RA mean 12 RV 73/16 PA 80/33, mean 50 PCWP mean 18  Oxygen saturations: PA 65% AO 89%  Cardiac Output ([facility]) 7.31  Cardiac Index ([facility]) 2.89 PVR: 4.4 WU    CULTURES: Sputum culture  ANTIBIOTICS: None  SIGNIFICANT EVENTS: 1/3> transferred to [facility]  LINES/TUBES: None  DISCUSSION: 66yo male with hx CKD, CAD now with acute on chronic CHF with  EG 15% and acute hypoxic respiratory failure requiring bipap.   ASSESSMENT / PLAN: Acute hypoxic respiratory failure  Tobacco abuse  Presumed COPD Suspected OSA/OHS Abnormal chest CT -- does have strong family hx for sarcoid but hard to interpret current CT given significant volume overload.  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 FCCP.  Pulmonary & Critical care Pager [phone] If no response call [phone]    06/27/2016  11:38 AM

## 2016-06-27 NOTE — Progress Notes (Signed)
Patient ID: Gabriel Alvarez, male   DOB: 12-Dec-1950, 66 y.o.   MRN: [id number]   ADVANCED HF ROUNDING NOTE  SUBJECTIVE:   Underwent RHC on 1/5. 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 Diuresing well. Out almost 4L. Weight down 1 pound (? Accuracy of weights)  Breathing better. Creatinine slightly improved.    RHC 1/5 RA mean 12 RV 73/16 PA 80/33, mean 50 PCWP mean 18 Cardiac Output (Fick) 7.31  Cardiac Index (Fick) 2.89 PVR: 4.4 WU    Scheduled Meds: . aspirin  81 mg Oral Daily  . budesonide (PULMICORT) nebulizer solution  0.5 mg Nebulization BID  . carvedilol  6.25 mg Oral BID WC  . clopidogrel  75 mg Oral Daily  . furosemide  80 mg Intravenous Q12H  . heparin  5,000 Units Subcutaneous Q8H  . hydrALAZINE  50 mg Oral Q8H  . insulin aspart  0-9 Units Subcutaneous TID WC  . insulin aspart  8 Units Subcutaneous TID WC  . insulin glargine  95 Units Subcutaneous QHS  . ipratropium-albuterol  3 mL Nebulization Q6H  . isosorbide mononitrate  60 mg Oral Daily  . predniSONE  40 mg Oral Q breakfast  . simvastatin  20 mg Oral QHS  . sodium chloride flush  3 mL Intravenous Q12H  . sodium chloride flush  3 mL Intravenous Q12H   Continuous Infusions: PRN Meds:.sodium chloride, sodium chloride, acetaminophen **OR** acetaminophen, acetaminophen, albuterol, hydrALAZINE, ondansetron **OR** ondansetron (ZOFRAN) IV, ondansetron (ZOFRAN) IV, sodium chloride flush, sodium chloride flush    Vitals:   06/27/16 0323 06/27/16 0406 06/27/16 0821 06/27/16 0950  BP:  (!) 149/67  (!) 155/72  Pulse:  82 80 81  Resp:  16 (!) 23   Temp:  98.3 F (36.8 C) 98.5 F (36.9 C)   TempSrc:  Oral Oral   SpO2: 90% (!) 89% 91%   Weight:  (!) 139.2 kg (306 lb 12.8 oz)    Height:        Intake/Output Summary (Last 24 hours) at 06/27/16 1136 Last data filed at 06/27/16 0930  Gross per 24 hour  Intake              590 ml  Output             2725 ml  Net            -2135 ml    LABS: Basic Metabolic  Panel:  Recent Labs  06/26/16 0325 06/27/16 0219  NA 139 137  K 4.3 5.4*  CL 104 102  CO2 25 26  GLUCOSE 199* 192*  BUN 49* 55*  CREATININE 2.91* 2.76*  CALCIUM 7.9* 8.0*   Liver Function Tests: No results for input(s): AST, ALT, ALKPHOS, BILITOT, PROT, ALBUMIN in the last 72 hours. No results for input(s): LIPASE, AMYLASE in the last 72 hours. CBC:  Recent Labs  06/25/16 0316 06/26/16 0325  WBC 13.1* 11.4*  HGB 14.1 14.1  HCT 43.8 44.1  MCV 80.7 81.5  PLT 241 198   Cardiac Enzymes:  Recent Labs  06/24/16 1149  TROPONINI 0.29*   BNP: Invalid input(s): POCBNP D-Dimer: No results for input(s): DDIMER in the last 72 hours. Hemoglobin A1C: No results for input(s): HGBA1C in the last 72 hours. Fasting Lipid Panel: No results for input(s): CHOL, HDL, LDLCALC, TRIG, CHOLHDL, LDLDIRECT in the last 72 hours. Thyroid Function Tests: No results for input(s): TSH, T4TOTAL, T3FREE, THYROIDAB in the last 72 hours.  Invalid input(s): FREET3 Anemia Panel: No results  for input(s): VITAMINB12, FOLATE, FERRITIN, TIBC, IRON, RETICCTPCT in the last 72 hours.  RADIOLOGY: Dg Chest Portable 1 View  Result Date: 06/26/2016 CLINICAL DATA:  Respiratory failure. EXAM: PORTABLE CHEST 1 VIEW COMPARISON:  06/23/2016.  CT 01/01/ 2018. FINDINGS: Persistent mediastinal fullness noted consistent with previously identified adenopathy. Cardiomegaly with diffuse bilateral from interstitial prominence and bilateral pleural effusions consistent congestive heart failure. Progressive bibasilar atelectasis. No pneumothorax . IMPRESSION: 1. Persistent mediastinal fullness consistent previously identified adenopathy. 2. Cardiomegaly with diffuse bilateral pulmonary interstitial prominence and small bilateral pleural effusions consistent with congestive heart failure. 3.  Progressive bibasilar atelectasis . Electronically Signed   By: Marcello Moores  Register   On: 06/26/2016 06:49   Dg Chest Port 1 View  Result  Date: 06/24/2016 CLINICAL DATA:  Shortness of breath.  History of diabetes, CHF. EXAM: PORTABLE CHEST 1 VIEW COMPARISON:  CT chest June 22, 2016 FINDINGS: Cardiac silhouette is moderately enlarged, unchanged. Mediastinal silhouette is nonsuspicious. Diffusely coarsened pulmonary interstitium. Increased lung volumes. No pleural effusion or focal consolidation. No pneumothorax. Soft tissue planes and included osseous structures are nonsuspicious. IMPRESSION: Stable cardiomegaly. Probable COPD, in addition to diffuse coarsened pulmonary interstitium better characterized on yesterday's CT chest. Electronically Signed   By: Elon Alas M.D.   On: 06/24/2016 02:19    PHYSICAL EXAM General: NAD, obese. Sitting in chair NAD.  Neck: Thick, JVP difficult but suspect elevated, no thyromegaly or thyroid nodule.  Lungs: Distant breath sounds.  CV: Nondisplaced PMI.  Heart regular S1/S2, no S3/S4, no murmur.  2-3+ edema to knees.   Abdomen: markedly obese.Soft, nontender, + distention.  Neurologic: Alert and oriented x 3.  Psych: Normal affect. Extremities: No clubbing or cyanosis.   TELEMETRY: Reviewed telemetry pt in NSR  ASSESSMENT AND PLAN: 66 yo with history of CKD stage III, DM, HTN, CAD presented to [facility] with acute CHF.  Echo was done showing EF 15-20%, he was hypoxic requiring Bipap.  Creatinine elevated, ?chronicity as no recent prior BMET.  Transferred to [facility] for management.  1. Acute systolic CHF: Uncertain chronicity of low EF.  Echo (1/18) at [facility] with EF 15-20%, moderate LVH.  Last echo in 2013 had normal EF.  History of CAD, so possible ischemic cardiomyopathy.  Also may have sarcoid based on CT chest at [facility].  Currently, I suspect that he is volume overloaded (though exam difficult) but management complicated by elevated creatinine (stable today).  BP is stable.  He remains on oxygen by nasal cannula.  - RHC 06/26/16 with moderate to severe PAH (likely group 3). PCWP 18 and  normal cardiac output -Volume status and renal function improving with Lasix 80 mg IV bid + metolazone 2.5 mg po daily. He remains markedly volume overloaded with R>>L HF. Will continue IV diuresis for now. Watch renal function closely. Wrap legs.  - Continue hydralazine 50 mg tid, increase Imdur to 60 daily.  - Continue low dose Coreg.  - No ACEI/ARB/ARNI/spironolactone with elevated creatinine.  - Will need eventual ischemic workup but creatinine currently prohibitive in absence of ACS.  2. CAD: History of remote mid RCA stent.  Last cath in 2005 with 50% in-stent restenosis in RCA.  Mild stable troponin elevation likely demand ischemia/volume overload.  - Continue ASA (can decrease to 81 daily), Plavix, statin.  - Will need eventual ischemic workup but creatinine currently prohibitive in absence of ACS.  3. ?AKI on CKD stage III: Most recent prior creatinine was 1.48 in 2014, so not sure of chronicity of elevated  creatinine (acute versus chronic).  Will have to watch closely with diuresis.  Lowering of renal venous pressure with diuresis may help renal function. Creatinine improved today 2.9. -> 2.76 4. Abnormal chest CT: Concern for sarcoidosis.  He has been started on prednisone.  Pulmonary following.  ACE level is not elevated.  5. DM II: Per primary service.  6. Gout: Stable.  7. OSA/OHS: Suspected.  He will use Bipap at night and oxygen during the day.  8. Hyperkalemia: Specimen hemolyzed. Recheck in am.  9. Morbid obesity - needs weight loss.  Glori Bickers MD 06/27/2016 11:36 AM

## 2016-06-28 DIAGNOSIS — I5031 Acute diastolic (congestive) heart failure: Secondary | ICD-10-CM

## 2016-06-28 DIAGNOSIS — I2721 Secondary pulmonary arterial hypertension: Secondary | ICD-10-CM

## 2016-06-28 LAB — BASIC METABOLIC PANEL
Anion gap: 12 (ref 5–15)
BUN: 59 mg/dL — ABNORMAL HIGH (ref 6–20)
CHLORIDE: 100 mmol/L — AB (ref 101–111)
CO2: 25 mmol/L (ref 22–32)
CREATININE: 3.14 mg/dL — AB (ref 0.61–1.24)
Calcium: 8.2 mg/dL — ABNORMAL LOW (ref 8.9–10.3)
GFR, EST AFRICAN AMERICAN: 22 mL/min — AB (ref >60)
GFR, EST NON AFRICAN AMERICAN: 19 mL/min — AB (ref >60)
Glucose, Bld: 209 mg/dL — ABNORMAL HIGH (ref 65–99)
Potassium: 4.2 mmol/L (ref 3.5–5.1)
SODIUM: 137 mmol/L (ref 135–145)

## 2016-06-28 LAB — GLUCOSE, CAPILLARY
GLUCOSE-CAPILLARY: 134 mg/dL — AB (ref 65–99)
Glucose-Capillary: 230 mg/dL — ABNORMAL HIGH (ref 65–99)
Glucose-Capillary: 336 mg/dL — ABNORMAL HIGH (ref 65–99)
Glucose-Capillary: 407 mg/dL — ABNORMAL HIGH (ref 65–99)
Glucose-Capillary: 439 mg/dL — ABNORMAL HIGH (ref 65–99)

## 2016-06-28 MED ORDER — INSULIN ASPART 100 UNIT/ML ~~LOC~~ SOLN
0.0000 [IU] | Freq: Every day | SUBCUTANEOUS | Status: DC
  Administered 2016-06-28: 5 [IU] via SUBCUTANEOUS
  Administered 2016-06-29 – 2016-07-01 (×2): 4 [IU] via SUBCUTANEOUS

## 2016-06-28 MED ORDER — INSULIN GLARGINE 100 UNIT/ML ~~LOC~~ SOLN
60.0000 [IU] | Freq: Two times a day (BID) | SUBCUTANEOUS | Status: DC
  Filled 2016-06-28: qty 0.6

## 2016-06-28 MED ORDER — INSULIN ASPART 100 UNIT/ML ~~LOC~~ SOLN
0.0000 [IU] | Freq: Three times a day (TID) | SUBCUTANEOUS | Status: DC
  Administered 2016-06-28: 15 [IU] via SUBCUTANEOUS
  Administered 2016-06-29: 8 [IU] via SUBCUTANEOUS
  Administered 2016-06-29: 15 [IU] via SUBCUTANEOUS
  Administered 2016-06-29: 3 [IU] via SUBCUTANEOUS
  Administered 2016-06-30 – 2016-07-01 (×3): 8 [IU] via SUBCUTANEOUS
  Administered 2016-07-01: 5 [IU] via SUBCUTANEOUS
  Administered 2016-07-02: 8 [IU] via SUBCUTANEOUS
  Administered 2016-07-02: 2 [IU] via SUBCUTANEOUS
  Administered 2016-07-02 – 2016-07-03 (×3): 3 [IU] via SUBCUTANEOUS

## 2016-06-28 MED ORDER — BISACODYL 5 MG PO TBEC
10.0000 mg | DELAYED_RELEASE_TABLET | Freq: Two times a day (BID) | ORAL | Status: AC
  Administered 2016-06-28 – 2016-06-29 (×2): 10 mg via ORAL
  Filled 2016-06-28 (×3): qty 2

## 2016-06-28 MED ORDER — POLYETHYLENE GLYCOL 3350 17 G PO PACK
34.0000 g | PACK | Freq: Once | ORAL | Status: AC
  Administered 2016-06-28: 34 g via ORAL
  Filled 2016-06-28: qty 2

## 2016-06-28 MED ORDER — INSULIN ASPART 100 UNIT/ML ~~LOC~~ SOLN
0.0000 [IU] | Freq: Three times a day (TID) | SUBCUTANEOUS | Status: DC
  Administered 2016-06-28: 7 [IU] via SUBCUTANEOUS
  Administered 2016-06-28: 1 [IU] via SUBCUTANEOUS

## 2016-06-28 MED ORDER — LORAZEPAM 1 MG PO TABS
1.0000 mg | ORAL_TABLET | Freq: Once | ORAL | Status: AC
  Administered 2016-06-28: 1 mg via ORAL
  Filled 2016-06-28: qty 1

## 2016-06-28 MED ORDER — INSULIN GLARGINE 100 UNIT/ML ~~LOC~~ SOLN
60.0000 [IU] | Freq: Two times a day (BID) | SUBCUTANEOUS | Status: DC
  Administered 2016-06-28: 60 [IU] via SUBCUTANEOUS
  Filled 2016-06-28 (×2): qty 0.6

## 2016-06-28 NOTE — Progress Notes (Signed)
Patient ID: Gabriel Alvarez, male   DOB: 09-10-1950, 66 y.o.   MRN: [id number]   ADVANCED HF ROUNDING NOTE  SUBJECTIVE:   Underwent RHC on 1/5. 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  Continues to diurese well on IV lasix. Out 4.3L overnight again. Weight down only 4 pounds though - 17 pounds total. Drinking a lot of fluid.  UNNA boots placed yesterday.Breathing better. Creatinine 2.9 -> 2.7-> 3.1. SBP 130-140s    RHC 1/5 RA mean 12 RV 73/16 PA 80/33, mean 50 PCWP mean 18 Cardiac Output (Fick) 7.31  Cardiac Index (Fick) 2.89 PVR: 4.4 WU    Scheduled Meds: . aspirin  81 mg Oral Daily  . bisacodyl  10 mg Rectal Once  . budesonide (PULMICORT) nebulizer solution  0.5 mg Nebulization BID  . carvedilol  6.25 mg Oral BID WC  . clopidogrel  75 mg Oral Daily  . furosemide  80 mg Intravenous Q12H  . heparin  5,000 Units Subcutaneous Q8H  . hydrALAZINE  50 mg Oral Q8H  . insulin aspart  0-9 Units Subcutaneous TID WC & HS  . insulin aspart  8 Units Subcutaneous TID WC  . insulin glargine  95 Units Subcutaneous QHS  . ipratropium-albuterol  3 mL Nebulization Q6H  . isosorbide mononitrate  60 mg Oral Daily  . predniSONE  40 mg Oral Q breakfast  . simvastatin  20 mg Oral QHS   Continuous Infusions: PRN Meds:.acetaminophen **OR** acetaminophen, acetaminophen, albuterol, hydrALAZINE, ondansetron **OR** ondansetron (ZOFRAN) IV, ondansetron (ZOFRAN) IV    Vitals:   06/28/16 0900 06/28/16 0951 06/28/16 1000 06/28/16 1100  BP:   (!) 148/72 137/72  Pulse:   93 81  Resp:   (!) 22 (!) 25  Temp:    97.5 F (36.4 C)  TempSrc:    Oral  SpO2:  94% 94% 91%  Weight: (!) 137 kg (302 lb 0.5 oz)     Height:        Intake/Output Summary (Last 24 hours) at 06/28/16 1146 Last data filed at 06/28/16 1000  Gross per 24 hour  Intake              480 ml  Output             4275 ml  Net            -3795 ml    LABS: Basic Metabolic Panel:  Recent Labs  06/27/16 0219 06/28/16 0226  NA 137 137  K 5.4*  4.2  CL 102 100*  CO2 26 25  GLUCOSE 192* 209*  BUN 55* 59*  CREATININE 2.76* 3.14*  CALCIUM 8.0* 8.2*   Liver Function Tests: No results for input(s): AST, ALT, ALKPHOS, BILITOT, PROT, ALBUMIN in the last 72 hours. No results for input(s): LIPASE, AMYLASE in the last 72 hours. CBC:  Recent Labs  06/26/16 0325  WBC 11.4*  HGB 14.1  HCT 44.1  MCV 81.5  PLT 198   Cardiac Enzymes: No results for input(s): CKTOTAL, CKMB, CKMBINDEX, TROPONINI in the last 72 hours. BNP: Invalid input(s): POCBNP D-Dimer: No results for input(s): DDIMER in the last 72 hours. Hemoglobin A1C: No results for input(s): HGBA1C in the last 72 hours. Fasting Lipid Panel: No results for input(s): CHOL, HDL, LDLCALC, TRIG, CHOLHDL, LDLDIRECT in the last 72 hours. Thyroid Function Tests: No results for input(s): TSH, T4TOTAL, T3FREE, THYROIDAB in the last 72 hours.  Invalid input(s): FREET3 Anemia Panel: No results for input(s): VITAMINB12, FOLATE, FERRITIN, TIBC, IRON, RETICCTPCT  in the last 72 hours.  RADIOLOGY: Dg Chest Portable 1 View  Result Date: 06/26/2016 CLINICAL DATA:  Respiratory failure. EXAM: PORTABLE CHEST 1 VIEW COMPARISON:  06/23/2016.  CT 01/01/ 2018. FINDINGS: Persistent mediastinal fullness noted consistent with previously identified adenopathy. Cardiomegaly with diffuse bilateral from interstitial prominence and bilateral pleural effusions consistent congestive heart failure. Progressive bibasilar atelectasis. No pneumothorax . IMPRESSION: 1. Persistent mediastinal fullness consistent previously identified adenopathy. 2. Cardiomegaly with diffuse bilateral pulmonary interstitial prominence and small bilateral pleural effusions consistent with congestive heart failure. 3.  Progressive bibasilar atelectasis . Electronically Signed   By: Marcello Moores  Register   On: 06/26/2016 06:49   Dg Chest Port 1 View  Result Date: 06/24/2016 CLINICAL DATA:  Shortness of breath.  History of diabetes, CHF.  EXAM: PORTABLE CHEST 1 VIEW COMPARISON:  CT chest June 22, 2016 FINDINGS: Cardiac silhouette is moderately enlarged, unchanged. Mediastinal silhouette is nonsuspicious. Diffusely coarsened pulmonary interstitium. Increased lung volumes. No pleural effusion or focal consolidation. No pneumothorax. Soft tissue planes and included osseous structures are nonsuspicious. IMPRESSION: Stable cardiomegaly. Probable COPD, in addition to diffuse coarsened pulmonary interstitium better characterized on yesterday's CT chest. Electronically Signed   By: Elon Alas M.D.   On: 06/24/2016 02:19    PHYSICAL EXAM General: NAD, obese. Sitting in chair NAD.  Neck: Thick, JVP difficult but suspect elevated, no thyromegaly or thyroid nodule.  Lungs: Distant breath sounds.  CV: Nondisplaced PMI.  Heart regular S1/S2, no S3/S4, no murmur.  2+ edema to knees.  + UNNA boots Abdomen: markedly obese.Soft, nontender, + distention.  Neurologic: Alert and oriented x 3.  Psych: Normal affect. Extremities: No clubbing or cyanosis.   TELEMETRY: Reviewed telemetry pt in NSR  ASSESSMENT AND PLAN: 66 yo with history of CKD stage III, DM, HTN, CAD presented to [facility] with acute CHF.  Echo was done showing EF 15-20%, he was hypoxic requiring Bipap.  Creatinine elevated, ?chronicity as no recent prior BMET.  Transferred to [facility] for management.  1. Acute systolic CHF: Uncertain chronicity of low EF.  Echo (1/18) at [facility] with EF 15-20%, moderate LVH.  Last echo in 2013 had normal EF.  History of CAD, so possible ischemic cardiomyopathy.  Also may have sarcoid based on CT chest at [facility].  Currently, I suspect that he is volume overloaded (though exam difficult) but management complicated by elevated creatinine (stable today).  BP is stable.  He remains on oxygen by nasal cannula.  - RHC 06/26/16 with moderate to severe PAH (likely group 3). PCWP 18 and normal cardiac output -Volume status and renal function improving with  Lasix 80 mg IV bid + metolazone 2.5 mg po daily but renal function starting to bump again. Still with 2+ edema into thighs due to R>>L HF. Will continue IV diuresis one more day (hold metolazone) and likely switch to torsemide in am. Watch renal function closely. Counseled on need to limit fluid intake but insight into HF seems quite poor. - Continue UNNA boots - Continue hydralazine 50 mg tid, Imdur 60 daily.  - Continue low dose Coreg.  - No ACEI/ARB/ARNI/spironolactone with elevated creatinine.  - Will need eventual ischemic workup but creatinine currently prohibitive in absence of ACS.  2. CAD: History of remote mid RCA stent.  Last cath in 2005 with 50% in-stent restenosis in RCA.  Mild stable troponin elevation likely demand ischemia/volume overload.  - Continue ASA (can decrease to 81 daily), Plavix, statin.  - Will need eventual ischemic workup but creatinine currently prohibitive  in absence of ACS.  3. ?AKI on CKD stage III: Most recent prior creatinine was 1.48 in 2014, so not sure of chronicity of elevated creatinine (acute versus chronic).  Will have to watch closely with diuresis.  Lowering of renal venous pressure with diuresis may help renal function. Creatinine was down but bumped a bit today.  4. Abnormal chest CT: Concern for sarcoidosis.  He has been started on prednisone.  Pulmonary following.  ACE level is not elevated.  5. DM II: Per primary service.  6. Gout: Stable.  7. OSA/OHS: Suspected.  He will use Bipap at night and oxygen during the day.  8. Hyperkalemia: Resolved 9. Morbid obesity - needs weight loss.  Glori Bickers MD 06/28/2016 11:46 AM

## 2016-06-28 NOTE — Progress Notes (Signed)
PROGRESS NOTE                                                                                                                                                                                                             Patient Demographics:    Gabriel Alvarez, is a 66 y.o. male, DOB - 1950-08-08, IBB:[id number]  Admit date - 06/23/2016   Admitting Physician Waldemar Dickens, MD  Outpatient Primary MD for the patient is Manon Hilding, MD  LOS - 5  No chief complaint on file.      Brief Narrative  Gabriel Alvarez is a 66 y.o. male with history COPD, ongoing tobacco abuse, diabetes mellitus type 2 and CAD status post stenting as per the patient (I don't have any records of this) presented to the ER at [facility] with complaints of shortness of breath on 06/21/2016, Echo showed EF 15% and he was tx to [facility] 1/2 for cardiology eval.  CT chest showed ground glass opacities with some question for sarcoid v edema, Transferred to [facility] 1/3 for further workup.  Subjective:    Gabriel Alvarez today has, No headache, No chest pain, No abdominal pain - No Nausea, Report generalized weakness, denies any cough,Denies any dyspnea at rest(he is currently on 25 L-on nasal cannula!)..    Assessment  & Plan :    Principal Problem:   Acute respiratory failure with hypoxia (HCC) Active Problems:   Coronary artery disease involving native coronary artery of native heart without angina pectoris   Diabetes mellitus type 2 with peripheral artery disease (HCC)   Acute systolic CHF (congestive heart failure) (HCC)   Acute renal failure superimposed on stage 4 chronic kidney disease (HCC)   Hypertensive urgency   Acute CHF (congestive heart failure) (HCC)   SOB (shortness of breath)   Cardiomyopathy (HCC)   Acute respiratory failure with hypoxia  - multifactorial primarily acute systolic/Diastolic heart failure IH03-88% with grade 2 diastolic dysfunction, seen by  cardiology, on diuresis. 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 - Management  per cardiology , monitor ins and outs closely so far -12.1 L since admission , and acute on IV Lasix, metolazone is on hold given worsening renal function, plan to transition to by mouth torsemide 10 AM per cards - Continue with Coreg, hydralazine/Imdur, - No ACEI/ARB/ARNI/spironolactone with elevated creatinine. - RHC 1/5 significant for mildly elevated right and left heart filling pressure, and severe pulmonary arterial hypertension.  Severe pulmonary arterial hypertension - Per cards this is most likely group 3 pulmonary hypertension, most likely due to OHS/OSA, will encourage to use BiPAP at night, continue with oxygen, will need VQ scan when more stable.  Possible sarcoidosis - Pulmonary consulted, on prednisone, and Pulmicort and nebs - ESR unremarkable, RFnegative, CRP negative, ACE negative - Plan to repeat CT chest when appropriately diuresed  Acute on chronic kidney disease stage 3-4  - given the significant proteinuria and low albumin patient probably has nephrotic syndrome from diabetes. At this time patient is on Lasix. Holding off ACE inhibitor due to worsening renal function, creatinine slight increase today on diuresis, check BMP in a.m.Marland Kitchen - Most likely cardiorenal syndrome contributing to his renal failure, called his PCP office, most recent lab was in 2014 with a creatinine of 1.48.  Hypertension - With elevated blood pressure on admission, currently on Coreg, hydralazine, Imdur  Diabetes mellitus type 2  - last hemoglobin A1c 2 days before admission was 7.4. Patient's blood sugar is  elevated due to patient being on steroids.  - Labile, but generally uncontrolled, currently with poor control and 95 units daily, will change to 60 units twice a daycontinue with insulin sliding scale, and  8 units NovoLog before meals  CAD - Continue with aspirin, Plavix and statin - Elevated troponins non-ACS pattern per cardiology. - Will need ischemic workup for more stable  History of gout on allopurinol.  -  on allopurinol , currently on hold  Code Status : Full  Family Communication  : wife at bedside   Disposition Plan  : pending further work up.  Consults  :  Cardiology, PCCM  Procedures  : None  DVT Prophylaxis  :   Heparin - SCDs   Lab Results  Component Value Date   PLT 198 06/26/2016    Antibiotics  :   Anti-infectives    None        Objective:   Vitals:   06/28/16 0900 06/28/16 0951 06/28/16 1000 06/28/16 1100  BP:   (!) 148/72 137/72  Pulse:   93 81  Resp:   (!) 22 (!) 25  Temp:    97.5 F (36.4 C)  TempSrc:    Oral  SpO2:  94% 94% 91%  Weight: (!) 137 kg (302 lb 0.5 oz)     Height:        Wt Readings from Last 3 Encounters:  06/28/16 (!) 137 kg (302 lb 0.5 oz)  06/05/16 (!) 141.1 kg (311 lb)  06/05/16 (!) 141.1 kg (311 lb)     Intake/Output Summary (Last 24 hours) at 06/28/16 1217 Last data filed at 06/28/16 1000  Gross per 24 hour  Intake              480 ml  Output             4275 ml  Net            -3795 ml     Physical Exam  Awake Alert, Oriented X 3, No new F.N deficits, Normal affect Supple Neck,No JVD,  Symmetrical Chest wall movement, Good air movement bilaterally,Bibasilar Rales RRR,No Gallops,Rubs or new Murmurs, No Parasternal Heave +ve B.Sounds, Abd Soft, No tenderness, , No rebound - guarding or rigidity. No Cyanosis, Clubbing , No new Rash or bruise  , significant anasarca    Data Review:    CBC  Recent Labs Lab 06/23/16 2326 06/24/16 0739 06/25/16 0316 06/26/16 0325  WBC 15.0* 16.2* 13.1* 11.4*  HGB  14.3 14.5 14.1 14.1  HCT 43.5 44.6 43.8 44.1  PLT 178 232 241 198  MCV 81.0 80.1 80.7 81.5  MCH 26.6 26.0 26.0 26.1  MCHC 32.9 32.5 32.2 32.0  RDW 17.8* 17.2* 17.3* 17.2*  LYMPHSABS 1.1  --   --   --   MONOABS 0.8  --   --   --   EOSABS 0.0  --   --   --   BASOSABS 0.0  --   --   --     Chemistries   Recent Labs Lab 06/23/16 2326 06/24/16 0739 06/25/16 0316 06/26/16 0325 06/27/16 0219 06/28/16 0226  NA 139 141 141 139 137 137  K 4.7 4.8 4.4 4.3 5.4* 4.2  CL 105 110 109 104 102 100*  CO2 20* _0 GLUCOSE 281* 176* 132* 199* 192* 209*  BUN 42* 42* 49* 49* 55* 59*  CREATININE 3.07* 2.85* 2.96* 2.91* 2.76* 3.14*  CALCIUM 8.0* 7.9* 7.9* 7.9* 8.0* 8.2*  MG  --  1.8  --   --   --   --   AST 32  --   --   --   --   --   ALT 26  --   --   --   --   --   ALKPHOS 72  --   --   --   --   --   BILITOT 0.4  --   --   --   --   --    ------------------------------------------------------------------------------------------------------------------ No results for input(s): CHOL, HDL, LDLCALC, TRIG, CHOLHDL, LDLDIRECT in the last 72 hours.  Lab Results  Component Value Date   HGBA1C 7.5 06/05/2016   ------------------------------------------------------------------------------------------------------------------ No results for input(s): TSH, T4TOTAL, T3FREE, THYROIDAB in the last 72 hours.  Invalid input(s): FREET3 ------------------------------------------------------------------------------------------------------------------ No results for input(s): VITAMINB12, FOLATE, FERRITIN, TIBC, IRON, RETICCTPCT in the last 72 hours.  Coagulation profile No results for input(s): INR, PROTIME in the last 168 hours.  No results for input(s): DDIMER in the last 72 hours.  Cardiac Enzymes  Recent Labs Lab 06/23/16 2326 06/24/16 0739 06/24/16 1149  TROPONINI 0.13* 0.28* 0.29*    ------------------------------------------------------------------------------------------------------------------    Component Value Date/Time   BNP 355.0 (H) 06/23/2016 2326    Inpatient Medications  Scheduled Meds: . aspirin  81 mg Oral Daily  . bisacodyl  10 mg Rectal Once  . budesonide (PULMICORT) nebulizer solution  0.5 mg Nebulization BID  . carvedilol  6.25 mg Oral BID WC  . clopidogrel  75 mg Oral Daily  . furosemide  80 mg Intravenous Q12H  . heparin  5,000 Units Subcutaneous Q8H  . hydrALAZINE  50 mg Oral Q8H  . insulin aspart  0-9 Units Subcutaneous TID WC & HS  . insulin aspart  8 Units Subcutaneous TID WC  . insulin glargine  60 Units Subcutaneous BID  . ipratropium-albuterol  3 mL Nebulization Q6H  . isosorbide mononitrate  60 mg Oral Daily  . predniSONE  40 mg Oral Q breakfast  . simvastatin  20 mg  Oral QHS   Continuous Infusions: PRN Meds:.acetaminophen **OR** acetaminophen, acetaminophen, albuterol, hydrALAZINE, ondansetron **OR** ondansetron (ZOFRAN) IV, ondansetron (ZOFRAN) IV  Micro Results Recent Results (from the past 240 hour(s))  MRSA PCR Screening     Status: None   Collection Time: 06/23/16  9:34 PM  Result Value Ref Range Status   MRSA by PCR NEGATIVE NEGATIVE Final    Comment:        The GeneXpert MRSA Assay (FDA approved for NASAL specimens only), is one component of a comprehensive MRSA colonization surveillance program. It is not intended to diagnose MRSA infection nor to guide or monitor treatment for MRSA infections.     Radiology Reports Dg Chest Portable 1 View  Result Date: 06/26/2016 CLINICAL DATA:  Respiratory failure. EXAM: PORTABLE CHEST 1 VIEW COMPARISON:  06/23/2016.  CT 01/01/ 2018. FINDINGS: Persistent mediastinal fullness noted consistent with previously identified adenopathy. Cardiomegaly with diffuse bilateral from interstitial prominence and bilateral pleural effusions consistent congestive heart failure.  Progressive bibasilar atelectasis. No pneumothorax . IMPRESSION: 1. Persistent mediastinal fullness consistent previously identified adenopathy. 2. Cardiomegaly with diffuse bilateral pulmonary interstitial prominence and small bilateral pleural effusions consistent with congestive heart failure. 3.  Progressive bibasilar atelectasis . Electronically Signed   By: Marcello Moores  Register   On: 06/26/2016 06:49   Dg Chest Port 1 View  Result Date: 06/24/2016 CLINICAL DATA:  Shortness of breath.  History of diabetes, CHF. EXAM: PORTABLE CHEST 1 VIEW COMPARISON:  CT chest June 22, 2016 FINDINGS: Cardiac silhouette is moderately enlarged, unchanged. Mediastinal silhouette is nonsuspicious. Diffusely coarsened pulmonary interstitium. Increased lung volumes. No pleural effusion or focal consolidation. No pneumothorax. Soft tissue planes and included osseous structures are nonsuspicious. IMPRESSION: Stable cardiomegaly. Probable COPD, in addition to diffuse coarsened pulmonary interstitium better characterized on yesterday's CT chest. Electronically Signed   By: Elon Alas M.D.   On: 06/24/2016 02:19     Cloyce Paterson M.D on 06/28/2016 at 12:17 PM  Between 7am to 7pm - Pager - [phone]  After 7pm go to [email or web address] - password [facility]  [facility] -  Office  [phone]

## 2016-06-28 NOTE — Progress Notes (Signed)
PULMONARY / CRITICAL CARE MEDICINE   Name: Gabriel Alvarez MRN: [id number] DOB: 1950/07/14    ADMISSION DATE:  06/23/2016 CONSULTATION DATE:  45/4  REFERRING MD:  Elgergawy ([facility])   CHIEF COMPLAINT:  Respiratory failure  HISTORY OF PRESENT ILLNESS:   66yo male smoker with hx DM, CAD, chronic combined CHF, CKD IV, HTN initially presented to [facility] hospital with SOB, weight gain of 80lbs in last 30 days.  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  Echo showed EF 15% and he was tx to [facility] 1/2 for cardiology eval.  CT chest showed ground glass opacities with some question for sarcoid v edema.  He has continued to require bipap despite diuresis and PCCM consulted.   Currently c/o mild dyspnea although improved, mostly with exertion.  Also c/o orthopnea, weight gain, BLE edema.  Denies PND, chest pain, hemoptysis.  DOES have family hx of sarcoid -- his daughter died last year at age 73 "of sarcoid".  No other autoimmune family hx.  Current smoker - abt 1/2ppd.  40 pack years.    SUBJECTIVE:  Remains on HFNC. Dyspnea improved. He has not ambulated beyond transferring from chair to bed. Denies dyspnea on those transfers.  VITAL SIGNS: BP (!) 156/74   Pulse 80   Temp 97.6 F (36.4 C) (Oral)   Resp (!) 28   Ht 5' 11" (1.803 m)   Wt (!) 302 lb 0.5 oz (137 kg)   SpO2 97%   BMI 42.12 kg/m   INTAKE / OUTPUT: I/O last 3 completed shifts: In: 480 [P.O.:480] Out: 6375 [Urine:6375]  PHYSICAL EXAMINATION: General:  Pleasant, obese male, NAD Neuro:  Awake, alert, appropriate, MAE  HEENT:  Mm moist Cardiovascular:  s1s2 distant  Lungs:  resps even nonlabored, diminished, scattered crackles  Abdomen:  Round, distended, non tender, +bs  Musculoskeletal:  Warm and dry, 2+ BLE edema  LABS:  BMET  Recent Labs Lab 06/26/16 0325 06/27/16 0219 06/28/16 0226  NA 139 137 137  K 4.3 5.4* 4.2  CL 104 102 100*  CO2 _0 BUN 49* 55* 59*  CREATININE 2.91* 2.76* 3.14*   GLUCOSE 199* 192* 209*    Electrolytes  Recent Labs Lab 06/24/16 0739  06/26/16 0325 06/27/16 0219 06/28/16 0226  CALCIUM 7.9*  < > 7.9* 8.0* 8.2*  MG 1.8  --   --   --   --   < > = values in this interval not displayed.  CBC  Recent Labs Lab 06/24/16 0739 06/25/16 0316 06/26/16 0325  WBC 16.2* 13.1* 11.4*  HGB 14.5 14.1 14.1  HCT 44.6 43.8 44.1  PLT 232 241 198    Sepsis Markers  Recent Labs Lab 06/25/16 1614 06/26/16 0325 06/27/16 0219  PROCALCITON 0.19 0.16 0.19    ABG  Recent Labs Lab 06/24/16 0130  PHART 7.351  PCO2ART 47.4  PO2ART 72.1*    Liver Enzymes  Recent Labs Lab 06/23/16 2326  AST 32  ALT 26  ALKPHOS 72  BILITOT 0.4  ALBUMIN 1.9*    Cardiac Enzymes  Recent Labs Lab 06/23/16 2326 06/24/16 0739 06/24/16 1149  TROPONINI 0.13* 0.28* 0.29*    Glucose  Recent Labs Lab 06/27/16 0820 06/27/16 1157 06/27/16 1559 06/27/16 2055 06/28/16 0213 06/28/16 0802  GLUCAP 124* 234* 311* 368* 230* 134*    Imaging No results found.   STUDIES:  Echo ([facility]) >>>Echocardiogram was done on 06/22/2016 and showed moderate concentric left ventricular hypertrophy with an LVEF  of 15-20%. Also with grade 2 diastolic dysfunction. He had normal biatrial size.Last echo in 2013 with normal LV function.  CT chest ([facility])>>> persistent mediastinal and hilar adenopathy unchanged from 2013. Interstitial prominence and patchy groundglass opacities overall findings favoring sarcoidosis could possibly be related to pulmonary edema and small right pleural effusion noted.  RHC 1/5 >>> mildly elevated right and left heart filling pressures, severe PAH  RA mean 12 RV 73/16 PA 80/33, mean 50 PCWP mean 18  Oxygen saturations: PA 65% AO 89%  Cardiac Output ([facility]) 7.31  Cardiac Index ([facility]) 2.89 PVR: 4.4 WU    CULTURES: Sputum culture  ANTIBIOTICS: None  SIGNIFICANT EVENTS: 1/3> transferred to  [facility]  LINES/TUBES: None  DISCUSSION: 66yo male with hx CKD, CAD now with acute on chronic CHF with EG 15% and acute hypoxic respiratory failure requiring bipap.   ASSESSMENT / PLAN: Acute hypoxic respiratory failure  Tobacco abuse  Presumed COPD Suspected OSA/OHS Abnormal chest CT -- does have strong family hx for sarcoid but hard to interpret current CT given significant volume overload. 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

## 2016-06-29 LAB — CBC
HEMATOCRIT: 45.5 % (ref 39.0–52.0)
HEMOGLOBIN: 15.5 g/dL (ref 13.0–17.0)
MCH: 27 pg (ref 26.0–34.0)
MCHC: 34.1 g/dL (ref 30.0–36.0)
MCV: 79.1 fL (ref 78.0–100.0)
Platelets: 218 10*3/uL (ref 150–400)
RBC: 5.75 MIL/uL (ref 4.22–5.81)
RDW: 16.8 % — ABNORMAL HIGH (ref 11.5–15.5)
WBC: 9.2 10*3/uL (ref 4.0–10.5)

## 2016-06-29 LAB — ANCA TITERS: C-ANCA: <1:20 {titer}

## 2016-06-29 LAB — GLUCOSE, CAPILLARY
Glucose-Capillary: 163 mg/dL — ABNORMAL HIGH (ref 65–99)
Glucose-Capillary: 285 mg/dL — ABNORMAL HIGH (ref 65–99)
Glucose-Capillary: 384 mg/dL — ABNORMAL HIGH (ref 65–99)
Glucose-Capillary: 343 mg/dL — ABNORMAL HIGH (ref 65–99)

## 2016-06-29 LAB — BASIC METABOLIC PANEL
ANION GAP: 11 (ref 5–15)
BUN: 67 mg/dL — AB (ref 6–20)
CHLORIDE: 101 mmol/L (ref 101–111)
CO2: 25 mmol/L (ref 22–32)
Calcium: 8.3 mg/dL — ABNORMAL LOW (ref 8.9–10.3)
Creatinine, Ser: 3.08 mg/dL — ABNORMAL HIGH (ref 0.61–1.24)
GFR calc Af Amer: 23 mL/min — ABNORMAL LOW (ref >60)
GFR calc non Af Amer: 20 mL/min — ABNORMAL LOW (ref >60)
GLUCOSE: 204 mg/dL — AB (ref 65–99)
POTASSIUM: 4.8 mmol/L (ref 3.5–5.1)
Sodium: 137 mmol/L (ref 135–145)

## 2016-06-29 MED ORDER — INSULIN GLARGINE 100 UNIT/ML ~~LOC~~ SOLN
60.0000 [IU] | Freq: Two times a day (BID) | SUBCUTANEOUS | Status: DC
  Administered 2016-06-29 – 2016-06-30 (×4): 60 [IU] via SUBCUTANEOUS
  Filled 2016-06-29 (×5): qty 0.6

## 2016-06-29 MED ORDER — ZOLPIDEM TARTRATE 5 MG PO TABS
2.5000 mg | ORAL_TABLET | Freq: Once | ORAL | Status: AC
  Administered 2016-06-29: 2.5 mg via ORAL
  Filled 2016-06-29: qty 1

## 2016-06-29 MED ORDER — TORSEMIDE 20 MG PO TABS
60.0000 mg | ORAL_TABLET | Freq: Every day | ORAL | Status: DC
  Administered 2016-06-29 – 2016-06-30 (×2): 60 mg via ORAL
  Filled 2016-06-29 (×2): qty 3

## 2016-06-29 MED ORDER — IPRATROPIUM-ALBUTEROL 0.5-2.5 (3) MG/3ML IN SOLN
3.0000 mL | Freq: Four times a day (QID) | RESPIRATORY_TRACT | Status: DC
  Administered 2016-06-29 – 2016-07-03 (×18): 3 mL via RESPIRATORY_TRACT
  Filled 2016-06-29 (×18): qty 3

## 2016-06-29 MED ORDER — ISOSORB DINITRATE-HYDRALAZINE 20-37.5 MG PO TABS
2.0000 | ORAL_TABLET | Freq: Three times a day (TID) | ORAL | Status: DC
  Administered 2016-06-29 – 2016-07-03 (×13): 2 via ORAL
  Filled 2016-06-29 (×13): qty 2

## 2016-06-29 NOTE — Progress Notes (Signed)
PROGRESS NOTE                                                                                                                                                                                                             Patient Demographics:    Gabriel Alvarez, is a 66 y.o. male, DOB - 1950/10/06, BSW:[id number]  Admit date - 06/23/2016   Admitting Physician Waldemar Dickens, MD  Outpatient Primary MD for the patient is Manon Hilding, MD  LOS - 6  No chief complaint on file.      Brief Narrative  Gabriel Alvarez is a 66 y.o. male with history COPD, ongoing tobacco abuse, diabetes mellitus type 2 and CAD status post stenting as per the patient (I don't have any records of this) presented to the ER at [facility] with complaints of shortness of breath on 06/21/2016, Echo showed EF 15% and he was tx to [facility] 1/2 for cardiology eval.  CT chest showed ground glass opacities with some question for sarcoid v edema, Transferred to [facility] 1/3 for further workup.  Subjective:    Gabriel Alvarez today has, No headache, No chest pain, No abdominal pain - No Nausea, Report generalized weakness, denies any cough,Denies any dyspnea at rest(he is currently on 25 L-on nasal cannula!)..    Assessment  & Plan :    Principal Problem:   Acute respiratory failure with hypoxia (HCC) Active Problems:   Coronary artery disease involving native coronary artery of native heart without angina pectoris   Diabetes mellitus type 2 with peripheral artery disease (HCC)   Acute systolic CHF (congestive heart failure) (HCC)   Acute renal failure superimposed on stage 4 chronic kidney disease (HCC)   Hypertensive urgency   Acute CHF (congestive heart failure) (HCC)   SOB (shortness of breath)   Cardiomyopathy (HCC)   Acute respiratory failure with hypoxia  - multifactorial primarily acute systolic/Diastolic heart failure GY65-99% with grade 2 diastolic dysfunction, seen by  cardiology, on diuresis. - As well secondary to sarcoidosis(most likely), seen by pulmonary, on steroids - Very likely OSA plus OHS contributing, on BiPAP daily at bedtime - Initially on continuous BiPAP, currently on high flow nasal cannula, weaned to 60%, today, will continue to wean as tolerated, will assess oxygen demand and ambulation, PT consult requested.  Acute systolic / diastolic CHF -  Patient presents with anasarca, EF 15-20% with a grade 2 diastolic dysfunction. - Management per cardiology , monitor ins and outs closely so far -17 L since admission , initially on IV Lasix, metolazone transitioned to torsemide today. - Continue with Coreg, and BiDil - No ACEI/ARB/ARNI/spironolactone with elevated creatinine. - RHC 1/5 significant for mildly elevated right and left heart filling pressure, and severe pulmonary arterial hypertension.  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  Possible sarcoidosis - Pulmonary consulted, on prednisone, and Pulmicort and nebs - ESR unremarkable, RFnegative, CRP negative, ACE negative  - Plan to repeat CT chest when appropriately diuresed  Acute on chronic kidney disease stage 3-4  - given the significant proteinuria and low albumin patient probably has nephrotic syndrome from diabetes. At this time patient is on Lasix. Holding off ACE inhibitor due to worsening renal function,  - Most likely cardiorenal syndrome contributing to his renal failure, called his PCP office, most recent lab was in 2014 with a creatinine of 1.48.  Hypertension - With elevated blood pressure on admission, currently on Coreg AND BIDIL  Diabetes mellitus type 2  - last hemoglobin A1c 2 days before admission was 7.4. Patient's blood sugar is elevated due to patient  being on steroids.  - Labile, but generally uncontrolled, change to 60 units twice a day, nausea requiring almost 100 units daily and CBG still uncontrolled, continue with insulin sliding-scale, and pre-meal insulin.  CAD - Continue with aspirin, Plavix and statin - Elevated troponins non-ACS pattern per cardiology. - Will need ischemic workup for more stable  History of gout on allopurinol.  -  on allopurinol , currently on hold  Code Status : Full  Family Communication  : wife at bedside   Disposition Plan  : pending further work up, will consult PT today has he appears to be stable for evaluation  Consults  :  Cardiology, PCCM  Procedures  : None  DVT Prophylaxis  :   Heparin - SCDs   Lab Results  Component Value Date   PLT 218 06/29/2016    Antibiotics  :   Anti-infectives    None        Objective:   Vitals:   06/29/16 0827 06/29/16 0829 06/29/16 0836 06/29/16 1100  BP:    108/62  Pulse:    92  Resp:    (!) 24  Temp:    98.6 F (37 C)  TempSrc:    Oral  SpO2: (!) 89% 92% 91% (!) 83%  Weight:      Height:        Wt Readings from Last 3 Encounters:  06/29/16 135 kg (297 lb 9.6 oz)  06/05/16 (!) 141.1 kg (311 lb)  06/05/16 (!) 141.1 kg (311 lb)     Intake/Output Summary (Last 24 hours) at 06/29/16 1205 Last data filed at 06/29/16 0900  Gross per 24 hour  Intake              460 ml  Output             5400 ml  Net            -4940 ml     Physical Exam  Awake Alert, Oriented X 3, No new F.N deficits, Normal affect Supple Neck,No JVD, Symmetrical Chest wall  movement, Good air movement bilaterally,Bibasilar Rales RRR,No Gallops,Rubs or new Murmurs, No Parasternal Heave +ve B.Sounds, Abd Soft, No tenderness, , No rebound - guarding or rigidity. No Cyanosis, Clubbing , No new Rash or bruise  , significant anasarca    Data Review:    CBC  Recent Labs Lab 06/23/16 2326 06/24/16 0739 06/25/16 0316 06/26/16 0325 06/29/16 0335  WBC 15.0*  16.2* 13.1* 11.4* 9.2  HGB 14.3 14.5 14.1 14.1 15.5  HCT 43.5 44.6 43.8 44.1 45.5  PLT 178 232 241 198 218  MCV 81.0 80.1 80.7 81.5 79.1  MCH 26.6 26.0 26.0 26.1 27.0  MCHC 32.9 32.5 32.2 32.0 34.1  RDW 17.8* 17.2* 17.3* 17.2* 16.8*  LYMPHSABS 1.1  --   --   --   --   MONOABS 0.8  --   --   --   --   EOSABS 0.0  --   --   --   --   BASOSABS 0.0  --   --   --   --     Chemistries   Recent Labs Lab 06/23/16 2326 06/24/16 0739 06/25/16 0316 06/26/16 0325 06/27/16 0219 06/28/16 0226 06/29/16 0335  NA 139 141 141 139 137 137 137  K 4.7 4.8 4.4 4.3 5.4* 4.2 4.8  CL 105 110 109 104 102 100* 101  CO2 20* _0 GLUCOSE 281* 176* 132* 199* 192* 209* 204*  BUN 42* 42* 49* 49* 55* 59* 67*  CREATININE 3.07* 2.85* 2.96* 2.91* 2.76* 3.14* 3.08*  CALCIUM 8.0* 7.9* 7.9* 7.9* 8.0* 8.2* 8.3*  MG  --  1.8  --   --   --   --   --   AST 32  --   --   --   --   --   --   ALT 26  --   --   --   --   --   --   ALKPHOS 72  --   --   --   --   --   --   BILITOT 0.4  --   --   --   --   --   --    ------------------------------------------------------------------------------------------------------------------ No results for input(s): CHOL, HDL, LDLCALC, TRIG, CHOLHDL, LDLDIRECT in the last 72 hours.  Lab Results  Component Value Date   HGBA1C 7.5 06/05/2016   ------------------------------------------------------------------------------------------------------------------ No results for input(s): TSH, T4TOTAL, T3FREE, THYROIDAB in the last 72 hours.  Invalid input(s): FREET3 ------------------------------------------------------------------------------------------------------------------ No results for input(s): VITAMINB12, FOLATE, FERRITIN, TIBC, IRON, RETICCTPCT in the last 72 hours.  Coagulation profile No results for input(s): INR, PROTIME in the last 168 hours.  No results for input(s): DDIMER in the last 72 hours.  Cardiac Enzymes  Recent Labs Lab 06/23/16 2326  06/24/16 0739 06/24/16 1149  TROPONINI 0.13* 0.28* 0.29*   ------------------------------------------------------------------------------------------------------------------    Component Value Date/Time   BNP 355.0 (H) 06/23/2016 2326    Inpatient Medications  Scheduled Meds: . aspirin  81 mg Oral Daily  . bisacodyl  10 mg Oral BID  . bisacodyl  10 mg Rectal Once  . budesonide (PULMICORT) nebulizer solution  0.5 mg Nebulization BID  . carvedilol  6.25 mg Oral BID WC  . clopidogrel  75 mg Oral Daily  . heparin  5,000 Units Subcutaneous Q8H  . insulin aspart  0-15 Units Subcutaneous TID WC  . insulin aspart  0-5 Units Subcutaneous QHS  . insulin aspart  8 Units Subcutaneous  TID WC  . insulin glargine  60 Units Subcutaneous BID  . ipratropium-albuterol  3 mL Nebulization QID  . isosorbide-hydrALAZINE  2 tablet Oral TID  . predniSONE  40 mg Oral Q breakfast  . simvastatin  20 mg Oral QHS  . torsemide  60 mg Oral Daily   Continuous Infusions: PRN Meds:.acetaminophen **OR** acetaminophen, acetaminophen, albuterol, hydrALAZINE, ondansetron **OR** ondansetron (ZOFRAN) IV, ondansetron (ZOFRAN) IV  Micro Results Recent Results (from the past 240 hour(s))  MRSA PCR Screening     Status: None   Collection Time: 06/23/16  9:34 PM  Result Value Ref Range Status   MRSA by PCR NEGATIVE NEGATIVE Final    Comment:        The GeneXpert MRSA Assay (FDA approved for NASAL specimens only), is one component of a comprehensive MRSA colonization surveillance program. It is not intended to diagnose MRSA infection nor to guide or monitor treatment for MRSA infections.     Radiology Reports Dg Chest Portable 1 View  Result Date: 06/26/2016 CLINICAL DATA:  Respiratory failure. EXAM: PORTABLE CHEST 1 VIEW COMPARISON:  06/23/2016.  CT 01/01/ 2018. FINDINGS: Persistent mediastinal fullness noted consistent with previously identified adenopathy. Cardiomegaly with diffuse bilateral from  interstitial prominence and bilateral pleural effusions consistent congestive heart failure. Progressive bibasilar atelectasis. No pneumothorax . IMPRESSION: 1. Persistent mediastinal fullness consistent previously identified adenopathy. 2. Cardiomegaly with diffuse bilateral pulmonary interstitial prominence and small bilateral pleural effusions consistent with congestive heart failure. 3.  Progressive bibasilar atelectasis . Electronically Signed   By: Marcello Moores  Register   On: 06/26/2016 06:49   Dg Chest Port 1 View  Result Date: 06/24/2016 CLINICAL DATA:  Shortness of breath.  History of diabetes, CHF. EXAM: PORTABLE CHEST 1 VIEW COMPARISON:  CT chest June 22, 2016 FINDINGS: Cardiac silhouette is moderately enlarged, unchanged. Mediastinal silhouette is nonsuspicious. Diffusely coarsened pulmonary interstitium. Increased lung volumes. No pleural effusion or focal consolidation. No pneumothorax. Soft tissue planes and included osseous structures are nonsuspicious. IMPRESSION: Stable cardiomegaly. Probable COPD, in addition to diffuse coarsened pulmonary interstitium better characterized on yesterday's CT chest. Electronically Signed   By: Elon Alas M.D.   On: 06/24/2016 02:19     Gabriel Alvarez, DAWOOD M.D on 06/29/2016 at 12:05 PM  Between 7am to 7pm - Pager - [phone]  After 7pm go to [email or web address] - password [email or web address]  [facility] -  Office  [phone]

## 2016-06-29 NOTE — Progress Notes (Signed)
Patient changed from heated high flow to salter high flow nasal cannula at liter flow of 12.  Patient sats currently 90%.  No distress noted.  Will continue to monitor.

## 2016-06-29 NOTE — Progress Notes (Signed)
Patient ID: Gabriel Alvarez, male   DOB: [birth year], 66 y.o.   MRN: [id number]   ADVANCED HF ROUNDING NOTE  SUBJECTIVE:   Underwent RHC on 1/5. 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  Continues to diurese well on IV lasix. Weight down again.  Wearing UNNA boots. Creatinine 2.9 -> 2.7-> 3.1 -> 3.08. SBP 130-140s. Still on high flow oxygen.   RHC 1/5 RA mean 12 RV 73/16 PA 80/33, mean 50 PCWP mean 18 Cardiac Output (Fick) 7.31  Cardiac Index (Fick) 2.89 PVR: 4.4 WU   Scheduled Meds: . aspirin  81 mg Oral Daily  . bisacodyl  10 mg Oral BID  . bisacodyl  10 mg Rectal Once  . budesonide (PULMICORT) nebulizer solution  0.5 mg Nebulization BID  . carvedilol  6.25 mg Oral BID WC  . clopidogrel  75 mg Oral Daily  . heparin  5,000 Units Subcutaneous Q8H  . insulin aspart  0-15 Units Subcutaneous TID WC  . insulin aspart  0-5 Units Subcutaneous QHS  . insulin aspart  8 Units Subcutaneous TID WC  . insulin glargine  60 Units Subcutaneous BID  . ipratropium-albuterol  3 mL Nebulization QID  . isosorbide-hydrALAZINE  2 tablet Oral TID  . predniSONE  40 mg Oral Q breakfast  . simvastatin  20 mg Oral QHS  . torsemide  60 mg Oral Daily   Continuous Infusions: PRN Meds:.acetaminophen **OR** acetaminophen, acetaminophen, albuterol, hydrALAZINE, ondansetron **OR** ondansetron (ZOFRAN) IV, ondansetron (ZOFRAN) IV    Vitals:   06/29/16 0400 06/29/16 0500 06/29/16 0621 06/29/16 0713  BP: (!) 141/72  (!) 151/67 (!) 143/75  Pulse: 86   81  Resp: (!) 28   19  Temp:    97.7 F (36.5 C)  TempSrc:    Oral  SpO2: 94% 90%  (!) 86%  Weight:  297 lb 9.6 oz (135 kg)    Height:        Intake/Output Summary (Last 24 hours) at 06/29/16 0815 Last data filed at 06/29/16 4098  Gross per 24 hour  Intake              960 ml  Output             5400 ml  Net            -4440 ml    LABS: Basic Metabolic Panel:  Recent Labs  06/28/16 0226 06/29/16 0335  NA 137 137  K 4.2 4.8  CL 100* 101  CO2 25 25    GLUCOSE 209* 204*  BUN 59* 67*  CREATININE 3.14* 3.08*  CALCIUM 8.2* 8.3*   Liver Function Tests: No results for input(s): AST, ALT, ALKPHOS, BILITOT, PROT, ALBUMIN in the last 72 hours. No results for input(s): LIPASE, AMYLASE in the last 72 hours. CBC:  Recent Labs  06/29/16 0335  WBC 9.2  HGB 15.5  HCT 45.5  MCV 79.1  PLT 218   Cardiac Enzymes: No results for input(s): CKTOTAL, CKMB, CKMBINDEX, TROPONINI in the last 72 hours. BNP: Invalid input(s): POCBNP D-Dimer: No results for input(s): DDIMER in the last 72 hours. Hemoglobin A1C: No results for input(s): HGBA1C in the last 72 hours. Fasting Lipid Panel: No results for input(s): CHOL, HDL, LDLCALC, TRIG, CHOLHDL, LDLDIRECT in the last 72 hours. Thyroid Function Tests: No results for input(s): TSH, T4TOTAL, T3FREE, THYROIDAB in the last 72 hours.  Invalid input(s): FREET3 Anemia Panel: No results for input(s): VITAMINB12, FOLATE, FERRITIN, TIBC, IRON, RETICCTPCT in the last 72 hours.  RADIOLOGY: Dg Chest Portable 1 View  Result Date: 06/26/2016 CLINICAL DATA:  Respiratory failure. EXAM: PORTABLE CHEST 1 VIEW COMPARISON:  06/23/2016.  CT 01/01/ 2018. FINDINGS: Persistent mediastinal fullness noted consistent with previously identified adenopathy. Cardiomegaly with diffuse bilateral from interstitial prominence and bilateral pleural effusions consistent congestive heart failure. Progressive bibasilar atelectasis. No pneumothorax . IMPRESSION: 1. Persistent mediastinal fullness consistent previously identified adenopathy. 2. Cardiomegaly with diffuse bilateral pulmonary interstitial prominence and small bilateral pleural effusions consistent with congestive heart failure. 3.  Progressive bibasilar atelectasis . Electronically Signed   By: Marcello Moores  Register   On: 06/26/2016 06:49   Dg Chest Port 1 View  Result Date: 06/24/2016 CLINICAL DATA:  Shortness of breath.  History of diabetes, CHF. EXAM: PORTABLE CHEST 1 VIEW  COMPARISON:  CT chest June 22, 2016 FINDINGS: Cardiac silhouette is moderately enlarged, unchanged. Mediastinal silhouette is nonsuspicious. Diffusely coarsened pulmonary interstitium. Increased lung volumes. No pleural effusion or focal consolidation. No pneumothorax. Soft tissue planes and included osseous structures are nonsuspicious. IMPRESSION: Stable cardiomegaly. Probable COPD, in addition to diffuse coarsened pulmonary interstitium better characterized on yesterday's CT chest. Electronically Signed   By: Elon Alas M.D.   On: 06/24/2016 02:19    PHYSICAL EXAM General: NAD, obese. Sitting in chair NAD.  Neck: Thick, JVP difficult but does not appear elevated, no thyromegaly or thyroid nodule.  Lungs: Distant breath sounds.  CV: Nondisplaced PMI.  Heart regular S1/S2, no S3/S4, no murmur.  1+ edema to knees.  + UNNA boots Abdomen: markedly obese.Soft, nontender, + distention.  Neurologic: Alert and oriented x 3.  Psych: Normal affect. Extremities: No clubbing or cyanosis.   TELEMETRY: Reviewed telemetry pt in NSR  ASSESSMENT AND PLAN: 66 yo with history of CKD stage III, DM, HTN, CAD presented to [facility] with acute CHF.  Echo was done showing EF 15-20%, he was hypoxic requiring Bipap.  Creatinine elevated, ?chronicity as no recent prior BMET.  Transferred to [facility] for management.  1. Acute systolic CHF: Uncertain chronicity of low EF.  Echo (1/18) at [facility] with EF 15-20%, moderate LVH.  Last echo in 2013 had normal EF.  History of CAD, so possible ischemic cardiomyopathy.  Also may have sarcoid based on CT chest at [facility].  Volume status improved with diuresis. He remains on oxygen by nasal cannula. [place] 06/26/16 with moderate to severe PAH (likely group 3). PCWP 18 and normal cardiac output.  Creatinine elevated but stable.  - Transition to torsemide 60 mg daily today.  - Continue UNNA boots - Transition to Bidil 2 tabs tid.  - Continue Coreg.  - No ACEI/ARB/ARNI/spironolactone  with elevated creatinine.  - Will need eventual ischemic workup but creatinine currently prohibitive in absence of ACS.  2. CAD: History of remote mid RCA stent.  Last cath in 2005 with 50% in-stent restenosis in RCA.  Mild stable troponin elevation likely demand ischemia/volume overload.  - Continue ASA (can decrease to 81 daily), Plavix, statin.  - Will need eventual ischemic workup but creatinine currently prohibitive in absence of ACS.  3. ?AKI on CKD stage III: Most recent prior creatinine was 1.48 in 2014, so not sure of chronicity of elevated creatinine (acute versus chronic).  Will have to watch closely with diuresis.  Creatinine stable today.  4. Abnormal chest CT: Concern for sarcoidosis.  He has been started on prednisone.  Pulmonary following.  ACE level is not elevated. Needs ongoing workup.  5. DM II: Per primary service.  6. Gout: Stable.  7. OSA/OHS:  Suspected.  He will use Bipap at night and oxygen during the day.  8. Hyperkalemia: Resolved 9. Morbid obesity - needs weight loss.  Loralie Champagne MD 06/29/2016 8:15 AM

## 2016-06-29 NOTE — Progress Notes (Signed)
PULMONARY / CRITICAL CARE MEDICINE   Name: Gabriel Alvarez MRN: [id number] DOB: 11-Dec-1950    ADMISSION DATE:  06/23/2016 CONSULTATION DATE:  62/4  REFERRING MD:  Elgergawy ([facility])   CHIEF COMPLAINT:  Respiratory failure  HISTORY OF PRESENT ILLNESS:   66yo male smoker with hx DM, CAD, chronic combined CHF, CKD IV, HTN initially presented to [facility] hospital with SOB, weight gain of 80lbs in last 30 days.  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  Echo showed EF 15% and he was tx to [facility] 1/2 for cardiology eval.  CT chest showed ground glass opacities with some question for sarcoid v edema.  He has continued to require bipap despite diuresis and PCCM consulted.   Currently c/o mild dyspnea although improved, mostly with exertion.  Also c/o orthopnea, weight gain, BLE edema.  Denies PND, chest pain, hemoptysis.  DOES have family hx of sarcoid -- his daughter died last year at age 16 "of sarcoid".  No other autoimmune family hx.  Current smoker - abt 1/2ppd.  40 pack years.    SUBJECTIVE:  Remains on HFNC. Shaving.  Sats 72% on RA (pt took it off to shave). No c/o.Wants to go home.   VITAL SIGNS: BP (!) 143/75 (BP Location: Right Arm)   Pulse 81   Temp 97.7 F (36.5 C) (Oral)   Resp 19   Ht _0  (1.803 m)   Wt 135 kg (297 lb 9.6 oz)   SpO2 91%   BMI 41.51 kg/m   INTAKE / OUTPUT: I/O last 3 completed shifts: In: 17 [P.O.:960] Out: 7950 [Urine:7950]  PHYSICAL EXAMINATION: General:  Pleasant, obese male, NAD sitting OOB in chair  Neuro:  Awake, alert, appropriate, MAE  HEENT:  Mm moist Cardiovascular:  s1s2 distant  Lungs:  resps even nonlabored, diminished, few scattered crackles throughout  Abdomen:  Round, distended, non tender, +bs  Musculoskeletal:  Warm and dry, 1+ BLE edema  LABS:  BMET  Recent Labs Lab 06/27/16 0219 06/28/16 0226 06/29/16 0335  NA 137 137 137  K 5.4* 4.2 4.8  CL 102 100* 101  CO2 _1 BUN 55* 59* 67*   CREATININE 2.76* 3.14* 3.08*  GLUCOSE 192* 209* 204*    Electrolytes  Recent Labs Lab 06/24/16 0739  06/27/16 0219 06/28/16 0226 06/29/16 0335  CALCIUM 7.9*  < > 8.0* 8.2* 8.3*  MG 1.8  --   --   --   --   < > = values in this interval not displayed.  CBC  Recent Labs Lab 06/25/16 0316 06/26/16 0325 06/29/16 0335  WBC 13.1* 11.4* 9.2  HGB 14.1 14.1 15.5  HCT 43.8 44.1 45.5  PLT 241 198 218    Sepsis Markers  Recent Labs Lab 06/25/16 1614 06/26/16 0325 06/27/16 0219  PROCALCITON 0.19 0.16 0.19    ABG  Recent Labs Lab 06/24/16 0130  PHART 7.351  PCO2ART 47.4  PO2ART 72.1*    Liver Enzymes  Recent Labs Lab 06/23/16 2326  AST 32  ALT 26  ALKPHOS 72  BILITOT 0.4  ALBUMIN 1.9*    Cardiac Enzymes  Recent Labs Lab 06/23/16 2326 06/24/16 0739 06/24/16 1149  TROPONINI 0.13* 0.28* 0.29*    Glucose  Recent Labs Lab 06/28/16 0213 06/28/16 0802 06/28/16 1142 06/28/16 1637 06/28/16 2057 06/29/16 0740  GLUCAP 230* 134* 336* 407* 439* 163*    Imaging No results found.   STUDIES:  Echo ([place]) >>>Echocardiogram was done on 06/22/2016  and showed moderate concentric left ventricular hypertrophy with an LVEF of 15-20%. Also with grade 2 diastolic dysfunction. He had normal biatrial size.Last echo in 2013 with normal LV function.  CT chest ([place])>>> persistent mediastinal and hilar adenopathy unchanged from 2013. Interstitial prominence and patchy groundglass opacities overall findings favoring sarcoidosis could possibly be related to pulmonary edema and small right pleural effusion noted.  RHC 1/5 >>> mildly elevated right and left heart filling pressures, severe PAH  RA mean 12 RV 73/16 PA 80/33, mean 50 PCWP mean 18  Oxygen saturations: PA 65% AO 89%  Cardiac Output (Fick) 7.31  Cardiac Index (Fick) 2.89 PVR: 4.4 WU    CULTURES: Sputum culture  ANTIBIOTICS: None  SIGNIFICANT EVENTS: 1/3> transferred to  [facility]  LINES/TUBES: None  DISCUSSION: 66yo male with hx CKD, CAD now with acute on chronic CHF with EG 15% and acute hypoxic respiratory failure requiring bipap.   ASSESSMENT / PLAN: Acute hypoxic respiratory failure  Tobacco abuse  Presumed COPD Suspected OSA/OHS Abnormal chest CT -- does have strong family hx for sarcoid but hard to interpret current CT given significant volume overload.  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 ?baseline Scr (1.48 in 2014) P:   Monitor closely with diuresis  F/u chem  No ACE/ARB    PCCM will continue to f/u intermittently while inpt.   Nickolas Madrid, NP 06/29/2016  11:04 AM Pager: [phone] or [phone]

## 2016-06-29 NOTE — Plan of Care (Signed)
Problem: Health Behavior/Discharge Planning: Goal: Ability to manage health-related needs will improve Outcome: Not Progressing Patient doesn't seem to understand or want to learn about his health conditions. Patient keeps stating he is ready to die.  Problem: Skin Integrity: Goal: Risk for impaired skin integrity will decrease Outcome: Not Progressing Patient is obese and sits up in recliner chair on his buttocks all day and refusing to turn on his side at night. Patient is however on a bariatric low air loss mattress.  Problem: Tissue Perfusion: Goal: Risk factors for ineffective tissue perfusion will decrease Outcome: Not Progressing Patient continues to smoke at home prior to admission even though he has been educated to stop several times. Patient also states he drinks 13-14 bottles of water a day when at home. Patient educated to limit his fluids but doesn't seem to understand the concept  Problem: Education: Goal: Ability to demonstrate managment of disease process will improve Outcome: Not Progressing Patient doesn't seem to have any understanding of CHF, COPD, or DM Goal: Ability to verbalize understanding of medication therapies will improve Outcome: Not Progressing Patient is not interesting in learning

## 2016-06-29 NOTE — Progress Notes (Signed)
Inpatient Diabetes Program Recommendations  AACE/ADA: New Consensus Statement on Inpatient Glycemic Control (2015)  Target Ranges:  Prepandial:   less than 140 mg/dL      Peak postprandial:   less than 180 mg/dL (1-2 hours)      Critically ill patients:  140 - 180 mg/dL   Results for WARNELL, RASNIC (MRN [id number]) as of 06/29/2016 13:33  Ref. Range 06/28/2016 08:02 06/28/2016 11:42 06/28/2016 16:37 06/28/2016 20:57 06/29/2016 07:40 06/29/2016 11:35  Glucose-Capillary Latest Ref Range: 65 - 99 mg/dL 134 (H) 336 (H) 407 (H) 439 (H) 163 (H) 285 (H)   Review of Glycemic Control  Diabetes history: DM 2 Outpatient Diabetes medications: Lantus 90 units daily, Humalog 50 units with supper Current orders for Inpatient glycemic control: Novolog Moderate TID + HS scale, Prednisone 40 mg Daily, Lantus 60 units BID  Inpatient Diabetes Program Recommendations:   Glucose in the 400's at meal times. However fasting glucose looks good at 160's. Please consider increasing meal coverage to Novolog 14 units TID.  Thanks,  Tama Headings RN, MSN, [facility] Inpatient Diabetes Coordinator Team Pager [phone] (8a-5p)

## 2016-06-29 NOTE — Progress Notes (Signed)
Patient resting comfortably on high flow nasal cannula.  Bipap not needed at this time.

## 2016-06-30 ENCOUNTER — Ambulatory Visit (HOSPITAL_COMMUNITY): Admit: 2016-06-30 | Payer: Medicare Other | Admitting: Cardiology

## 2016-06-30 ENCOUNTER — Inpatient Hospital Stay (HOSPITAL_COMMUNITY): Payer: Managed Care, Other (non HMO)

## 2016-06-30 ENCOUNTER — Encounter (HOSPITAL_COMMUNITY)
Admission: AD | Disposition: A | Discharge status: Home Health | Payer: Self-pay | Source: Other Acute Inpatient Hospital | Attending: Internal Medicine

## 2016-06-30 DIAGNOSIS — I272 Pulmonary hypertension, unspecified: Secondary | ICD-10-CM

## 2016-06-30 HISTORY — PX: CARDIAC CATHETERIZATION: SHX172

## 2016-06-30 LAB — GLUCOSE, CAPILLARY
GLUCOSE-CAPILLARY: 268 mg/dL — AB (ref 65–99)
Glucose-Capillary: 110 mg/dL — ABNORMAL HIGH (ref 65–99)
Glucose-Capillary: 255 mg/dL — ABNORMAL HIGH (ref 65–99)
Glucose-Capillary: 485 mg/dL — ABNORMAL HIGH (ref 65–99)
Glucose-Capillary: 548 mg/dL (ref 65–99)

## 2016-06-30 LAB — BASIC METABOLIC PANEL
Anion gap: 11 (ref 5–15)
Anion gap: 12 (ref 5–15)
BUN: 70 mg/dL — AB (ref 6–20)
BUN: 74 mg/dL — AB (ref 6–20)
CHLORIDE: 98 mmol/L — AB (ref 101–111)
CHLORIDE: 96 mmol/L — AB (ref 101–111)
CO2: 29 mmol/L (ref 22–32)
CO2: 26 mmol/L (ref 22–32)
CREATININE: 3.13 mg/dL — AB (ref 0.61–1.24)
CREATININE: 3.17 mg/dL — AB (ref 0.61–1.24)
Calcium: 8.5 mg/dL — ABNORMAL LOW (ref 8.9–10.3)
Calcium: 8.2 mg/dL — ABNORMAL LOW (ref 8.9–10.3)
GFR calc Af Amer: 22 mL/min — ABNORMAL LOW (ref >60)
GFR calc Af Amer: 22 mL/min — ABNORMAL LOW (ref >60)
GFR calc non Af Amer: 19 mL/min — ABNORMAL LOW (ref >60)
GFR calc non Af Amer: 19 mL/min — ABNORMAL LOW (ref >60)
GLUCOSE: 165 mg/dL — AB (ref 65–99)
Glucose, Bld: 538 mg/dL (ref 65–99)
Potassium: 4.1 mmol/L (ref 3.5–5.1)
Potassium: 4.5 mmol/L (ref 3.5–5.1)
SODIUM: 134 mmol/L — AB (ref 135–145)
Sodium: 138 mmol/L (ref 135–145)

## 2016-06-30 LAB — POCT I-STAT 3, VENOUS BLOOD GAS (G3P V)
ACID-BASE EXCESS: 4 mmol/L — AB (ref 0.0–2.0)
ACID-BASE EXCESS: 5 mmol/L — AB (ref 0.0–2.0)
ACID-BASE EXCESS: 7 mmol/L — AB (ref 0.0–2.0)
Acid-Base Excess: 5 mmol/L — ABNORMAL HIGH (ref 0.0–2.0)
BICARBONATE: 30.5 mmol/L — AB (ref 20.0–28.0)
BICARBONATE: 31.3 mmol/L — AB (ref 20.0–28.0)
Bicarbonate: 29.5 mmol/L — ABNORMAL HIGH (ref 20.0–28.0)
Bicarbonate: 32.2 mmol/L — ABNORMAL HIGH (ref 20.0–28.0)
O2 SAT: 72 %
O2 SAT: 72 %
O2 SAT: 74 %
O2 Saturation: 69 %
PCO2 VEN: 46.2 mmHg (ref 44.0–60.0)
PCO2 VEN: 46.4 mmHg (ref 44.0–60.0)
PH VEN: 7.421 (ref 7.250–7.430)
PO2 VEN: 37 mmHg (ref 32.0–45.0)
PO2 VEN: 39 mmHg (ref 32.0–45.0)
TCO2: 31 mmol/L (ref 0–100)
TCO2: 33 mmol/L (ref 0–100)
TCO2: 34 mmol/L (ref 0–100)
TCO2: 32 mmol/L (ref 0–100)
pCO2, Ven: 45.9 mmHg (ref 44.0–60.0)
pCO2, Ven: 48.1 mmHg (ref 44.0–60.0)
pH, Ven: 7.413 (ref 7.250–7.430)
pH, Ven: 7.431 — ABNORMAL HIGH (ref 7.250–7.430)
pH, Ven: 7.449 — ABNORMAL HIGH (ref 7.250–7.430)
pO2, Ven: 35 mmHg (ref 32.0–45.0)
pO2, Ven: 37 mmHg (ref 32.0–45.0)

## 2016-06-30 LAB — HISTONE ANTIBODIES, IGG, BLOOD: DNA-HISTONE: 0.3 U (ref 0.0–0.9)

## 2016-06-30 SURGERY — RIGHT HEART CATH

## 2016-06-30 MED ORDER — HEPARIN SODIUM (PORCINE) 5000 UNIT/ML IJ SOLN
5000.0000 [IU] | Freq: Three times a day (TID) | INTRAMUSCULAR | Status: DC
  Administered 2016-06-30 – 2016-07-03 (×9): 5000 [IU] via SUBCUTANEOUS
  Filled 2016-06-30 (×9): qty 1

## 2016-06-30 MED ORDER — HEPARIN (PORCINE) IN NACL 2-0.9 UNIT/ML-% IJ SOLN
INTRAMUSCULAR | Status: AC
  Filled 2016-06-30: qty 1000

## 2016-06-30 MED ORDER — SODIUM CHLORIDE 0.9% FLUSH: 3.0000 mL | INTRAVENOUS | Status: DC | PRN

## 2016-06-30 MED ORDER — ACETAMINOPHEN 325 MG PO TABS: 650.0000 mg | ORAL_TABLET | ORAL | Status: DC | PRN

## 2016-06-30 MED ORDER — FENTANYL CITRATE (PF) 100 MCG/2ML IJ SOLN
INTRAMUSCULAR | Status: DC | PRN
  Administered 2016-06-30 (×2): 25 ug via INTRAVENOUS

## 2016-06-30 MED ORDER — CARVEDILOL 12.5 MG PO TABS
12.5000 mg | ORAL_TABLET | Freq: Two times a day (BID) | ORAL | Status: DC
  Administered 2016-06-30: 12.5 mg via ORAL
  Filled 2016-06-30: qty 1

## 2016-06-30 MED ORDER — HEPARIN (PORCINE) IN NACL 2-0.9 UNIT/ML-% IJ SOLN
INTRAMUSCULAR | Status: DC | PRN
  Administered 2016-06-30: 500 mL

## 2016-06-30 MED ORDER — TORSEMIDE 20 MG PO TABS
40.0000 mg | ORAL_TABLET | Freq: Every day | ORAL | Status: DC
  Administered 2016-07-01 – 2016-07-03 (×3): 40 mg via ORAL
  Filled 2016-06-30 (×3): qty 2

## 2016-06-30 MED ORDER — ONDANSETRON HCL 4 MG/2ML IJ SOLN: 4.0000 mg | Freq: Four times a day (QID) | INTRAMUSCULAR | Status: DC | PRN

## 2016-06-30 MED ORDER — SODIUM CHLORIDE 0.9% FLUSH: 3.0000 mL | Freq: Two times a day (BID) | INTRAVENOUS | Status: DC

## 2016-06-30 MED ORDER — FENTANYL CITRATE (PF) 100 MCG/2ML IJ SOLN
INTRAMUSCULAR | Status: AC
  Filled 2016-06-30: qty 2

## 2016-06-30 MED ORDER — LIDOCAINE HCL (PF) 1 % IJ SOLN
INTRAMUSCULAR | Status: AC
  Filled 2016-06-30: qty 30

## 2016-06-30 MED ORDER — SODIUM CHLORIDE 0.9 % IV SOLN: 250.0000 mL | INTRAVENOUS | Status: DC | PRN

## 2016-06-30 MED ORDER — INSULIN ASPART 100 UNIT/ML ~~LOC~~ SOLN: 12.0000 [IU] | Freq: Three times a day (TID) | SUBCUTANEOUS | Status: DC

## 2016-06-30 MED ORDER — SODIUM CHLORIDE 0.9 % IV SOLN: INTRAVENOUS | Status: DC

## 2016-06-30 MED ORDER — LIDOCAINE HCL (PF) 1 % IJ SOLN
INTRAMUSCULAR | Status: DC | PRN
  Administered 2016-06-30: 20 mL

## 2016-06-30 MED ORDER — INSULIN ASPART 100 UNIT/ML ~~LOC~~ SOLN
20.0000 [IU] | Freq: Once | SUBCUTANEOUS | Status: AC
  Administered 2016-06-30: 20 [IU] via SUBCUTANEOUS

## 2016-06-30 MED ORDER — SODIUM CHLORIDE 0.9% FLUSH
3.0000 mL | Freq: Two times a day (BID) | INTRAVENOUS | Status: DC
  Administered 2016-06-30 – 2016-07-02 (×4): 3 mL via INTRAVENOUS

## 2016-06-30 MED ORDER — SODIUM CHLORIDE 0.9% FLUSH
3.0000 mL | Freq: Two times a day (BID) | INTRAVENOUS | Status: DC
  Administered 2016-06-30 – 2016-07-03 (×6): 3 mL via INTRAVENOUS

## 2016-06-30 SURGICAL SUPPLY — 8 items
CATH SWAN GANZ 7F STRAIGHT (CATHETERS) ×3 IMPLANT
PACK CARDIAC CATHETERIZATION (CUSTOM PROCEDURE TRAY) ×3 IMPLANT
PROTECTION STATION PRESSURIZED (MISCELLANEOUS) ×3
SHEATH PINNACLE 7F 10CM (SHEATH) ×3 IMPLANT
STATION PROTECTION PRESSURIZED (MISCELLANEOUS) ×1 IMPLANT
TRANSDUCER W/STOPCOCK (MISCELLANEOUS) ×3 IMPLANT
TUBING ART PRESS 72  MALE/FEM (TUBING) ×2
TUBING ART PRESS 72 MALE/FEM (TUBING) ×1 IMPLANT

## 2016-06-30 NOTE — H&P (View-Only) (Signed)
Patient ID: Gabriel Alvarez, male   DOB: 1951/06/07, 66 y.o.   MRN: [id number]   ADVANCED HF ROUNDING NOTE  SUBJECTIVE:   Underwent RHC on 1/5. 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  Now on po torsemide, weight down again.  Wearing UNNA boots. Creatinine 2.9 -> 2.7-> 3.1 -> 3.08 -> 3.13. SBP 150s. Still on high flow oxygen, 13 L.  Denies dyspnea.  Wants to go home.   RHC 1/5 RA mean 12 RV 73/16 PA 80/33, mean 50 PCWP mean 18 Cardiac Output (Fick) 7.31  Cardiac Index (Fick) 2.89 PVR: 4.4 WU   Scheduled Meds: . aspirin  81 mg Oral Daily  . bisacodyl  10 mg Oral BID  . bisacodyl  10 mg Rectal Once  . budesonide (PULMICORT) nebulizer solution  0.5 mg Nebulization BID  . carvedilol  12.5 mg Oral BID WC  . clopidogrel  75 mg Oral Daily  . heparin  5,000 Units Subcutaneous Q8H  . insulin aspart  0-15 Units Subcutaneous TID WC  . insulin aspart  0-5 Units Subcutaneous QHS  . insulin aspart  8 Units Subcutaneous TID WC  . insulin glargine  60 Units Subcutaneous BID  . ipratropium-albuterol  3 mL Nebulization QID  . isosorbide-hydrALAZINE  2 tablet Oral TID  . predniSONE  40 mg Oral Q breakfast  . simvastatin  20 mg Oral QHS  . torsemide  60 mg Oral Daily   Continuous Infusions: PRN Meds:.acetaminophen **OR** acetaminophen, acetaminophen, albuterol, hydrALAZINE, ondansetron **OR** ondansetron (ZOFRAN) IV, ondansetron (ZOFRAN) IV    Vitals:   06/29/16 2300 06/30/16 0300 06/30/16 0500 06/30/16 0754  BP:  (!) 163/84  127/84  Pulse:  95  75  Resp:  (!) 26  18  Temp: 97.7 F (36.5 C) 98.7 F (37.1 C)  97.4 F (36.3 C)  TempSrc: Oral Oral  Oral  SpO2:  92%  92%  Weight:   294 lb 15.6 oz (133.8 kg)   Height:        Intake/Output Summary (Last 24 hours) at 06/30/16 0807 Last data filed at 06/30/16 0636  Gross per 24 hour  Intake              800 ml  Output             3100 ml  Net            -2300 ml    LABS: Basic Metabolic Panel:  Recent Labs  06/29/16 0335 06/30/16 0253  NA  137 138  K 4.8 4.1  CL 101 98*  CO2 25 29  GLUCOSE 204* 165*  BUN 67* 70*  CREATININE 3.08* 3.13*  CALCIUM 8.3* 8.5*   Liver Function Tests: No results for input(s): AST, ALT, ALKPHOS, BILITOT, PROT, ALBUMIN in the last 72 hours. No results for input(s): LIPASE, AMYLASE in the last 72 hours. CBC:  Recent Labs  06/29/16 0335  WBC 9.2  HGB 15.5  HCT 45.5  MCV 79.1  PLT 218   Cardiac Enzymes: No results for input(s): CKTOTAL, CKMB, CKMBINDEX, TROPONINI in the last 72 hours. BNP: Invalid input(s): POCBNP D-Dimer: No results for input(s): DDIMER in the last 72 hours. Hemoglobin A1C: No results for input(s): HGBA1C in the last 72 hours. Fasting Lipid Panel: No results for input(s): CHOL, HDL, LDLCALC, TRIG, CHOLHDL, LDLDIRECT in the last 72 hours. Thyroid Function Tests: No results for input(s): TSH, T4TOTAL, T3FREE, THYROIDAB in the last 72 hours.  Invalid input(s): FREET3 Anemia Panel: No results for input(s):  VITAMINB12, FOLATE, FERRITIN, TIBC, IRON, RETICCTPCT in the last 72 hours.  RADIOLOGY: Dg Chest Portable 1 View  Result Date: 06/30/2016 CLINICAL DATA:  66 year old diabetic hypertensive male with pulmonary edema and shortness breath. Subsequent encounter. EXAM: PORTABLE CHEST 1 VIEW COMPARISON:  06/26/2016 and 12/28/2011 chest x-ray. 06/22/2016 chest CT. FINDINGS: Mild pulmonary vascular prominence superimposed upon chronic lung changes. Cardiomegaly. Mediastinal fullness consistent with CT detected adenopathy. Limited evaluation retrocardiac region. IMPRESSION: No significant change. Pulmonary vascular prominence superimposed upon chronic changes. Cardiomegaly. Mediastinal adenopathy as best demonstrated on recent chest CT. Please see CT report. Electronically Signed   By: Genia Del M.D.   On: 06/30/2016 06:55   Dg Chest Portable 1 View  Result Date: 06/26/2016 CLINICAL DATA:  Respiratory failure. EXAM: PORTABLE CHEST 1 VIEW COMPARISON:  06/23/2016.  CT 01/01/  2018. FINDINGS: Persistent mediastinal fullness noted consistent with previously identified adenopathy. Cardiomegaly with diffuse bilateral from interstitial prominence and bilateral pleural effusions consistent congestive heart failure. Progressive bibasilar atelectasis. No pneumothorax . IMPRESSION: 1. Persistent mediastinal fullness consistent previously identified adenopathy. 2. Cardiomegaly with diffuse bilateral pulmonary interstitial prominence and small bilateral pleural effusions consistent with congestive heart failure. 3.  Progressive bibasilar atelectasis . Electronically Signed   By: Marcello Moores  Register   On: 06/26/2016 06:49   Dg Chest Port 1 View  Result Date: 06/24/2016 CLINICAL DATA:  Shortness of breath.  History of diabetes, CHF. EXAM: PORTABLE CHEST 1 VIEW COMPARISON:  CT chest June 22, 2016 FINDINGS: Cardiac silhouette is moderately enlarged, unchanged. Mediastinal silhouette is nonsuspicious. Diffusely coarsened pulmonary interstitium. Increased lung volumes. No pleural effusion or focal consolidation. No pneumothorax. Soft tissue planes and included osseous structures are nonsuspicious. IMPRESSION: Stable cardiomegaly. Probable COPD, in addition to diffuse coarsened pulmonary interstitium better characterized on yesterday's CT chest. Electronically Signed   By: Elon Alas M.D.   On: 06/24/2016 02:19    PHYSICAL EXAM General: NAD, obese. Sitting in chair NAD.  Neck: Thick, JVP difficult, no thyromegaly or thyroid nodule.  Lungs: Distant breath sounds.  CV: Nondisplaced PMI.  Heart regular S1/S2, no S3/S4, no murmur.  1+ edema to knees.  + UNNA boots Abdomen: markedly obese.Soft, nontender, + distention.  Neurologic: Alert and oriented x 3.  Psych: Normal affect. Extremities: No clubbing or cyanosis.   TELEMETRY: Reviewed telemetry pt in NSR  ASSESSMENT AND PLAN: 66 yo with history of CKD stage III, DM, HTN, CAD presented to [facility] with acute CHF.  Echo was done  showing EF 15-20%, he was hypoxic requiring Bipap.  Creatinine elevated, ?chronicity as no recent prior BMET.  Transferred to [facility] for management.  1. Acute systolic CHF: Uncertain chronicity of low EF.  Echo (1/18) at [facility] with EF 15-20%, moderate LVH.  Last echo in 2013 had normal EF.  History of CAD, so possible ischemic cardiomyopathy.  Also may have sarcoid based on CT chest at [facility].  Volume status improved with diuresis. He remains on oxygen by nasal cannula. [place] 06/26/16 with moderate to severe PAH (likely group 3). PCWP 18 and normal cardiac output.  Creatinine elevated but stable.  - Continue torsemide 60 mg daily.  - Continue UNNA boots - Continue Bidil 2 tabs tid.  - Increase Coreg to 12.5 mg bid.  - No ACEI/ARB/ARNI/spironolactone with elevated creatinine.  - Will need eventual ischemic workup but creatinine currently prohibitive in absence of ACS.  - Discussed with Dr Chase Caller: given ongoing high oxygen requirement, will repeat RHC today. Discussed risks/benefits with patient.  If PCWP is optimized,  will be ideal time to repeat HRCT.  2. CAD: History of remote mid RCA stent.  Last cath in 2005 with 50% in-stent restenosis in RCA.  Mild stable troponin elevation likely demand ischemia/volume overload.  - Continue ASA, Plavix, statin.  - Will need eventual ischemic workup but creatinine currently prohibitive in absence of ACS.  3. ?AKI on CKD stage III: Most recent prior creatinine was 1.48 in 2014, so not sure of chronicity of elevated creatinine (acute versus chronic).  Creatinine stably elevated, continue to follow closely.  4. Abnormal chest CT: Concern for sarcoidosis.  He has been started on prednisone.  Pulmonary following.  ACE level is not elevated. Needs ongoing workup => as above, if PCWP normal on today's cath would do HRCT.  5. DM II: Per primary service.  6. Gout: Stable.  7. OSA/OHS: Suspected.  He will use Bipap at night and oxygen during the day.  8. Pulmonary HTN:  Suspect primarily primarily group 3 due to OHS/OSA.  However, could be component due to sarcoidosis. If sarcoid confirmed, could consider trial of inhaled Tyvaso (to limit worsening of oxygen saturation by increased V/Q mismatching).  9. Morbid obesity - needs weight loss.  Loralie Champagne MD 06/30/2016 8:07 AM

## 2016-06-30 NOTE — Progress Notes (Signed)
Upon arrival to patient room to give scheduled nebulizer treatment, patient sitting in chair eating breakfast on high flow nasal cannula.  Decreased flow from 12L to 8L.  Sats currently 98%.  Bipap not needed at this time.  Will give nebulizer treatment once patient is finished eating.

## 2016-06-30 NOTE — Progress Notes (Signed)
Inpatient Diabetes Program Recommendations  AACE/ADA: New Consensus Statement on Inpatient Glycemic Control (2015)  Target Ranges:  Prepandial:   less than 140 mg/dL      Peak postprandial:   less than 180 mg/dL (1-2 hours)      Critically ill patients:  140 - 180 mg/dL   Lab Results  Component Value Date   GLUCAP 255 (H) 06/30/2016   HGBA1C 7.5 06/05/2016    Review of Glycemic Control  Inpatient Diabetes Program Recommendations:   Glucose 110 this am and increased to 255 at lunchtime. Meal coverage not given for breakfast this am. Patient ordered to get with lunch. Glucose has increased into the 300's during the admission at meal times. RN-Please avoid holding meal coverage. If correction is not needed, meal coverage still needs to be given.  Thanks,  Tama Headings RN, MSN, [facility] Inpatient Diabetes Coordinator Team Pager [phone] (8a-5p)

## 2016-06-30 NOTE — Progress Notes (Signed)
Pt is on NIV at this time tolerating it well.

## 2016-06-30 NOTE — Progress Notes (Signed)
PROGRESS NOTE                                                                                                                                                                                                             Patient Demographics:    Gabriel Alvarez, is a 66 y.o. male, DOB - 1951/03/20, GXQ:[id number]  Admit date - 06/23/2016   Admitting Physician Waldemar Dickens, MD  Outpatient Primary MD for the patient is Manon Hilding, MD  LOS - 7  No chief complaint on file.      Brief Narrative  Gabriel Alvarez is a 66 y.o. male with history COPD, ongoing tobacco abuse, diabetes mellitus type 2 and CAD status post stenting as per the patient (I don't have any records of this) presented to the ER at [facility] with complaints of shortness of breath on 06/21/2016, Echo showed EF 15% and he was tx to [facility] 1/2 for cardiology eval.  CT chest showed ground glass opacities with some question for sarcoid v edema, Transferred to [facility] 1/3 for further workup, Seen by cardiology, right heart cath performed 1/5 with evidence of pulmonary hypertension, oxygen demand improving on diuresis, pulmonary following for possible sarcoidosis.  Subjective:    Gabriel Alvarez today has, No headache, No chest pain, No abdominal pain - No Nausea, Reports he is feeling better today, denies any cough     Assessment  & Plan :    Principal Problem:   Acute respiratory failure with hypoxia (HCC) Active Problems:   Coronary artery disease involving native coronary artery of native heart without angina pectoris   Diabetes mellitus type 2 with peripheral artery disease (HCC)   Acute systolic CHF (congestive heart failure) (HCC)   Acute renal failure superimposed on stage 4 chronic kidney disease (HCC)   Hypertensive urgency   Acute CHF (congestive heart failure) (HCC)   SOB (shortness of breath)   Cardiomyopathy (HCC)   Acute respiratory failure with hypoxia  - multifactorial  primarily acute systolic/Diastolic heart failure XK48-18% with grade 2 diastolic dysfunction, seen by cardiology, on diuresis. - As well secondary to sarcoidosis(most likely), seen by pulmonary, on steroids - Very likely OSA plus OHS contributing, on BiPAP daily at bedtime - As well pulmonary arterial hypertension. - Initially on continuous BiPAP, then weaned to high flow nasal cannula, currently on  12 L nasal cannula , and BiPAP daily at bedtime, continues to improve .  Acute systolic / diastolic CHF -Patient presents with anasarca, EF 15-20% with a grade 2 diastolic dysfunction. - Management per cardiology , monitor ins and outs closely so far -19 L since admission , initially on IV Lasix, metolazone transitioned to torsemide  - Continue with Coreg, and BiDil - No ACEI/ARB/ARNI/spironolactone with elevated creatinine. 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 -  repeat RHC today to assess pcwp after adequate diuresis -> and if PASP still high: needs PAH drugs per pulmonary  Possible sarcoidosis - Pulmonary consulted, on prednisone, and Pulmicort and nebs - ESR unremarkable, RFnegative, CRP negative, ACE negative  - Plan for high-resolution CT chest per pulmonary sometime this week  Acute on chronic kidney disease stage 3-4  - given the significant proteinuria and low albumin patient probably has nephrotic syndrome from diabetes. At this time patient is on Lasix. Holding off ACE inhibitor due to worsening renal function,  - Most likely  cardiorenal syndrome contributing to his renal failure, called his PCP office, most recent lab was in 2014 with a creatinine of 1.48.  Hypertension - With elevated blood pressure on admission, currently on Coreg AND BIDIL  Diabetes mellitus type 2  - last hemoglobin A1c 2 days before admission was 7.4. Patient's blood sugar is elevated due to patient being on steroids.  - Labile, but generally uncontrolled, will let to increase his Lantus today at as he is nothing by mouth for right heart cath this afternoon, continue Lantus 60 units twice a day, continue with insulin sliding-scale, and pre-meal insulin.  CAD - Continue with aspirin, Plavix and statin - Elevated troponins non-ACS pattern per cardiology. - Will need ischemic workup for more stable  History of gout on allopurinol.  -  on allopurinol , currently on hold  Code Status : Full  Family Communication  : wife at bedside   Disposition Plan  : pending further work up, awaiting  PT consult   Consults  :  Cardiology, PCCM  Procedures  : Right heart cath 1/5  DVT Prophylaxis  :   Heparin - SCDs   Lab Results  Component Value Date   PLT 218 06/29/2016    Antibiotics  :   Anti-infectives    None        Objective:   Vitals:   06/30/16 0853 06/30/16 0923 06/30/16 0925 06/30/16 1122  BP: (!) 143/90   (!) 125/59  Pulse: 85   90  Resp: (!) 26   19  Temp:    98.2 F (36.8 C)  TempSrc:    Oral  SpO2: 96% 95% 97% (!) 70%  Weight:      Height:        Wt Readings from Last 3 Encounters:  06/30/16 133.8 kg (294 lb 15.6 oz)  06/05/16 (!) 141.1 kg (311 lb)  06/05/16 (!) 141.1 kg (311 lb)     Intake/Output Summary (Last 24 hours) at 06/30/16 1125 Last data filed at 06/30/16 9702  Gross  per 24 hour  Intake              700 ml  Output             3100 ml  Net            -2400 ml     Physical Exam  Awake Alert, Oriented X 3, No new F.N deficits, Normal affect Supple Neck,No JVD, Symmetrical Chest wall  movement, Good air movement bilaterally,Bibasilar Rales RRR,No Gallops,Rubs or new Murmurs, No Parasternal Heave +ve B.Sounds, Abd Soft, No tenderness, , No rebound - guarding or rigidity. No Cyanosis, Clubbing , No new Rash or bruise  , UNNa boots, anasarca improving    Data Review:    CBC  Recent Labs Lab 06/23/16 2326 06/24/16 0739 06/25/16 0316 06/26/16 0325 06/29/16 0335  WBC 15.0* 16.2* 13.1* 11.4* 9.2  HGB 14.3 14.5 14.1 14.1 15.5  HCT 43.5 44.6 43.8 44.1 45.5  PLT 178 232 241 198 218  MCV 81.0 80.1 80.7 81.5 79.1  MCH 26.6 26.0 26.0 26.1 27.0  MCHC 32.9 32.5 32.2 32.0 34.1  RDW 17.8* 17.2* 17.3* 17.2* 16.8*  LYMPHSABS 1.1  --   --   --   --   MONOABS 0.8  --   --   --   --   EOSABS 0.0  --   --   --   --   BASOSABS 0.0  --   --   --   --     Chemistries   Recent Labs Lab 06/23/16 2326 06/24/16 0739  06/26/16 0325 06/27/16 0219 06/28/16 0226 06/29/16 0335 06/30/16 0253  NA 139 141  < > 139 137 137 137 138  K 4.7 4.8  < > 4.3 5.4* 4.2 4.8 4.1  CL 105 110  < > 104 102 100* 101 98*  CO2 20* 23  < > _0 GLUCOSE 281* 176*  < > 199* 192* 209* 204* 165*  BUN 42* 42*  < > 49* 55* 59* 67* 70*  CREATININE 3.07* 2.85*  < > 2.91* 2.76* 3.14* 3.08* 3.13*  CALCIUM 8.0* 7.9*  < > 7.9* 8.0* 8.2* 8.3* 8.5*  MG  --  1.8  --   --   --   --   --   --   AST 32  --   --   --   --   --   --   --   ALT 26  --   --   --   --   --   --   --   ALKPHOS 72  --   --   --   --   --   --   --   BILITOT 0.4  --   --   --   --   --   --   --   < > = values in this interval not displayed. ------------------------------------------------------------------------------------------------------------------ No results for input(s): CHOL, HDL, LDLCALC, TRIG, CHOLHDL, LDLDIRECT in the last 72 hours.  Lab Results  Component Value Date   HGBA1C 7.5 06/05/2016   ------------------------------------------------------------------------------------------------------------------ No  results for input(s): TSH, T4TOTAL, T3FREE, THYROIDAB in the last 72 hours.  Invalid input(s): FREET3 ------------------------------------------------------------------------------------------------------------------ No results for input(s): VITAMINB12, FOLATE, FERRITIN, TIBC, IRON, RETICCTPCT in the last 72 hours.  Coagulation profile No results for input(s): INR, PROTIME in the last 168 hours.  No results for input(s): DDIMER in the last 72 hours.  Cardiac Enzymes  Recent Labs Lab 06/23/16 2326 06/24/16 0739 06/24/16 1149  TROPONINI 0.13* 0.28* 0.29*   ------------------------------------------------------------------------------------------------------------------    Component Value Date/Time   BNP 355.0 (H) 06/23/2016 2326    Inpatient Medications  Scheduled Meds: . aspirin  81 mg Oral Daily  . bisacodyl  10 mg Rectal Once  . budesonide (PULMICORT) nebulizer solution  0.5 mg Nebulization BID  . carvedilol  12.5 mg Oral BID WC  . clopidogrel  75 mg Oral Daily  . heparin  5,000 Units Subcutaneous Q8H  . insulin aspart  0-15 Units Subcutaneous TID WC  . insulin aspart  0-5 Units Subcutaneous QHS  . insulin aspart  8 Units Subcutaneous TID WC  . insulin glargine  60 Units Subcutaneous BID  . ipratropium-albuterol  3 mL Nebulization QID  . isosorbide-hydrALAZINE  2 tablet Oral TID  . predniSONE  40 mg Oral Q breakfast  . simvastatin  20 mg Oral QHS  . torsemide  60 mg Oral Daily   Continuous Infusions: PRN Meds:.acetaminophen **OR** acetaminophen, acetaminophen, albuterol, hydrALAZINE, ondansetron **OR** ondansetron (ZOFRAN) IV, ondansetron (ZOFRAN) IV  Micro Results Recent Results (from the past 240 hour(s))  MRSA PCR Screening     Status: None   Collection Time: 06/23/16  9:34 PM  Result Value Ref Range Status   MRSA by PCR NEGATIVE NEGATIVE Final    Comment:        The GeneXpert MRSA Assay (FDA approved for NASAL specimens only), is one component of  a comprehensive MRSA colonization surveillance program. It is not intended to diagnose MRSA infection nor to guide or monitor treatment for MRSA infections.     Radiology Reports Dg Chest Portable 1 View  Result Date: 06/30/2016 CLINICAL DATA:  66 year old diabetic hypertensive male with pulmonary edema and shortness breath. Subsequent encounter. EXAM: PORTABLE CHEST 1 VIEW COMPARISON:  06/26/2016 and 12/28/2011 chest x-ray. 06/22/2016 chest CT. FINDINGS: Mild pulmonary vascular prominence superimposed upon chronic lung changes. Cardiomegaly. Mediastinal fullness consistent with CT detected adenopathy. Limited evaluation retrocardiac region. IMPRESSION: No significant change. Pulmonary vascular prominence superimposed upon chronic changes. Cardiomegaly. Mediastinal adenopathy as best demonstrated on recent chest CT. Please see CT report. Electronically Signed   By: Genia Del M.D.   On: 06/30/2016 06:55   Dg Chest Portable 1 View  Result Date: 06/26/2016 CLINICAL DATA:  Respiratory failure. EXAM: PORTABLE CHEST 1 VIEW COMPARISON:  06/23/2016.  CT 01/01/ 2018. FINDINGS: Persistent mediastinal fullness noted consistent with previously identified adenopathy. Cardiomegaly with diffuse bilateral from interstitial prominence and bilateral pleural effusions consistent congestive heart failure. Progressive bibasilar atelectasis. No pneumothorax . IMPRESSION: 1. Persistent mediastinal fullness consistent previously identified adenopathy. 2. Cardiomegaly with diffuse bilateral pulmonary interstitial prominence and small bilateral pleural effusions consistent with congestive heart failure. 3.  Progressive bibasilar atelectasis . Electronically Signed   By: Marcello Moores  Register   On: 06/26/2016 06:49   Dg Chest Port 1 View  Result Date: 06/24/2016 CLINICAL DATA:  Shortness of breath.  History of diabetes, CHF. EXAM: PORTABLE CHEST 1 VIEW COMPARISON:  CT chest June 22, 2016 FINDINGS: Cardiac silhouette is  moderately enlarged, unchanged. Mediastinal silhouette is nonsuspicious. Diffusely coarsened pulmonary interstitium. Increased lung volumes. No pleural effusion or focal consolidation. No pneumothorax. Soft tissue planes and included osseous structures are nonsuspicious. IMPRESSION: Stable cardiomegaly. Probable COPD, in addition to diffuse coarsened pulmonary interstitium better characterized on yesterday's CT chest. Electronically Signed   By: Elon Alas M.D.   On: 06/24/2016 02:19     [facility], Alyra Patty M.D on 06/30/2016  at 11:25 AM  Between 7am to 7pm - Pager - [phone]  After 7pm go to [email or web address] - password [facility]  [facility] -  Office  [phone]

## 2016-06-30 NOTE — Progress Notes (Addendum)
Patient ID: Gabriel Alvarez, male   DOB: 03/08/1951, 66 y.o.   MRN: [id number]   ADVANCED HF ROUNDING NOTE  SUBJECTIVE:   Underwent RHC on 1/5. 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  Now on po torsemide, weight down again.  Wearing UNNA boots. Creatinine 2.9 -> 2.7-> 3.1 -> 3.08 -> 3.13. SBP 150s. Still on high flow oxygen, 13 L.  Denies dyspnea.  Wants to go home.   RHC 1/5 RA mean 12 RV 73/16 PA 80/33, mean 50 PCWP mean 18 Cardiac Output (Fick) 7.31  Cardiac Index (Fick) 2.89 PVR: 4.4 WU   Scheduled Meds: . aspirin  81 mg Oral Daily  . bisacodyl  10 mg Oral BID  . bisacodyl  10 mg Rectal Once  . budesonide (PULMICORT) nebulizer solution  0.5 mg Nebulization BID  . carvedilol  12.5 mg Oral BID WC  . clopidogrel  75 mg Oral Daily  . heparin  5,000 Units Subcutaneous Q8H  . insulin aspart  0-15 Units Subcutaneous TID WC  . insulin aspart  0-5 Units Subcutaneous QHS  . insulin aspart  8 Units Subcutaneous TID WC  . insulin glargine  60 Units Subcutaneous BID  . ipratropium-albuterol  3 mL Nebulization QID  . isosorbide-hydrALAZINE  2 tablet Oral TID  . predniSONE  40 mg Oral Q breakfast  . simvastatin  20 mg Oral QHS  . torsemide  60 mg Oral Daily   Continuous Infusions: PRN Meds:.acetaminophen **OR** acetaminophen, acetaminophen, albuterol, hydrALAZINE, ondansetron **OR** ondansetron (ZOFRAN) IV, ondansetron (ZOFRAN) IV    Vitals:   06/29/16 2300 06/30/16 0300 06/30/16 0500 06/30/16 0754  BP:  (!) 163/84  127/84  Pulse:  95  75  Resp:  (!) 26  18  Temp: 97.7 F (36.5 C) 98.7 F (37.1 C)  97.4 F (36.3 C)  TempSrc: Oral Oral  Oral  SpO2:  92%  92%  Weight:   294 lb 15.6 oz (133.8 kg)   Height:        Intake/Output Summary (Last 24 hours) at 06/30/16 0807 Last data filed at 06/30/16 0636  Gross per 24 hour  Intake              800 ml  Output             3100 ml  Net            -2300 ml    LABS: Basic Metabolic Panel:  Recent Labs  06/29/16 0335 06/30/16 0253  NA  137 138  K 4.8 4.1  CL 101 98*  CO2 25 29  GLUCOSE 204* 165*  BUN 67* 70*  CREATININE 3.08* 3.13*  CALCIUM 8.3* 8.5*   Liver Function Tests: No results for input(s): AST, ALT, ALKPHOS, BILITOT, PROT, ALBUMIN in the last 72 hours. No results for input(s): LIPASE, AMYLASE in the last 72 hours. CBC:  Recent Labs  06/29/16 0335  WBC 9.2  HGB 15.5  HCT 45.5  MCV 79.1  PLT 218   Cardiac Enzymes: No results for input(s): CKTOTAL, CKMB, CKMBINDEX, TROPONINI in the last 72 hours. BNP: Invalid input(s): POCBNP D-Dimer: No results for input(s): DDIMER in the last 72 hours. Hemoglobin A1C: No results for input(s): HGBA1C in the last 72 hours. Fasting Lipid Panel: No results for input(s): CHOL, HDL, LDLCALC, TRIG, CHOLHDL, LDLDIRECT in the last 72 hours. Thyroid Function Tests: No results for input(s): TSH, T4TOTAL, T3FREE, THYROIDAB in the last 72 hours.  Invalid input(s): FREET3 Anemia Panel: No results for input(s):   VITAMINB12, FOLATE, FERRITIN, TIBC, IRON, RETICCTPCT in the last 72 hours.  RADIOLOGY: Dg Chest Portable 1 View  Result Date: 06/30/2016 CLINICAL DATA:  66 year old diabetic hypertensive male with pulmonary edema and shortness breath. Subsequent encounter. EXAM: PORTABLE CHEST 1 VIEW COMPARISON:  06/26/2016 and 12/28/2011 chest x-ray. 06/22/2016 chest CT. FINDINGS: Mild pulmonary vascular prominence superimposed upon chronic lung changes. Cardiomegaly. Mediastinal fullness consistent with CT detected adenopathy. Limited evaluation retrocardiac region. IMPRESSION: No significant change. Pulmonary vascular prominence superimposed upon chronic changes. Cardiomegaly. Mediastinal adenopathy as best demonstrated on recent chest CT. Please see CT report. Electronically Signed   By: Genia Del M.D.   On: 06/30/2016 06:55   Dg Chest Portable 1 View  Result Date: 06/26/2016 CLINICAL DATA:  Respiratory failure. EXAM: PORTABLE CHEST 1 VIEW COMPARISON:  06/23/2016.  CT 01/01/  2018. FINDINGS: Persistent mediastinal fullness noted consistent with previously identified adenopathy. Cardiomegaly with diffuse bilateral from interstitial prominence and bilateral pleural effusions consistent congestive heart failure. Progressive bibasilar atelectasis. No pneumothorax . IMPRESSION: 1. Persistent mediastinal fullness consistent previously identified adenopathy. 2. Cardiomegaly with diffuse bilateral pulmonary interstitial prominence and small bilateral pleural effusions consistent with congestive heart failure. 3.  Progressive bibasilar atelectasis . Electronically Signed   By: Marcello Moores  Register   On: 06/26/2016 06:49   Dg Chest Port 1 View  Result Date: 06/24/2016 CLINICAL DATA:  Shortness of breath.  History of diabetes, CHF. EXAM: PORTABLE CHEST 1 VIEW COMPARISON:  CT chest June 22, 2016 FINDINGS: Cardiac silhouette is moderately enlarged, unchanged. Mediastinal silhouette is nonsuspicious. Diffusely coarsened pulmonary interstitium. Increased lung volumes. No pleural effusion or focal consolidation. No pneumothorax. Soft tissue planes and included osseous structures are nonsuspicious. IMPRESSION: Stable cardiomegaly. Probable COPD, in addition to diffuse coarsened pulmonary interstitium better characterized on yesterday's CT chest. Electronically Signed   By: Elon Alas M.D.   On: 06/24/2016 02:19    PHYSICAL EXAM General: NAD, obese. Sitting in chair NAD.  Neck: Thick, JVP difficult, no thyromegaly or thyroid nodule.  Lungs: Distant breath sounds.  CV: Nondisplaced PMI.  Heart regular S1/S2, no S3/S4, no murmur.  1+ edema to knees.  + UNNA boots Abdomen: markedly obese.Soft, nontender, + distention.  Neurologic: Alert and oriented x 3.  Psych: Normal affect. Extremities: No clubbing or cyanosis.   TELEMETRY: Reviewed telemetry pt in NSR  ASSESSMENT AND PLAN: 66 yo with history of CKD stage III, DM, HTN, CAD presented to [facility] with acute CHF.  Echo was done  showing EF 15-20%, he was hypoxic requiring Bipap.  Creatinine elevated, ?chronicity as no recent prior BMET.  Transferred to [facility] for management.  1. Acute systolic CHF: Uncertain chronicity of low EF.  Echo (1/18) at [facility] with EF 15-20%, moderate LVH.  Last echo in 2013 had normal EF.  History of CAD, so possible ischemic cardiomyopathy.  Also may have sarcoid based on CT chest at [facility].  Volume status improved with diuresis. He remains on oxygen by nasal cannula. [place] 06/26/16 with moderate to severe PAH (likely group 3). PCWP 18 and normal cardiac output.  Creatinine elevated but stable.  - Continue torsemide 60 mg daily.  - Continue UNNA boots - Continue Bidil 2 tabs tid.  - Increase Coreg to 12.5 mg bid.  - No ACEI/ARB/ARNI/spironolactone with elevated creatinine.  - Will need eventual ischemic workup but creatinine currently prohibitive in absence of ACS.  - Discussed with Dr Chase Caller: given ongoing high oxygen requirement, will repeat RHC today. Discussed risks/benefits with patient.  If PCWP is optimized,  will be ideal time to repeat HRCT.  2. CAD: History of remote mid RCA stent.  Last cath in 2005 with 50% in-stent restenosis in RCA.  Mild stable troponin elevation likely demand ischemia/volume overload.  - Continue ASA, Plavix, statin.  - Will need eventual ischemic workup but creatinine currently prohibitive in absence of ACS.  3. ?AKI on CKD stage III: Most recent prior creatinine was 1.48 in 2014, so not sure of chronicity of elevated creatinine (acute versus chronic).  Creatinine stably elevated, continue to follow closely.  4. Abnormal chest CT: Concern for sarcoidosis.  He has been started on prednisone.  Pulmonary following.  ACE level is not elevated. Needs ongoing workup => as above, if PCWP normal on today's cath would do HRCT.  5. DM II: Per primary service.  6. Gout: Stable.  7. OSA/OHS: Suspected.  He will use Bipap at night and oxygen during the day.  8. Pulmonary HTN:  Suspect primarily primarily group 3 due to OHS/OSA.  However, could be component due to sarcoidosis. If sarcoid confirmed, could consider trial of inhaled Tyvaso (to limit worsening of oxygen saturation by increased V/Q mismatching).  9. Morbid obesity - needs weight loss.  Loralie Champagne MD 06/30/2016 8:07 AM

## 2016-06-30 NOTE — Progress Notes (Addendum)
Site area: RFV Site Prior to Removal:  Level 0 Pressure Applied For:58mn Manual:   yes Patient Status During Pull: stable  Post Pull Site:  Level o Post Pull Instructions Given:   Post Pull Pulses Present: palpable Dressing Applied: tegaderm  Bedrest begins @ [id number]:

## 2016-06-30 NOTE — Progress Notes (Signed)
PULMONARY / CRITICAL CARE MEDICINE   Name: YOAV OKANE MRN: [id number] DOB: January 30, 1951    ADMISSION DATE:  06/23/2016 CONSULTATION DATE:  40/4  REFERRING MD:  Elgergawy ([facility])   CHIEF COMPLAINT:  Respiratory failure  HISTORY OF PRESENT ILLNESS:   66yo male smoker with hx DM, CAD, chronic combined CHF, CKD IV, HTN initially presented to [facility] hospital with SOB, weight gain of 80lbs in last 30 days.  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  Echo showed EF 15% and he was tx to [facility] 1/2 for cardiology eval.  CT chest showed ground glass opacities with some question for sarcoid v edema.  He has continued to require bipap despite diuresis and PCCM consulted.   Currently c/o mild dyspnea although improved, mostly with exertion.  Also c/o orthopnea, weight gain, BLE edema.  Denies PND, chest pain, hemoptysis.  DOES have family hx of sarcoid -- his daughter died last year at age 54 "of sarcoid".  No other autoimmune family hx.  Current smoker - abt 1/2ppd.  40 pack years.    SUBJECTIVE:  Remains on HFNC, but weaning to 6L  1/9/am.   Sats 72% on RA (pt took it off to shave). No c/o. Very motivated to go home. Wearing Bi Pap at night.  VITAL SIGNS: BP (!) 143/90   Pulse 85   Temp 97.4 F (36.3 C) (Oral)   Resp (!) 26   Ht 5' 11" (1.803 m)   Wt 294 lb 15.6 oz (133.8 kg)   SpO2 97%   BMI 41.14 kg/m   INTAKE / OUTPUT: I/O last 3 completed shifts: In: 800 [P.O.:800] Out: 7525 [Urine:7525]  PHYSICAL EXAMINATION: General:  Pleasant, obese male, NAD sitting OOB in chair  Neuro:  Awake, alert, appropriate, MAE  HEENT:  Mm moist Cardiovascular:  s1s2 distant  Lungs:  resps even nonlabored, diminished, few scattered crackles throughout  Abdomen:  Round, distended, non tender, +bs  Musculoskeletal:  Warm and dry, 1+ BLE edema  LABS:  BMET  Recent Labs Lab 06/28/16 0226 06/29/16 0335 06/30/16 0253  NA 137 137 138  K 4.2 4.8 4.1  CL 100* 101 98*  CO2 _0 BUN 59* 67* 70*  CREATININE 3.14* 3.08* 3.13*  GLUCOSE 209* 204* 165*    Electrolytes  Recent Labs Lab 06/24/16 0739  06/28/16 0226 06/29/16 0335 06/30/16 0253  CALCIUM 7.9*  < > 8.2* 8.3* 8.5*  MG 1.8  --   --   --   --   < > = values in this interval not displayed.  CBC  Recent Labs Lab 06/25/16 0316 06/26/16 0325 06/29/16 0335  WBC 13.1* 11.4* 9.2  HGB 14.1 14.1 15.5  HCT 43.8 44.1 45.5  PLT 241 198 218    Sepsis Markers  Recent Labs Lab 06/25/16 1614 06/26/16 0325 06/27/16 0219  PROCALCITON 0.19 0.16 0.19    ABG  Recent Labs Lab 06/24/16 0130  PHART 7.351  PCO2ART 47.4  PO2ART 72.1*    Liver Enzymes  Recent Labs Lab 06/23/16 2326  AST 32  ALT 26  ALKPHOS 72  BILITOT 0.4  ALBUMIN 1.9*    Cardiac Enzymes  Recent Labs Lab 06/23/16 2326 06/24/16 0739 06/24/16 1149  TROPONINI 0.13* 0.28* 0.29*    Glucose  Recent Labs Lab 06/28/16 2057 06/29/16 0740 06/29/16 1135 06/29/16 1652 06/29/16 2134 06/30/16 0759  GLUCAP 439* 163* 285* 384* 343* 110*    Imaging Dg Chest Portable 1 View  Result Date: 06/30/2016 CLINICAL DATA:  66 year old diabetic hypertensive male with pulmonary edema and shortness breath. Subsequent encounter. EXAM: PORTABLE CHEST 1 VIEW COMPARISON:  06/26/2016 and 12/28/2011 chest x-ray. 06/22/2016 chest CT. FINDINGS: Mild pulmonary vascular prominence superimposed upon chronic lung changes. Cardiomegaly. Mediastinal fullness consistent with CT detected adenopathy. Limited evaluation retrocardiac region. IMPRESSION: No significant change. Pulmonary vascular prominence superimposed upon chronic changes. Cardiomegaly. Mediastinal adenopathy as best demonstrated on recent chest CT. Please see CT report. Electronically Signed   By: Genia Del M.D.   On: 06/30/2016 06:55     STUDIES:  Echo ([facility]) >>>Echocardiogram was done on 06/22/2016 and showed moderate concentric left ventricular hypertrophy with an LVEF  of 15-20%. Also with grade 2 diastolic dysfunction. He had normal biatrial size.Last echo in 2013 with normal LV function.  CT chest ([facility])>>> persistent mediastinal and hilar adenopathy unchanged from 2013. Interstitial prominence and patchy groundglass opacities overall findings favoring sarcoidosis could possibly be related to pulmonary edema and small right pleural effusion noted.  RHC 1/5 >>> mildly elevated right and left heart filling pressures, severe PAH RHC 1/9>>> RA mean 12 RV 73/16 PA 80/33, mean 50 PCWP mean 18  Oxygen saturations: PA 65% AO 89%  Cardiac Output (Fick) 7.31  Cardiac Index (Fick) 2.89 PVR: 4.4 WU    CULTURES: Sputum culture  ANTIBIOTICS: None  SIGNIFICANT EVENTS: 1/3> transferred to [facility]  LINES/TUBES: None  DISCUSSION: 66yo male with hx CKD, CAD now with acute on chronic CHF with EF 15% and acute hypoxic respiratory failure requiring bipap.   ASSESSMENT / PLAN: Acute hypoxic respiratory failure  Tobacco abuse  Presumed COPD Suspected OSA/OHS Continued mediastinal adenopathy per imaging. Abnormal chest CT -- does have strong family hx for sarcoid but hard to interpret current CT given significant volume overload.  -ESR unremarkable, RFnegative, CRP negative, ACE negative - Weaned to 6 L 06/30/16 PLAN -  -Continue bipap PRN and qhs  -Wean O2 for sats >23% - will certainly need home O2 - continue attempts at weaning to acceptable level for home  - gentle diuresis as renal function tolerates per cards . 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 Aundra Dubin  AKI on CKD - SCr stable, good UOP with diuretics. ?baseline Scr (1.48 in 2014) Bump to 3.13 06/30/16 P:   Continue to Monitor closely with diuresis Consider holding diuretics 1/9.  F/u chem 1/10 No ACE/ARB   Discussion: Counseled regarding smoking cessation . Explained this is the single most powerful action he can take to decrease his risk of further/ worsening pulmonary/ cardiac and neuro problems.Explained this is a absolute necessity to improve his health and extend his life.He verbalized understanding.   PCCM will continue to f/u intermittently while inpt.  Magdalen Spatz, AGACNP-BC [facility] 06/30/2016  10:04 AM Pager:  [phone]

## 2016-06-30 NOTE — Interval H&P Note (Signed)
History and Physical Interval Note:  06/30/2016 4:27 PM  Gabriel Alvarez  has presented today for surgery, with the diagnosis of hf  The various methods of treatment have been discussed with the patient and family. After consideration of risks, benefits and other options for treatment, the patient has consented to  Procedure(s): Right Heart Cath (N/A) as a surgical intervention .  The patient's history has been reviewed, patient examined, no change in status, stable for surgery.  I have reviewed the patient's chart and labs.  Questions were answered to the patient's satisfaction.     Brytnee Bechler [employer]

## 2016-06-30 NOTE — Evaluation (Signed)
Physical Therapy Evaluation Patient Details Name: Gabriel Alvarez MRN: [id number] DOB: 03-21-1951 Today's Date: 06/30/2016   History of Present Illness  Pt adm with Acute respiratory failure with hypoxia and acute systolic and diastolic heart failure. Echo showed EF 15%.  PMH - DM, copd, obesity, cad  Clinical Impression  Pt admitted with above diagnosis and presents to PT with functional limitations due to deficits listed below (See PT problem list). Pt needs skilled PT to maximize independence and safety to allow discharge to home. Respiratory status is primary limitation.     Follow Up Recommendations No PT follow up    Equipment Recommendations  Rolling walker with 5" wheels (possibly)    Recommendations for Other Services       Precautions / Restrictions Precautions Precautions: Fall Restrictions Weight Bearing Restrictions: No      Mobility  Bed Mobility               General bed mobility comments: Pt sitting up in chair  Transfers Overall transfer level: Needs assistance Equipment used: None Transfers: Sit to/from Stand Sit to Stand: Min guard         General transfer comment: Assist for safety and balance  Ambulation/Gait Ambulation/Gait assistance: Min assist;Min guard Ambulation Distance (Feet): 65 Feet Assistive device: Rolling walker (2 wheeled) Gait Pattern/deviations: Step-through pattern;Decreased step length - left;Decreased step length - right Gait velocity: decr Gait velocity interpretation: Below normal speed for age/gender General Gait Details: Initially attempted without assistive device and needed min A. Transitioned to rolling walker with incr stability and only needed min guard. Pt with SpO2 84% on high flow nasal cannula on 8L. Incr to 15L with amb. SpO2 to 70%. Pt still conversant and stated he didn't feel any different. Pt breathing through mouth. Encouraged to breath in through his nose. Pt to chair with SpO2 returning to  83%.  Stairs            Wheelchair Mobility    Modified Rankin (Stroke Patients Only)       Balance Overall balance assessment: Needs assistance Sitting-balance support: No upper extremity supported Sitting balance-Leahy Scale: Good     Standing balance support: No upper extremity supported;During functional activity Standing balance-Leahy Scale: Fair                               Pertinent Vitals/Pain Pain Assessment: No/denies pain    Home Living Family/patient expects to be discharged to:: Private residence Living Arrangements: Spouse/significant other Available Help at Discharge: Family;Available 24 hours/day Type of Home: House Home Access: Stairs to enter Entrance Stairs-Rails: Right Entrance Stairs-Number of Steps: 3 Home Layout: One level Home Equipment: None      Prior Function Level of Independence: Independent               Hand Dominance        Extremity/Trunk Assessment   Upper Extremity Assessment Upper Extremity Assessment: Overall WFL for tasks assessed    Lower Extremity Assessment Lower Extremity Assessment: Generalized weakness       Communication   Communication: No difficulties  Cognition Arousal/Alertness: Awake/alert Behavior During Therapy: WFL for tasks assessed/performed Overall Cognitive Status: Within Functional Limits for tasks assessed                      General Comments      Exercises     Assessment/Plan    PT Assessment Patient needs  continued PT services  PT Problem List Decreased strength;Decreased activity tolerance;Decreased balance;Decreased mobility;Cardiopulmonary status limiting activity          PT Treatment Interventions DME instruction;Gait training;Stair training;Functional mobility training;Therapeutic activities;Therapeutic exercise;Balance training;Patient/family education    PT Goals (Current goals can be found in the Care Plan section)  Acute Rehab PT  Goals Patient Stated Goal: return home PT Goal Formulation: With patient Time For Goal Achievement: 07/07/16 Potential to Achieve Goals: Good    Frequency Min 3X/week   Barriers to discharge        Co-evaluation               End of Session Equipment Utilized During Treatment: Oxygen Activity Tolerance: Treatment limited secondary to medical complications (Comment) (low SpO2) Patient left: in chair;with call bell/phone within reach;with chair alarm set;with family/visitor present Nurse Communication: Mobility status         Time: 1201-1226 PT Time Calculation (min) (ACUTE ONLY): 25 min   Charges:   PT Evaluation $PT Eval Moderate Complexity: 1 Procedure PT Treatments $Gait Training: 8-22 mins   PT G CodesShary Decamp [place] Jul 15, 2016, 2:09 PM [employer] PT [phone]

## 2016-06-30 NOTE — Progress Notes (Signed)
2200 CBG 485  MD notified   2226: 20 U of Novolog given  0000: CBG 485  MD notified  15 U given   1300: CBG 418  MD notified  Recheck at [place] at 344

## 2016-07-01 ENCOUNTER — Encounter (HOSPITAL_COMMUNITY): Payer: Self-pay | Admitting: Cardiology

## 2016-07-01 ENCOUNTER — Inpatient Hospital Stay (HOSPITAL_COMMUNITY): Payer: Managed Care, Other (non HMO)

## 2016-07-01 DIAGNOSIS — Z794 Long term (current) use of insulin: Secondary | ICD-10-CM

## 2016-07-01 DIAGNOSIS — I255 Ischemic cardiomyopathy: Secondary | ICD-10-CM

## 2016-07-01 DIAGNOSIS — J9621 Acute and chronic respiratory failure with hypoxia: Secondary | ICD-10-CM

## 2016-07-01 DIAGNOSIS — J811 Chronic pulmonary edema: Secondary | ICD-10-CM | POA: Diagnosis present

## 2016-07-01 DIAGNOSIS — J810 Acute pulmonary edema: Secondary | ICD-10-CM

## 2016-07-01 DIAGNOSIS — E0865 Diabetes mellitus due to underlying condition with hyperglycemia: Secondary | ICD-10-CM

## 2016-07-01 LAB — CBC
HCT: 44.1 % (ref 39.0–52.0)
Hemoglobin: 14.6 g/dL (ref 13.0–17.0)
MCH: 26.1 pg (ref 26.0–34.0)
MCHC: 33.1 g/dL (ref 30.0–36.0)
MCV: 78.9 fL (ref 78.0–100.0)
PLATELETS: 255 10*3/uL (ref 150–400)
RBC: 5.59 MIL/uL (ref 4.22–5.81)
RDW: 16.2 % — AB (ref 11.5–15.5)
WBC: 13 10*3/uL — AB (ref 4.0–10.5)

## 2016-07-01 LAB — BASIC METABOLIC PANEL
Anion gap: 11 (ref 5–15)
BUN: 75 mg/dL — AB (ref 6–20)
CALCIUM: 8.3 mg/dL — AB (ref 8.9–10.3)
CO2: 28 mmol/L (ref 22–32)
CREATININE: 3.04 mg/dL — AB (ref 0.61–1.24)
Chloride: 98 mmol/L — ABNORMAL LOW (ref 101–111)
GFR calc Af Amer: 23 mL/min — ABNORMAL LOW (ref >60)
GFR, EST NON AFRICAN AMERICAN: 20 mL/min — AB (ref >60)
Glucose, Bld: 345 mg/dL — ABNORMAL HIGH (ref 65–99)
Potassium: 4.2 mmol/L (ref 3.5–5.1)
SODIUM: 137 mmol/L (ref 135–145)

## 2016-07-01 LAB — GLUCOSE, CAPILLARY
GLUCOSE-CAPILLARY: 263 mg/dL — AB (ref 65–99)
GLUCOSE-CAPILLARY: 344 mg/dL — AB (ref 65–99)
GLUCOSE-CAPILLARY: 418 mg/dL — AB (ref 65–99)
Glucose-Capillary: 485 mg/dL — ABNORMAL HIGH (ref 65–99)
Glucose-Capillary: 219 mg/dL — ABNORMAL HIGH (ref 65–99)
Glucose-Capillary: 202 mg/dL — ABNORMAL HIGH (ref 65–99)
Glucose-Capillary: 290 mg/dL — ABNORMAL HIGH (ref 65–99)
Glucose-Capillary: 279 mg/dL — ABNORMAL HIGH (ref 65–99)
Glucose-Capillary: 346 mg/dL — ABNORMAL HIGH (ref 65–99)

## 2016-07-01 MED ORDER — INSULIN GLARGINE 100 UNIT/ML ~~LOC~~ SOLN
70.0000 [IU] | Freq: Two times a day (BID) | SUBCUTANEOUS | Status: DC
  Administered 2016-07-01 – 2016-07-03 (×5): 70 [IU] via SUBCUTANEOUS
  Filled 2016-07-01 (×6): qty 0.7

## 2016-07-01 MED ORDER — INSULIN GLARGINE 100 UNIT/ML ~~LOC~~ SOLN
6.0000 [IU] | Freq: Once | SUBCUTANEOUS | Status: AC
  Administered 2016-07-01: 6 [IU] via SUBCUTANEOUS
  Filled 2016-07-01: qty 0.06

## 2016-07-01 MED ORDER — CARVEDILOL 6.25 MG PO TABS
18.7500 mg | ORAL_TABLET | Freq: Two times a day (BID) | ORAL | Status: DC
  Administered 2016-07-01 – 2016-07-02 (×4): 18.75 mg via ORAL
  Filled 2016-07-01 (×4): qty 1

## 2016-07-01 MED ORDER — INSULIN ASPART 100 UNIT/ML ~~LOC~~ SOLN
16.0000 [IU] | Freq: Three times a day (TID) | SUBCUTANEOUS | Status: DC
  Administered 2016-07-01 – 2016-07-03 (×9): 16 [IU] via SUBCUTANEOUS

## 2016-07-01 MED ORDER — INSULIN ASPART 100 UNIT/ML ~~LOC~~ SOLN
15.0000 [IU] | Freq: Once | SUBCUTANEOUS | Status: AC
  Administered 2016-07-01: 15 [IU] via SUBCUTANEOUS

## 2016-07-01 MED ORDER — NICOTINE 21 MG/24HR TD PT24
21.0000 mg | MEDICATED_PATCH | Freq: Every day | TRANSDERMAL | Status: DC
  Administered 2016-07-01 – 2016-07-03 (×3): 21 mg via TRANSDERMAL
  Filled 2016-07-01 (×3): qty 1

## 2016-07-01 NOTE — Progress Notes (Signed)
Patient ID: PETERSON MATHEY, male   DOB: 09-07-1950, 66 y.o.   MRN: [id number]   ADVANCED HF ROUNDING NOTE  SUBJECTIVE:   Underwent RHC on 1/5. 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  RHC again on 1/9 to make sure volume overload resolved.    Now on po torsemide, weight down again.  Wearing UNNA boots. Creatinine 2.9 -> 2.7-> 3.1 -> 3.08 -> 3.13 -> 3.04. SBP 140s-150s. Still on high flow oxygen.  Denies dyspnea.  Wants to go home.   RHC 1/5 RA mean 12 RV 73/16 PA 80/33, mean 50 PCWP mean 18 Cardiac Output (Fick) 7.31  Cardiac Index (Fick) 2.89 PVR: 4.4 WU  RHC 1/9 RA mean 3 RV 70/6 PA 78/21, mean 45 PCWP mean 7 Oxygen saturations: PA 72% RV 72% RA 69% AO 88% Cardiac Output (Fick) 9.82  Cardiac Index (Fick) 3.84 PVR 3.9 WU  Scheduled Meds: . aspirin  81 mg Oral Daily  . bisacodyl  10 mg Rectal Once  . budesonide (PULMICORT) nebulizer solution  0.5 mg Nebulization BID  . carvedilol  18.75 mg Oral BID WC  . clopidogrel  75 mg Oral Daily  . heparin  5,000 Units Subcutaneous Q8H  . insulin aspart  0-15 Units Subcutaneous TID WC  . insulin aspart  0-5 Units Subcutaneous QHS  . insulin aspart  16 Units Subcutaneous TID WC  . insulin glargine  70 Units Subcutaneous BID  . ipratropium-albuterol  3 mL Nebulization QID  . isosorbide-hydrALAZINE  2 tablet Oral TID  . predniSONE  40 mg Oral Q breakfast  . simvastatin  20 mg Oral QHS  . sodium chloride flush  3 mL Intravenous Q12H  . sodium chloride flush  3 mL Intravenous Q12H  . torsemide  40 mg Oral Daily   Continuous Infusions: PRN Meds:.sodium chloride, sodium chloride, acetaminophen **OR** acetaminophen, albuterol, hydrALAZINE, ondansetron (ZOFRAN) IV, ondansetron **OR** [DISCONTINUED] ondansetron (ZOFRAN) IV, sodium chloride flush, sodium chloride flush    Vitals:   06/30/16 2043 07/01/16 0001 07/01/16 0408 07/01/16 0619  BP:  (!) 141/78 (!) 151/82   Pulse:  90 82   Resp:  (!) 26 19   Temp:  98.1 F (36.7 C) 98.1 F (36.7 C)    TempSrc:  Oral Oral   SpO2: 90% 94% 92%   Weight:    293 lb 9.6 oz (133.2 kg)  Height:        Intake/Output Summary (Last 24 hours) at 07/01/16 0755 Last data filed at 07/01/16 0325  Gross per 24 hour  Intake              820 ml  Output             3250 ml  Net            -2430 ml    LABS: Basic Metabolic Panel:  Recent Labs  06/30/16 2231 07/01/16 0218  NA 134* 137  K 4.5 4.2  CL 96* 98*  CO2 26 28  GLUCOSE 538* 345*  BUN 74* 75*  CREATININE 3.17* 3.04*  CALCIUM 8.2* 8.3*   Liver Function Tests: No results for input(s): AST, ALT, ALKPHOS, BILITOT, PROT, ALBUMIN in the last 72 hours. No results for input(s): LIPASE, AMYLASE in the last 72 hours. CBC:  Recent Labs  06/29/16 0335 07/01/16 0218  WBC 9.2 13.0*  HGB 15.5 14.6  HCT 45.5 44.1  MCV 79.1 78.9  PLT 218 255   Cardiac Enzymes: No results for input(s): CKTOTAL, CKMB, CKMBINDEX, TROPONINI  in the last 72 hours. BNP: Invalid input(s): POCBNP D-Dimer: No results for input(s): DDIMER in the last 72 hours. Hemoglobin A1C: No results for input(s): HGBA1C in the last 72 hours. Fasting Lipid Panel: No results for input(s): CHOL, HDL, LDLCALC, TRIG, CHOLHDL, LDLDIRECT in the last 72 hours. Thyroid Function Tests: No results for input(s): TSH, T4TOTAL, T3FREE, THYROIDAB in the last 72 hours.  Invalid input(s): FREET3 Anemia Panel: No results for input(s): VITAMINB12, FOLATE, FERRITIN, TIBC, IRON, RETICCTPCT in the last 72 hours.  RADIOLOGY: Dg Chest Port 1 View  Result Date: 07/01/2016 CLINICAL DATA:  Shortness of breath. EXAM: PORTABLE CHEST 1 VIEW COMPARISON:  CT chest 07/01/2016.  Chest x-ray 07/01/2015. FINDINGS: Mediastinum and hilar structures are normal. Cardiomegaly with normal pulmonary vascularity. Bibasilar atelectasis and infiltrates are present. Small bilateral pleural effusions. No pneumothorax. IMPRESSION: 1. Persistent bibasilar atelectasis and infiltrates. No significant change. Small  bilateral pleural effusions noted. 2. Stable cardiomegaly.  No pulmonary venous congestion. Electronically Signed   By: Marcello Moores  Register   On: 07/01/2016 07:04   Dg Chest Portable 1 View  Result Date: 06/30/2016 CLINICAL DATA:  66 year old diabetic hypertensive male with pulmonary edema and shortness breath. Subsequent encounter. EXAM: PORTABLE CHEST 1 VIEW COMPARISON:  06/26/2016 and 12/28/2011 chest x-ray. 06/22/2016 chest CT. FINDINGS: Mild pulmonary vascular prominence superimposed upon chronic lung changes. Cardiomegaly. Mediastinal fullness consistent with CT detected adenopathy. Limited evaluation retrocardiac region. IMPRESSION: No significant change. Pulmonary vascular prominence superimposed upon chronic changes. Cardiomegaly. Mediastinal adenopathy as best demonstrated on recent chest CT. Please see CT report. Electronically Signed   By: Genia Del M.D.   On: 06/30/2016 06:55   Dg Chest Portable 1 View  Result Date: 06/26/2016 CLINICAL DATA:  Respiratory failure. EXAM: PORTABLE CHEST 1 VIEW COMPARISON:  06/23/2016.  CT 01/01/ 2018. FINDINGS: Persistent mediastinal fullness noted consistent with previously identified adenopathy. Cardiomegaly with diffuse bilateral from interstitial prominence and bilateral pleural effusions consistent congestive heart failure. Progressive bibasilar atelectasis. No pneumothorax . IMPRESSION: 1. Persistent mediastinal fullness consistent previously identified adenopathy. 2. Cardiomegaly with diffuse bilateral pulmonary interstitial prominence and small bilateral pleural effusions consistent with congestive heart failure. 3.  Progressive bibasilar atelectasis . Electronically Signed   By: Marcello Moores  Register   On: 06/26/2016 06:49   Dg Chest Port 1 View  Result Date: 06/24/2016 CLINICAL DATA:  Shortness of breath.  History of diabetes, CHF. EXAM: PORTABLE CHEST 1 VIEW COMPARISON:  CT chest June 22, 2016 FINDINGS: Cardiac silhouette is moderately enlarged,  unchanged. Mediastinal silhouette is nonsuspicious. Diffusely coarsened pulmonary interstitium. Increased lung volumes. No pleural effusion or focal consolidation. No pneumothorax. Soft tissue planes and included osseous structures are nonsuspicious. IMPRESSION: Stable cardiomegaly. Probable COPD, in addition to diffuse coarsened pulmonary interstitium better characterized on yesterday's CT chest. Electronically Signed   By: Elon Alas M.D.   On: 06/24/2016 02:19    PHYSICAL EXAM General: NAD, obese. Sitting in chair NAD.  Neck: Thick, JVP difficult but does not appear elevated, no thyromegaly or thyroid nodule.  Lungs: Distant breath sounds.  CV: Nondisplaced PMI.  Heart regular S1/S2, no S3/S4, no murmur.  1+ edema to knees.  + UNNA boots Abdomen: markedly obese.Soft, nontender, + distention.  Neurologic: Alert and oriented x 3.  Psych: Normal affect. Extremities: No clubbing or cyanosis.   TELEMETRY: Reviewed telemetry pt in NSR  ASSESSMENT AND PLAN: 66 yo with history of CKD stage III, DM, HTN, CAD presented to [facility] with acute CHF.  Echo was done showing EF 15-20%, he  was hypoxic requiring Bipap.  Creatinine elevated, ?chronicity as no recent prior BMET.  Transferred to [facility] for management.  1. Acute systolic CHF: Uncertain chronicity of low EF.  Echo (1/18) at [facility] with EF 15-20%, moderate LVH.  Last echo in 2013 had normal EF.  History of CAD, so possible ischemic cardiomyopathy.  Also may have sarcoid based on CT chest at [facility].  Volume status improved with diuresis, now euvolemic. He remains on oxygen by nasal cannula. [place] 06/30/16 with moderate to severe PAH (likely group 3).  No evidence for left to right shunt.  Filling pressures normal and normal cardiac output.  Creatinine elevated but stable.  - Continue torsemide, decreased to 40 mg daily.  - Continue UNNA boots - Continue Bidil 2 tabs tid.  - Increase Coreg to 18.75 mg bid as BP still elevated.  - No  ACEI/ARB/ARNI/spironolactone with elevated creatinine.  - Will need eventual ischemic workup but creatinine currently prohibitive in absence of ACS.  2. CAD: History of remote mid RCA stent.  Last cath in 2005 with 50% in-stent restenosis in RCA.  Mild stable troponin elevation likely demand ischemia/volume overload.  - Continue ASA, Plavix, statin.  - Will need eventual ischemic workup but creatinine currently prohibitive in absence of ACS.  3. ?AKI on CKD stage III: Most recent prior creatinine was 1.48 in 2014, so not sure of chronicity of elevated creatinine (acute versus chronic).  Creatinine stably elevated, continue to follow closely.  4. Pulmonary:  Has abnormal chest CT, concern for sarcoidosis.  He has been started on prednisone.  Pulmonary following.  ACE level is not elevated. Ongoing hypoxemia, OHS/OSA plays a role.  Filling pressures normal on right heart cath on 1/9, so pulmonary edema is not playing a role at this point.  - HRCT done this morning, awaiting result.     5. DM II: Per primary service.  6. Gout: Stable.  7. OSA/OHS: Suspected.  He will use Bipap at night and oxygen during the day.  8. Pulmonary HTN: Suspect primarily primarily group 3 due to OHS/OSA.  However, could be component due to sarcoidosis. If sarcoid confirmed, could consider trial of inhaled Tyvaso (to limit worsening of oxygen saturation by increased V/Q mismatching).  9. Morbid obesity - needs weight loss.  Loralie Champagne MD 07/01/2016 7:55 AM

## 2016-07-01 NOTE — Progress Notes (Signed)
PULMONARY / CRITICAL CARE MEDICINE   Name: Gabriel Alvarez MRN: [id number] DOB: 1951/05/21    ADMISSION DATE:  06/23/2016 CONSULTATION DATE:  37/4  REFERRING MD:  Elgergawy ([facility])   CHIEF COMPLAINT:  Respiratory failure  HISTORY OF PRESENT ILLNESS:   66yo male smoker with hx DM, CAD, chronic combined CHF, CKD IV, HTN initially presented to [facility] hospital with SOB, weight gain of 80lbs in last 30 days.  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  Echo showed EF 15% and he was tx to [facility] 1/2 for cardiology eval.  CT chest showed ground glass opacities with some question for sarcoid v edema.  He has continued to require bipap despite diuresis and PCCM consulted.   Currently c/o mild dyspnea although improved, mostly with exertion.  Also c/o orthopnea, weight gain, BLE edema.  Denies PND, chest pain, hemoptysis.  DOES have family hx of sarcoid -- his daughter died last year at age 57 "of sarcoid".  No other autoimmune family hx.  Current smoker - abt 1/2ppd.  40 pack years.    SUBJECTIVE:  Remains on HFNC.  Had HRCT 01/10, findings not c/w sarcoidosis or ILD.  Mild residual pulm edema.  Repeat RHC 01/09 with mean PAP 45, PCPW 7.    VITAL SIGNS: BP 137/77 (BP Location: Left Arm)   Pulse 85   Temp 98.1 F (36.7 C) (Oral)   Resp 14   Ht _0  (1.803 m)   Wt 293 lb 9.6 oz (133.2 kg)   SpO2 (!) 88%   BMI 40.95 kg/m   INTAKE / OUTPUT: I/O last 3 completed shifts: In: 1300 [P.O.:1300] Out: 4550 [Urine:4550]  PHYSICAL EXAMINATION: General:  Pleasant, obese male, NAD sitting in recliner Neuro:  Awake, alert, appropriate, MAE  HEENT:  Mm moist Cardiovascular:  s1s2 distant  Lungs:  resps even and non-labored.  Clear Abdomen:  Round, distended, non tender, +bs  Musculoskeletal:  Warm and dry, 1+ BLE edema  LABS:  BMET  Recent Labs Lab 06/30/16 0253 06/30/16 2231 07/01/16 0218  NA 138 134* 137  K 4.1 4.5 4.2  CL 98* 96* 98*  CO2 _1 BUN 70* 74*  75*  CREATININE 3.13* 3.17* 3.04*  GLUCOSE 165* 538* 345*    Electrolytes  Recent Labs Lab 06/30/16 0253 06/30/16 2231 07/01/16 0218  CALCIUM 8.5* 8.2* 8.3*    CBC  Recent Labs Lab 06/26/16 0325 06/29/16 0335 07/01/16 0218  WBC 11.4* 9.2 13.0*  HGB 14.1 15.5 14.6  HCT 44.1 45.5 44.1  PLT 198 218 255    Sepsis Markers  Recent Labs Lab 06/25/16 1614 06/26/16 0325 06/27/16 0219  PROCALCITON 0.19 0.16 0.19    ABG No results for input(s): PHART, PCO2ART, PO2ART in the last 168 hours.  Liver Enzymes No results for input(s): AST, ALT, ALKPHOS, BILITOT, ALBUMIN in the last 168 hours.  Cardiac Enzymes  Recent Labs Lab 06/24/16 1149  TROPONINI 0.29*    Glucose  Recent Labs Lab 06/30/16 2253 07/01/16 0003 07/01/16 0103 07/01/16 0205 07/01/16 0415 07/01/16 0758  GLUCAP 548* 485* 418* 344* 219* 202*    Imaging Ct Chest High Resolution  Result Date: 07/01/2016 CLINICAL DATA:  Shortness of breath. EXAM: CT CHEST WITHOUT CONTRAST TECHNIQUE: Multidetector CT imaging of the chest was performed following the standard protocol without intravenous contrast. High resolution imaging of the lungs, as well as inspiratory and expiratory imaging, was performed. COMPARISON:  06/22/2016 and 12/28/2011. FINDINGS: Cardiovascular: Atherosclerotic calcification of  the arterial vasculature, including three-vessel involvement of the coronary arteries. Pulmonary arteries and heart are enlarged. No pericardial effusion. Mediastinum/Nodes: Mediastinal lymph nodes are chronically enlarged, measuring up to 2.2 cm in the low right paratracheal station, stable from 12/28/2011. Hilar regions are difficult to definitively evaluate without IV contrast. No axillary adenopathy. Esophagus is grossly unremarkable. Lungs/Pleura: Image quality is degraded by respiratory motion. Collapse/ consolidation in both lower lobes, new from 06/22/2016. Suspect residual mild septal thickening. No pleural  fluid. Airway is unremarkable. Upper Abdomen: Visualized portions of the liver and right adrenal gland are unremarkable. Left adrenal calcification. Visualized portions of the spleen, stomach and bowel are grossly unremarkable. Musculoskeletal: No worrisome lytic or sclerotic lesions. Degenerative changes are seen in the spine. IMPRESSION: 1. New collapse/consolidation in both lower lobes, indicative of pneumonia and/or aspiration. 2. Probable residual mild pulmonary edema. 3. Chronic mediastinal adenopathy can be seen in the setting of congestive heart failure. Sarcoid is another consideration. 4. Enlarged pulmonary arteries, indicative of pulmonary arterial hypertension. 5. Aortic atherosclerosis (ICD10-170.0). Coronary artery calcification. Electronically Signed   By: Lorin Picket M.D.   On: 07/01/2016 09:33   Dg Chest Port 1 View  Result Date: 07/01/2016 CLINICAL DATA:  Shortness of breath. EXAM: PORTABLE CHEST 1 VIEW COMPARISON:  CT chest 07/01/2016.  Chest x-ray 07/01/2015. FINDINGS: Mediastinum and hilar structures are normal. Cardiomegaly with normal pulmonary vascularity. Bibasilar atelectasis and infiltrates are present. Small bilateral pleural effusions. No pneumothorax. IMPRESSION: 1. Persistent bibasilar atelectasis and infiltrates. No significant change. Small bilateral pleural effusions noted. 2. Stable cardiomegaly.  No pulmonary venous congestion. Electronically Signed   By: Marcello Moores  Register   On: 07/01/2016 07:04     STUDIES:  Echo ([facility]) >>>Echocardiogram was done on 06/22/2016 and showed moderate concentric left ventricular hypertrophy with an LVEF of 15-20%. Also with grade 2 diastolic dysfunction. He had normal biatrial size.Last echo in 2013 with normal LV function.  CT chest ([facility])>>> persistent mediastinal and hilar adenopathy unchanged from 2013. Interstitial prominence and patchy groundglass opacities overall findings favoring sarcoidosis could possibly be related to  pulmonary edema and small right pleural effusion noted.  RHC 1/5 >>> mildly elevated right and left heart filling pressures, severe PAH, PA mean 50, PCWP 18 RHC 1/9>>> PA mean 45, PCWP mean 7  CULTURES: Sputum culture  ANTIBIOTICS: None  SIGNIFICANT EVENTS: 1/3> transferred to [facility]  LINES/TUBES: None  DISCUSSION: 66yo male with hx CKD, CAD now with acute on chronic CHF with EF 15% and acute hypoxic respiratory failure requiring bipap.   ASSESSMENT / PLAN: Acute hypoxic respiratory failure - there was concern for ILD and sarcoid but HRCT from 01/10 not c/w these.  Repeat RHC with improvement in wedge pressures after diuresis but HRCT still with some mild residual edema.  Suspect that his hypoxia is multifactorial in the setting of group 3 PAH, functional shunt / shunt physiology due to his body habitus with resultant basilar atx, mild pulm edema, OSA / OHS, emphysema Tobacco abuse  Presumed COPD Suspected OSA/OHS Continued mediastinal adenopathy per imaging - HRCT not truly c/w sarcoidosis or ILD.  Abnormal chest CT -- does have strong family hx for sarcoid but hard to interpret current CT given significant volume overload.  Repeat HRCT 01/10 as above. Autoimmune workup negative (ESR unremarkable, RFnegative, CRP negative, ACE negative).  PLAN -  -Continue bipap PRN and qhs  -Wean O2 for sats >99% - will certainly need home O2 - continue attempts at weaning to acceptable level for home  -  gentle diuresis as renal function tolerates per cards -Continue prednisone  -scheduled BD's  - IS while awake - Mobilize OOB as much as possible/ PT treat and eval ordered - stressed importance of this 01/10 - Sleep study as outpt - Will need CPAP/ BiPAP upon discharge after sleep study - consider starting PAH drugs - will defer to Dr. Aundra Dubin.  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 Explained this is the single most powerful action he can take to decrease his risk of further/ worsening pulmonary/ cardiac and neuro problems.Explained this is a absolute necessity to improve his health and extend his life.He verbalized understanding. HRCT from 01/10 not c/w ILD or sarcoid.  Suspect that his hypoxia is multifactorial in the setting of group 3 PAH, functional shunt / shunt physiology due to his body habitus with resultant basilar atx, mild pulm edema, OSA / OHS, emphysema.  He would likely benefit from [facility] drugs, would defer management to Dr. Aundra Dubin.   Montey Hora, [facility] Pager: [phone]  or [phone] 07/01/2016, 11:20 AM

## 2016-07-01 NOTE — Progress Notes (Signed)
Pt refuse BiPAP for the night. Told patient if feel SOB to please notify staff. Interventions will be made according.

## 2016-07-01 NOTE — Progress Notes (Signed)
PROGRESS NOTE                                                                                                                                                                                                             Patient Demographics:    Gabriel Alvarez, is a 66 y.o. male, DOB - [birth year], RXV:[id number]  Admit date - 06/23/2016   Admitting Physician Waldemar Dickens, MD  Outpatient Primary MD for the patient is Manon Hilding, MD  LOS - 8  Outpatient Specialists: none  No chief complaint on file.      Brief Narrative   66 year old morbidly obese male with history of COPD, ongoing tobacco use, type 2 diabetes mellitus, CAD status post? Stenting per patient presented to the ED [facility] with shortness of breath on 06/21/2016. 2-D echo done there showed EF of 15% and he was transferred to [facility] for cardiology evaluation. CT of the chest showed groundglass opacity with concern for sarcoid versus edema. Patient underwent right heart catheterization on 1/5 showing pulmonary hypertension and placed on diuresis with improvement. Pulmonary following.    Subjective:   Unhappy that he's on fluid restriction and nursing staff arnt helping him get better.   Assessment  & Plan :    Principal Problem:   Acute respiratory failure with hypoxia (HCC) Possibly secondary to acute biventricular CHF (EF 15-20% with grade 2 diastolic dysfunction) and pulmonary sarcoid.. Diuresing quite well (negative balance of 22 L since admission). Reduced torsemide dose. Still requiring high flow oxygen. Onto strict I/O and daily weight. Strict Fluid restriction.    Active Problems:   Acute on mind systolic and diastolic CHF (congestive heart failure) (HCC) Negative balance of almost 22 L since admission. Torsemide dose reduced. Renal function slowly worsening. RHC yesterday showing moderate to severe PAH (group 3) . Increased Coreg dose.  canot use ACEi/ ARB and Aldactone due to impaired renal function. Continue BiDil. -Suspecting this is ischemic cardiomyopathy. Cardiology recommended eventual ischemic workup once renal function improved.  Severe pulmonary artery hypertension Suspect primary group 3 due to OSA/OHS. Further management per cardiology.     Acute renal failure superimposed on stage 3- 4 chronic kidney disease ([place]) Suspect cardiorenal etiology. Possibly has some underlying chronic kidney disease. Monitor for now.     Coronary artery disease involving native coronary artery of native  heart without angina pectoris Continue aspirin, beta blocker and statin. Needs ischemic workup once renal function better.   Uncontrolled Diabetes mellitus type 2 with peripheral artery disease (HCC) CBG elevated due to steroid use. Was in 54s yesterday. Increased Lantus dose and pre-meal aspart this morning. A1c of 7.4.    ? Pulmonary Sarcoidosis Started on steroid given concern. However HRCT from today does not show any sarcoid changes. Has family history of sarcoid (daughter died at the age of 5 with sarcoidosis). Autoimmune workup including ESR, rheumatoid factor, CRP and Ace negative. Symptoms may be combination of OSA/OHS, COPD, pulmonary edema and ongoing tobacco use. Pulmonary recommends to continue BiPAP at night. Renal Floxin as tolerated. Continue gentle diuresis. Recommend to continue prednisone for now.   Tobacco abuse Counseled on cessation.   Morbid obesity Needs counseling on weight loss.   History of gout Holding allopurinol. Asymptomatic.   Code Status : Full code  Family Communication  : wife at bedside  Disposition Plan  : Pending clinical improvement.  Barriers For Discharge : Active symptoms  Consults  :  Cardiology (heart failure) Pulmonary  Procedures  :  Right heart catheterization (on 1/5 and 1/9) HRCT  DVT Prophylaxis  :  Heparin  Lab Results  Component Value Date   PLT 255  07/01/2016    Antibiotics  :   Anti-infectives    None        Objective:   Vitals:   07/01/16 0408 07/01/16 0619 07/01/16 0700 07/01/16 0908  BP: (!) 151/82  137/77   Pulse: 82  82 85  Resp: _0 Temp: 98.1 F (36.7 C)  98.1 F (36.7 C)   TempSrc: Oral  Oral   SpO2: 92%  (!) 87% (!) 88%  Weight:  133.2 kg (293 lb 9.6 oz)    Height:        Wt Readings from Last 3 Encounters:  07/01/16 133.2 kg (293 lb 9.6 oz)  06/05/16 (!) 141.1 kg (311 lb)  06/05/16 (!) 141.1 kg (311 lb)     Intake/Output Summary (Last 24 hours) at 07/01/16 0916 Last data filed at 07/01/16 0325  Gross per 24 hour  Intake              580 ml  Output             3250 ml  Net            -2670 ml     Physical Exam  Gen: morbidly obese not in distress HEENT: no pallor, moist mucosa, supple neck Chest: diminished bibasilar breath sounds CVS: N S1&S2, no murmurs, rubs or gallop GI: soft, NT, ND, BS+ Musculoskeletal: warm, pitting edema b/l, ACE wrap CNS: Alert and oriented.    Data Review:    CBC  Recent Labs Lab 06/25/16 0316 06/26/16 0325 06/29/16 0335 07/01/16 0218  WBC 13.1* 11.4* 9.2 13.0*  HGB 14.1 14.1 15.5 14.6  HCT 43.8 44.1 45.5 44.1  PLT 241 198 218 255  MCV 80.7 81.5 79.1 78.9  MCH 26.0 26.1 27.0 26.1  MCHC 32.2 32.0 34.1 33.1  RDW 17.3* 17.2* 16.8* 16.2*    Chemistries   Recent Labs Lab 06/28/16 0226 06/29/16 0335 06/30/16 0253 06/30/16 2231 07/01/16 0218  NA 137 137 138 134* 137  K 4.2 4.8 4.1 4.5 4.2  CL 100* 101 98* 96* 98*  CO2 _1 GLUCOSE 209* 204* 165* 538* 345*  BUN 59* 67* 70* 74*  75*  CREATININE 3.14* 3.08* 3.13* 3.17* 3.04*  CALCIUM 8.2* 8.3* 8.5* 8.2* 8.3*   ------------------------------------------------------------------------------------------------------------------ No results for input(s): CHOL, HDL, LDLCALC, TRIG, CHOLHDL, LDLDIRECT in the last 72 hours.  Lab Results  Component Value Date   HGBA1C 7.5 06/05/2016     ------------------------------------------------------------------------------------------------------------------ No results for input(s): TSH, T4TOTAL, T3FREE, THYROIDAB in the last 72 hours.  Invalid input(s): FREET3 ------------------------------------------------------------------------------------------------------------------ No results for input(s): VITAMINB12, FOLATE, FERRITIN, TIBC, IRON, RETICCTPCT in the last 72 hours.  Coagulation profile No results for input(s): INR, PROTIME in the last 168 hours.  No results for input(s): DDIMER in the last 72 hours.  Cardiac Enzymes  Recent Labs Lab 06/24/16 1149  TROPONINI 0.29*   ------------------------------------------------------------------------------------------------------------------    Component Value Date/Time   BNP 355.0 (H) 06/23/2016 2326    Inpatient Medications  Scheduled Meds: . aspirin  81 mg Oral Daily  . bisacodyl  10 mg Rectal Once  . budesonide (PULMICORT) nebulizer solution  0.5 mg Nebulization BID  . carvedilol  18.75 mg Oral BID WC  . clopidogrel  75 mg Oral Daily  . heparin  5,000 Units Subcutaneous Q8H  . insulin aspart  0-15 Units Subcutaneous TID WC  . insulin aspart  0-5 Units Subcutaneous QHS  . insulin aspart  16 Units Subcutaneous TID WC  . insulin glargine  70 Units Subcutaneous BID  . ipratropium-albuterol  3 mL Nebulization QID  . isosorbide-hydrALAZINE  2 tablet Oral TID  . predniSONE  40 mg Oral Q breakfast  . simvastatin  20 mg Oral QHS  . sodium chloride flush  3 mL Intravenous Q12H  . sodium chloride flush  3 mL Intravenous Q12H  . torsemide  40 mg Oral Daily   Continuous Infusions: PRN Meds:.sodium chloride, sodium chloride, acetaminophen **OR** acetaminophen, albuterol, hydrALAZINE, ondansetron (ZOFRAN) IV, ondansetron **OR** [DISCONTINUED] ondansetron (ZOFRAN) IV, sodium chloride flush, sodium chloride flush  Micro Results Recent Results (from the past 240 hour(s))   MRSA PCR Screening     Status: None   Collection Time: 06/23/16  9:34 PM  Result Value Ref Range Status   MRSA by PCR NEGATIVE NEGATIVE Final    Comment:        The GeneXpert MRSA Assay (FDA approved for NASAL specimens only), is one component of a comprehensive MRSA colonization surveillance program. It is not intended to diagnose MRSA infection nor to guide or monitor treatment for MRSA infections.     Radiology Reports Dg Chest Port 1 View  Result Date: 07/01/2016 CLINICAL DATA:  Shortness of breath. EXAM: PORTABLE CHEST 1 VIEW COMPARISON:  CT chest 07/01/2016.  Chest x-ray 07/01/2015. FINDINGS: Mediastinum and hilar structures are normal. Cardiomegaly with normal pulmonary vascularity. Bibasilar atelectasis and infiltrates are present. Small bilateral pleural effusions. No pneumothorax. IMPRESSION: 1. Persistent bibasilar atelectasis and infiltrates. No significant change. Small bilateral pleural effusions noted. 2. Stable cardiomegaly.  No pulmonary venous congestion. Electronically Signed   By: Marcello Moores  Register   On: 07/01/2016 07:04   Dg Chest Portable 1 View  Result Date: 06/30/2016 CLINICAL DATA:  66 year old diabetic hypertensive male with pulmonary edema and shortness breath. Subsequent encounter. EXAM: PORTABLE CHEST 1 VIEW COMPARISON:  06/26/2016 and 12/28/2011 chest x-ray. 06/22/2016 chest CT. FINDINGS: Mild pulmonary vascular prominence superimposed upon chronic lung changes. Cardiomegaly. Mediastinal fullness consistent with CT detected adenopathy. Limited evaluation retrocardiac region. IMPRESSION: No significant change. Pulmonary vascular prominence superimposed upon chronic changes. Cardiomegaly. Mediastinal adenopathy as best demonstrated on recent chest CT. Please see CT report. Electronically Signed  By: Genia Del M.D.   On: 06/30/2016 06:55   Dg Chest Portable 1 View  Result Date: 06/26/2016 CLINICAL DATA:  Respiratory failure. EXAM: PORTABLE CHEST 1 VIEW  COMPARISON:  06/23/2016.  CT 01/01/ 2018. FINDINGS: Persistent mediastinal fullness noted consistent with previously identified adenopathy. Cardiomegaly with diffuse bilateral from interstitial prominence and bilateral pleural effusions consistent congestive heart failure. Progressive bibasilar atelectasis. No pneumothorax . IMPRESSION: 1. Persistent mediastinal fullness consistent previously identified adenopathy. 2. Cardiomegaly with diffuse bilateral pulmonary interstitial prominence and small bilateral pleural effusions consistent with congestive heart failure. 3.  Progressive bibasilar atelectasis . Electronically Signed   By: Marcello Moores  Register   On: 06/26/2016 06:49   Dg Chest Port 1 View  Result Date: 06/24/2016 CLINICAL DATA:  Shortness of breath.  History of diabetes, CHF. EXAM: PORTABLE CHEST 1 VIEW COMPARISON:  CT chest June 22, 2016 FINDINGS: Cardiac silhouette is moderately enlarged, unchanged. Mediastinal silhouette is nonsuspicious. Diffusely coarsened pulmonary interstitium. Increased lung volumes. No pleural effusion or focal consolidation. No pneumothorax. Soft tissue planes and included osseous structures are nonsuspicious. IMPRESSION: Stable cardiomegaly. Probable COPD, in addition to diffuse coarsened pulmonary interstitium better characterized on yesterday's CT chest. Electronically Signed   By: Elon Alas M.D.   On: 06/24/2016 02:19    Time Spent in minutes  35   Louellen Molder M.D on 07/01/2016 at 9:16 AM  Between 7am to 7pm - Pager - [phone]  After 7pm go to [email or web address] - password [facility]  [facility] -  Office  [phone]

## 2016-07-02 ENCOUNTER — Encounter: Payer: Medicare Other | Admitting: Dietician

## 2016-07-02 ENCOUNTER — Ambulatory Visit: Payer: Medicare Other | Admitting: Endocrinology

## 2016-07-02 DIAGNOSIS — J9622 Acute and chronic respiratory failure with hypercapnia: Secondary | ICD-10-CM

## 2016-07-02 LAB — POCT I-STAT 3, ART BLOOD GAS (G3+)
ACID-BASE EXCESS: 3 mmol/L — AB (ref 0.0–2.0)
Bicarbonate: 27.7 mmol/L (ref 20.0–28.0)
O2 Saturation: 87 %
TCO2: 29 mmol/L (ref 0–100)
pCO2 arterial: 38.9 mmHg (ref 32.0–48.0)
pH, Arterial: 7.457 — ABNORMAL HIGH (ref 7.350–7.450)
pO2, Arterial: 48 mmHg — ABNORMAL LOW (ref 83.0–108.0)

## 2016-07-02 LAB — BASIC METABOLIC PANEL
ANION GAP: 10 (ref 5–15)
BUN: 76 mg/dL — ABNORMAL HIGH (ref 6–20)
CALCIUM: 8.4 mg/dL — AB (ref 8.9–10.3)
CO2: 28 mmol/L (ref 22–32)
Chloride: 98 mmol/L — ABNORMAL LOW (ref 101–111)
Creatinine, Ser: 2.91 mg/dL — ABNORMAL HIGH (ref 0.61–1.24)
GFR calc non Af Amer: 21 mL/min — ABNORMAL LOW (ref >60)
GFR, EST AFRICAN AMERICAN: 25 mL/min — AB (ref >60)
Glucose, Bld: 199 mg/dL — ABNORMAL HIGH (ref 65–99)
Potassium: 4.4 mmol/L (ref 3.5–5.1)
Sodium: 136 mmol/L (ref 135–145)

## 2016-07-02 LAB — GLUCOSE, CAPILLARY
GLUCOSE-CAPILLARY: 195 mg/dL — AB (ref 65–99)
GLUCOSE-CAPILLARY: 175 mg/dL — AB (ref 65–99)
GLUCOSE-CAPILLARY: 170 mg/dL — AB (ref 65–99)
GLUCOSE-CAPILLARY: 143 mg/dL — AB (ref 65–99)
Glucose-Capillary: 204 mg/dL — ABNORMAL HIGH (ref 65–99)
Glucose-Capillary: 255 mg/dL — ABNORMAL HIGH (ref 65–99)
Glucose-Capillary: 195 mg/dL — ABNORMAL HIGH (ref 65–99)

## 2016-07-02 MED ORDER — PREDNISONE 20 MG PO TABS: 20.0000 mg | ORAL_TABLET | Freq: Every day | ORAL | Status: DC

## 2016-07-02 NOTE — Progress Notes (Signed)
PULMONARY / CRITICAL CARE MEDICINE   Name: Gabriel Alvarez MRN: [id number] DOB: 21-Nov-1950    ADMISSION DATE:  06/23/2016 CONSULTATION DATE:  37/4  REFERRING MD:  Elgergawy ([facility])   CHIEF COMPLAINT:  Respiratory failure  brioef 66yo male smoker with hx DM, CAD, chronic combined CHF, CKD IV, HTN initially presented to [facility] hospital with SOB, weight gain of 80lbs in last 30 days.  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  Echo showed EF 15% and he was tx to [facility] 1/2 for cardiology eval.  CT chest showed ground glass opacities with some question for sarcoid v edema.  He has continued to require bipap despite diuresis and PCCM consulted.   Currently c/o mild dyspnea although improved, mostly with exertion.  Also c/o orthopnea, weight gain, BLE edema.  Denies PND, chest pain, hemoptysis.  DOES have family hx of sarcoid -- his daughter died last year at age 27 "of sarcoid".  No other autoimmune family hx.  Current smoker - abt 1/2ppd.  40 pack years.    EVENTS 06/22/16 - Echo ([facility]) >>>Echocardiogram was done on 06/22/2016 and showed moderate concentric left ventricular hypertrophy with an LVEF of 15-20%. Also with grade 2 diastolic dysfunction. He had normal biatrial size.Last echo in 2013 with normal LV function.  CT chest ([facility])>>> persistent mediastinal and hilar adenopathy unchanged from 2013. Interstitial prominence and patchy groundglass opacities overall findings favoring sarcoidosis could possibly be related to pulmonary edema and small right pleural effusion noted.  06/23/2016 - admit [facility] 1.5/18 RHC 1/5 >>> mildly elevated right and left heart filling pressures, severe PAH, PA mean 50, PCWP 18 06/30/16 - [facility] 1/9>>> PA mean 45, PCWP mean 7 06/30/16 HRCT [facility] -NO ILD. NO SARCOID. Mediastinal node is minima. There is Co Art calcification, pulm thn, pulm edema and bilateral LL atelectasis    SUBJECTIVE/OVERNIGHT/INTERVAL HX 1/10 - remains on HFNC. Not sure if was  tried on face mask o2. Did go on bipap last night. This AM has nsal bridge with steristripos and says it helps. RT said he desaturated with exertion. He feels better . Wants to go home   VITAL SIGNS: BP (!) 151/81   Pulse 74   Temp 97.6 F (36.4 C) (Oral)   Resp 20   Ht _0  (1.803 m)   Wt 134.1 kg (295 lb 9.6 oz)   SpO2 90%   BMI 41.23 kg/m   INTAKE / OUTPUT: I/O last 3 completed shifts: In: 1600 [P.O.:1600] Out: 3625 [Urine:3625]  PHYSICAL EXAMINATION: General:  Pleasant, obese male, NAD sitting in recliner. Tolerating hypoxemia real well Neuro:  Awake, alert, appropriate, MAE  HEENT:  Mm moist. HAS STERISTRIPS on NASAL BRIDGE Cardiovascular:  s1s2 distant  Lungs:  resps even and non-labored.  Clear Abdomen:  Round, distended, non tender, +bs  Musculoskeletal:  Warm and dry, 1+ BLE edema  LABS:  PULMONARY  Recent Labs Lab 06/26/16 1126 06/30/16 1709 06/30/16 1710 06/30/16 1711 06/30/16 1716  HCO3 27.5  28.3* 31.3* 30.5* 29.5* 32.2*  TCO2 29  30 33 32 31 34  O2SAT 65.0  65.0 72.0 72.0 74.0 69.0    CBC  Recent Labs Lab 06/26/16 0325 06/29/16 0335 07/01/16 0218  HGB 14.1 15.5 14.6  HCT 44.1 45.5 44.1  WBC 11.4* 9.2 13.0*  PLT 198 218 255    COAGULATION No results for input(s): INR in the last 168 hours.  CARDIAC  No results for input(s): TROPONINI in the last 168 hours. No  results for input(s): PROBNP in the last 168 hours.   CHEMISTRY  Recent Labs Lab 06/29/16 0335 06/30/16 0253 06/30/16 2231 07/01/16 0218 07/02/16 0300  NA 137 138 134* 137 136  K 4.8 4.1 4.5 4.2 4.4  CL 101 98* 96* 98* 98*  CO2 _0 GLUCOSE 204* 165* 538* 345* 199*  BUN 67* 70* 74* 75* 76*  CREATININE 3.08* 3.13* 3.17* 3.04* 2.91*  CALCIUM 8.3* 8.5* 8.2* 8.3* 8.4*   Estimated Creatinine Clearance: 35.4 mL/min (by C-G formula based on SCr of 2.91 mg/dL (H)).   LIVER No results for input(s): AST, ALT, ALKPHOS, BILITOT, PROT, ALBUMIN, INR in the  last 168 hours.   INFECTIOUS  Recent Labs Lab 06/25/16 1614 06/26/16 0325 06/27/16 0219  PROCALCITON 0.19 0.16 0.19     ENDOCRINE CBG (last 3)   Recent Labs  07/02/16 0356 07/02/16 0634 07/02/16 0813  GLUCAP 195* 175* 170*         IMAGING x48h  - image(s) personally visualized  -   highlighted in bold Ct Chest High Resolution  Result Date: 07/01/2016 CLINICAL DATA:  Shortness of breath. EXAM: CT CHEST WITHOUT CONTRAST TECHNIQUE: Multidetector CT imaging of the chest was performed following the standard protocol without intravenous contrast. High resolution imaging of the lungs, as well as inspiratory and expiratory imaging, was performed. COMPARISON:  06/22/2016 and 12/28/2011. FINDINGS: Cardiovascular: Atherosclerotic calcification of the arterial vasculature, including three-vessel involvement of the coronary arteries. Pulmonary arteries and heart are enlarged. No pericardial effusion. Mediastinum/Nodes: Mediastinal lymph nodes are chronically enlarged, measuring up to 2.2 cm in the low right paratracheal station, stable from 12/28/2011. Hilar regions are difficult to definitively evaluate without IV contrast. No axillary adenopathy. Esophagus is grossly unremarkable. Lungs/Pleura: Image quality is degraded by respiratory motion. Collapse/ consolidation in both lower lobes, new from 06/22/2016. Suspect residual mild septal thickening. No pleural fluid. Airway is unremarkable. Upper Abdomen: Visualized portions of the liver and right adrenal gland are unremarkable. Left adrenal calcification. Visualized portions of the spleen, stomach and bowel are grossly unremarkable. Musculoskeletal: No worrisome lytic or sclerotic lesions. Degenerative changes are seen in the spine. IMPRESSION: 1. New collapse/consolidation in both lower lobes, indicative of pneumonia and/or aspiration. 2. Probable residual mild pulmonary edema. 3. Chronic mediastinal adenopathy can be seen in the setting of  congestive heart failure. Sarcoid is another consideration. 4. Enlarged pulmonary arteries, indicative of pulmonary arterial hypertension. 5. Aortic atherosclerosis (ICD10-170.0). Coronary artery calcification. Electronically Signed   By: Lorin Picket M.D.   On: 07/01/2016 09:33   Dg Chest Port 1 View  Result Date: 07/01/2016 CLINICAL DATA:  Shortness of breath. EXAM: PORTABLE CHEST 1 VIEW COMPARISON:  CT chest 07/01/2016.  Chest x-ray 07/01/2015. FINDINGS: Mediastinum and hilar structures are normal. Cardiomegaly with normal pulmonary vascularity. Bibasilar atelectasis and infiltrates are present. Small bilateral pleural effusions. No pneumothorax. IMPRESSION: 1. Persistent bibasilar atelectasis and infiltrates. No significant change. Small bilateral pleural effusions noted. 2. Stable cardiomegaly.  No pulmonary venous congestion. Electronically Signed   By: Marcello Moores  Register   On: 07/01/2016 07:04    DISCUSSION: 66yo male with hx CKD, CAD now with acute on chronic CHF with EF 15% and acute hypoxic respiratory failure requiring bipap.   ASSESSMENT / PLAN: Acute hypoxic respiratory failure   - there was concern for ILD and sarcoid but HRCT from 01/10 not c/w these.    - Repeat RHC 06/30/16 with improvement in wedge pressures after diuresis but still with high PASP  -  Hihg pretest prob Suspected OSA/OHS C - ontinued mediastinal adenopathy per imaging - HRCT not truly c/w sarcoidosis or ILD.  - negative autoimmune profile   OVerall severe hypoxemia due to basal atelectasis related shunting and OSA/OHV and pulm htn with OSA/OHV major part. Possuilbe upper airway nasal obstruction contributing to hypoxemia. Dr Aundra Dubin is of opinion advanced PAH Drugs can make him worse    PLAN -  - ADvised face mask o2 trial with TR - Discussed tracheostomy with patient and lady in room with him - definitely not this admission but he wants to go home and think about it -Continue bipap PRN and qhs  -Wean O2 for  sats >77% - will certainly need home O2 - continue attempts at weaning to acceptable level for home  - gentle diuresis as renal function tolerates per cards -Continue prednisone but redice dose to 98m daily (not clear why he is on this) -scheduled BD's  - IS while awake - Mobilize OOB as much as possible/ PT treat and eval ordered - stressed importance of this 01/10 - Sleep study as outpt - Will need BiPAP upon discharge after sleep study\   Can go to floor   Dr. MBrand Males M.D., [facility] IncC.P Pulmonary and Critical Care Medicine Staff Physician [facility] and [facility] Care Pager: [phone] If no answer or between  15:00h - 7:00h: call [phone]  07/02/2016 10:22 AM

## 2016-07-02 NOTE — Progress Notes (Signed)
Patient wanted the bipap to be removed at this time. Placed patient on a salter high flow cannula set at 11LPM. Sp02=88-92% will contniue to monitor patients SP02 level and work of breathing.

## 2016-07-02 NOTE — Progress Notes (Signed)
Increase to 12 L due to desat 86% on 10L. Sat improved to 88-92%.

## 2016-07-02 NOTE — Progress Notes (Signed)
Patient called RN in to room, patient very agitated and stating he wants venturi mask removed.  Patient stated "he is going home tomorrow no matter what the doctor says and that he doesn't want to wear the oxygen mask any longer".  Patient very agitated and voiced complaints about not being able to get up and walk around as he pleases, complaints about the oxygen mask, and general complaints about being in the hospital.  Patient repeatedly stated "he is going home tomorrow no matter what".  RN placed patient on 12 L HFNC.  RN asked patient if he would like to get back to bed and offered to stay with the patient and let him stand in the room, patient declined.  Paged MD to inform as patient's oxygen levels do drop intermittently in to the 70's and low 80's on HFNC.

## 2016-07-02 NOTE — Progress Notes (Signed)
RN went back in patients room and patient agreed to stand up and stretch with RN.

## 2016-07-02 NOTE — Progress Notes (Signed)
Pt placed on venturi mask 12L, 50% per MD request. RN aware.

## 2016-07-02 NOTE — Progress Notes (Signed)
Placed patient on BIPAP for the night with IPAP set at 18cm and EPAP set at 8cm. Oxygen is set at 60%

## 2016-07-02 NOTE — Progress Notes (Signed)
Physical Therapy Treatment Patient Details Name: Gabriel Alvarez MRN: [id number] DOB: Jul 17, 1950 Today's Date: July 16, 2016    History of Present Illness Pt adm with Acute respiratory failure with hypoxia and acute systolic and diastolic heart failure. Echo showed EF 15%.  PMH - DM, copd, obesity, cad    PT Comments    Pt with improved amb distance today. Continues to have low SpO2 (79-85%) with amb on 15L but is better than the other day.  Follow Up Recommendations  No PT follow up;Other (comment) (pulmonary rehab)     Equipment Recommendations  Rolling walker with 5" wheels    Recommendations for Other Services       Precautions / Restrictions Precautions Precautions: Fall Restrictions Weight Bearing Restrictions: No    Mobility  Bed Mobility               General bed mobility comments: Pt sitting up in chair  Transfers Overall transfer level: Needs assistance Equipment used: None Transfers: Sit to/from Stand Sit to Stand: Supervision         General transfer comment: Assist for safety  Ambulation/Gait Ambulation/Gait assistance: Min guard Ambulation Distance (Feet): 210 Feet Assistive device: Rolling walker (2 wheeled) Gait Pattern/deviations: Step-through pattern;Decreased step length - left;Decreased step length - right Gait velocity: decr Gait velocity interpretation: Below normal speed for age/gender General Gait Details: Amb on 15L of O2 via high flow nasal cannula. SpO2 84% at rest and between 79% and 85% while amb. Verbal cues to stay closer to the walker   Stairs            Wheelchair Mobility    Modified Rankin (Stroke Patients Only)       Balance Overall balance assessment: Needs assistance Sitting-balance support: No upper extremity supported Sitting balance-Leahy Scale: Good     Standing balance support: No upper extremity supported;During functional activity Standing balance-Leahy Scale: Fair                       Cognition Arousal/Alertness: Awake/alert Behavior During Therapy: WFL for tasks assessed/performed Overall Cognitive Status: Within Functional Limits for tasks assessed                      Exercises      General Comments        Pertinent Vitals/Pain      Home Living                      Prior Function            PT Goals (current goals can now be found in the care plan section) Progress towards PT goals: Progressing toward goals    Frequency    Min 3X/week      PT Plan Current plan remains appropriate    Co-evaluation             End of Session Equipment Utilized During Treatment: Oxygen Activity Tolerance: Patient tolerated treatment well Patient left: in chair;with call bell/phone within reach;with chair alarm set;with family/visitor present     Time: 5885-0277 PT Time Calculation (min) (ACUTE ONLY): 15 min  Charges:  $Gait Training: 8-22 mins                    G CodesShary Decamp [facility] 2016-07-16, 9:47 AM Suanne Marker PT [phone]

## 2016-07-02 NOTE — Progress Notes (Signed)
Patient ID: Gabriel Alvarez, male   DOB: Apr 05, 1951, 66 y.o.   MRN: [id number]   ADVANCED HF ROUNDING NOTE  SUBJECTIVE:   Underwent RHC on 1/5. 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    Remains on torsemide, weight up 2 pounds. Yesterday carvedilol was increased.   Complaining about unna boots. Requesting unna boots come off.  Creatinine 2.9. SBP 120-130s.  Still on high flow oxygen.   Mild dyspnea walking. Denies CP.   RHC 1/5 RA mean 12 RV 73/16 PA 80/33, mean 50 PCWP mean 18 Cardiac Output (Fick) 7.31  Cardiac Index (Fick) 2.89 PVR: 4.4 WU  RHC 1/9 RA mean 3 RV 70/6 PA 78/21, mean 45 PCWP mean 7 Oxygen saturations: PA 72% RV 72% RA 69% AO 88% Cardiac Output (Fick) 9.82  Cardiac Index (Fick) 3.84 PVR 3.9 WU  Scheduled Meds: . aspirin  81 mg Oral Daily  . bisacodyl  10 mg Rectal Once  . budesonide (PULMICORT) nebulizer solution  0.5 mg Nebulization BID  . carvedilol  18.75 mg Oral BID WC  . clopidogrel  75 mg Oral Daily  . heparin  5,000 Units Subcutaneous Q8H  . insulin aspart  0-15 Units Subcutaneous TID WC  . insulin aspart  0-5 Units Subcutaneous QHS  . insulin aspart  16 Units Subcutaneous TID WC  . insulin glargine  70 Units Subcutaneous BID  . ipratropium-albuterol  3 mL Nebulization QID  . isosorbide-hydrALAZINE  2 tablet Oral TID  . nicotine  21 mg Transdermal Daily  . [START ON 07/03/2016] predniSONE  20 mg Oral Q breakfast  . simvastatin  20 mg Oral QHS  . sodium chloride flush  3 mL Intravenous Q12H  . sodium chloride flush  3 mL Intravenous Q12H  . torsemide  40 mg Oral Daily   Continuous Infusions: PRN Meds:.sodium chloride, sodium chloride, albuterol, hydrALAZINE, ondansetron (ZOFRAN) IV, ondansetron **OR** [DISCONTINUED] ondansetron (ZOFRAN) IV, sodium chloride flush, sodium chloride flush    Vitals:   07/02/16 0602 07/02/16 0608 07/02/16 0814 07/02/16 1055  BP:   (!) 151/81 121/68  Pulse:   74 73  Resp:   20 13  Temp:   97.6 F (36.4 C)   TempSrc:    Oral   SpO2: 92% 92% 90% (!) 88%  Weight:      Height:        Intake/Output Summary (Last 24 hours) at 07/02/16 1104 Last data filed at 07/02/16 1000  Gross per 24 hour  Intake             1260 ml  Output             1875 ml  Net             -615 ml    LABS: Basic Metabolic Panel:  Recent Labs  07/01/16 0218 07/02/16 0300  NA 137 136  K 4.2 4.4  CL 98* 98*  CO2 28 28  GLUCOSE 345* 199*  BUN 75* 76*  CREATININE 3.04* 2.91*  CALCIUM 8.3* 8.4*   Liver Function Tests: No results for input(s): AST, ALT, ALKPHOS, BILITOT, PROT, ALBUMIN in the last 72 hours. No results for input(s): LIPASE, AMYLASE in the last 72 hours. CBC:  Recent Labs  07/01/16 0218  WBC 13.0*  HGB 14.6  HCT 44.1  MCV 78.9  PLT 255   Cardiac Enzymes: No results for input(s): CKTOTAL, CKMB, CKMBINDEX, TROPONINI in the last 72 hours. BNP: Invalid input(s): POCBNP D-Dimer: No results for input(s): DDIMER in  the last 72 hours. Hemoglobin A1C: No results for input(s): HGBA1C in the last 72 hours. Fasting Lipid Panel: No results for input(s): CHOL, HDL, LDLCALC, TRIG, CHOLHDL, LDLDIRECT in the last 72 hours. Thyroid Function Tests: No results for input(s): TSH, T4TOTAL, T3FREE, THYROIDAB in the last 72 hours.  Invalid input(s): FREET3 Anemia Panel: No results for input(s): VITAMINB12, FOLATE, FERRITIN, TIBC, IRON, RETICCTPCT in the last 72 hours.  RADIOLOGY: Ct Chest High Resolution  Result Date: 07/01/2016 CLINICAL DATA:  Shortness of breath. EXAM: CT CHEST WITHOUT CONTRAST TECHNIQUE: Multidetector CT imaging of the chest was performed following the standard protocol without intravenous contrast. High resolution imaging of the lungs, as well as inspiratory and expiratory imaging, was performed. COMPARISON:  06/22/2016 and 12/28/2011. FINDINGS: Cardiovascular: Atherosclerotic calcification of the arterial vasculature, including three-vessel involvement of the coronary arteries. Pulmonary  arteries and heart are enlarged. No pericardial effusion. Mediastinum/Nodes: Mediastinal lymph nodes are chronically enlarged, measuring up to 2.2 cm in the low right paratracheal station, stable from 12/28/2011. Hilar regions are difficult to definitively evaluate without IV contrast. No axillary adenopathy. Esophagus is grossly unremarkable. Lungs/Pleura: Image quality is degraded by respiratory motion. Collapse/ consolidation in both lower lobes, new from 06/22/2016. Suspect residual mild septal thickening. No pleural fluid. Airway is unremarkable. Upper Abdomen: Visualized portions of the liver and right adrenal gland are unremarkable. Left adrenal calcification. Visualized portions of the spleen, stomach and bowel are grossly unremarkable. Musculoskeletal: No worrisome lytic or sclerotic lesions. Degenerative changes are seen in the spine. IMPRESSION: 1. New collapse/consolidation in both lower lobes, indicative of pneumonia and/or aspiration. 2. Probable residual mild pulmonary edema. 3. Chronic mediastinal adenopathy can be seen in the setting of congestive heart failure. Sarcoid is another consideration. 4. Enlarged pulmonary arteries, indicative of pulmonary arterial hypertension. 5. Aortic atherosclerosis (ICD10-170.0). Coronary artery calcification. Electronically Signed   By: Lorin Picket M.D.   On: 07/01/2016 09:33   Dg Chest Port 1 View  Result Date: 07/01/2016 CLINICAL DATA:  Shortness of breath. EXAM: PORTABLE CHEST 1 VIEW COMPARISON:  CT chest 07/01/2016.  Chest x-ray 07/01/2015. FINDINGS: Mediastinum and hilar structures are normal. Cardiomegaly with normal pulmonary vascularity. Bibasilar atelectasis and infiltrates are present. Small bilateral pleural effusions. No pneumothorax. IMPRESSION: 1. Persistent bibasilar atelectasis and infiltrates. No significant change. Small bilateral pleural effusions noted. 2. Stable cardiomegaly.  No pulmonary venous congestion. Electronically Signed   By:  Marcello Moores  Register   On: 07/01/2016 07:04   Dg Chest Portable 1 View  Result Date: 06/30/2016 CLINICAL DATA:  66 year old diabetic hypertensive male with pulmonary edema and shortness breath. Subsequent encounter. EXAM: PORTABLE CHEST 1 VIEW COMPARISON:  06/26/2016 and 12/28/2011 chest x-ray. 06/22/2016 chest CT. FINDINGS: Mild pulmonary vascular prominence superimposed upon chronic lung changes. Cardiomegaly. Mediastinal fullness consistent with CT detected adenopathy. Limited evaluation retrocardiac region. IMPRESSION: No significant change. Pulmonary vascular prominence superimposed upon chronic changes. Cardiomegaly. Mediastinal adenopathy as best demonstrated on recent chest CT. Please see CT report. Electronically Signed   By: Genia Del M.D.   On: 06/30/2016 06:55   Dg Chest Portable 1 View  Result Date: 06/26/2016 CLINICAL DATA:  Respiratory failure. EXAM: PORTABLE CHEST 1 VIEW COMPARISON:  06/23/2016.  CT 01/01/ 2018. FINDINGS: Persistent mediastinal fullness noted consistent with previously identified adenopathy. Cardiomegaly with diffuse bilateral from interstitial prominence and bilateral pleural effusions consistent congestive heart failure. Progressive bibasilar atelectasis. No pneumothorax . IMPRESSION: 1. Persistent mediastinal fullness consistent previously identified adenopathy. 2. Cardiomegaly with diffuse bilateral pulmonary interstitial prominence and small bilateral  pleural effusions consistent with congestive heart failure. 3.  Progressive bibasilar atelectasis . Electronically Signed   By: Marcello Moores  Register   On: 06/26/2016 06:49   Dg Chest Port 1 View  Result Date: 06/24/2016 CLINICAL DATA:  Shortness of breath.  History of diabetes, CHF. EXAM: PORTABLE CHEST 1 VIEW COMPARISON:  CT chest June 22, 2016 FINDINGS: Cardiac silhouette is moderately enlarged, unchanged. Mediastinal silhouette is nonsuspicious. Diffusely coarsened pulmonary interstitium. Increased lung volumes. No  pleural effusion or focal consolidation. No pneumothorax. Soft tissue planes and included osseous structures are nonsuspicious. IMPRESSION: Stable cardiomegaly. Probable COPD, in addition to diffuse coarsened pulmonary interstitium better characterized on yesterday's CT chest. Electronically Signed   By: Elon Alas M.D.   On: 06/24/2016 02:19    PHYSICAL EXAM General: NAD, obese. Sitting in chair NAD.  Neck: Thick, JVP difficult but does not appear elevated, no thyromegaly or thyroid nodule.  Lungs: Distant breath sounds.  CV: Nondisplaced PMI.  Heart regular S1/S2, no S3/S4, no murmur.  1-2+ edema to knees.  Abdomen: markedly obese.Soft, nontender, + distention.  Neurologic: Alert and oriented x 3.  Psych: Normal affect. Extremities: No clubbing or cyanosis. Warm  TELEMETRY: Reviewed telemetry pt in NSR  ASSESSMENT AND PLAN: 66 yo with history of CKD stage III, DM, HTN, CAD presented to [facility] with acute CHF.  Echo was done showing EF 15-20%, he was hypoxic requiring Bipap.  Creatinine elevated, ?chronicity as no recent prior BMET.  Transferred to [facility] for management.  1. Acute systolic CHF: Uncertain chronicity of low EF.  Echo (1/18) at [facility] with EF 15-20%, moderate LVH.  Last echo in 2013 had normal EF.  History of CAD, so possible ischemic cardiomyopathy.  Also may have sarcoid based on CT chest at [facility].  Volume status improved with diuresis, now euvolemic. He remains on high flow oxygen by nasal cannula. [place] 06/30/16 with moderate to severe PAH (likely group 3).  No evidence for left to right shunt.  Filling pressures normal and normal cardiac output.  Creatinine elevated but stable.  - Continue torsemide 40 mg daily. If weight up tomorrow, would increase torsemide back to 60 mg daily.  - Intolerant Unna Boots. Placed ted hose.   - Continue Bidil 2 tabs tid.  - Increase Coreg to 25 mg twice a day.   - No ACEI/ARB/ARNI/spironolactone with elevated creatinine.  - Will need  eventual ischemic workup but creatinine currently prohibitive in absence of ACS.  2. CAD: History of remote mid RCA stent.  Last cath in 2005 with 50% in-stent restenosis in RCA.  Mild stable troponin elevation likely demand ischemia/volume overload.  - Continue ASA, Plavix, statin.  - Will need eventual ischemic workup but creatinine currently prohibitive in absence of ACS.  3. ?AKI on CKD stage III: Most recent prior creatinine was 1.48 in 2014, so not sure of chronicity of elevated creatinine (acute versus chronic).  Creatinine leveled off at 2.9.   4. Pulmonary:  Has abnormal chest CT, concern for sarcoidosis.  He has been started on prednisone.  Pulmonary following.  ACE level is not elevated. Ongoing hypoxemia, OHS/OSA plays a large role most likely.  Filling pressures normal on right heart cath on 1/9, so pulmonary edema is not playing a role at this point.  HRCT on 1/10 per Dr. Chase Caller was not indicative of sarcoidosis or ILD.  - He is on O2 by facemask today.  Need to get to a form of oxygen that he can go home on.  He  refuses trach.  5. DM II: Per primary service.  6. Gout: Stable.  7. OSA/OHS: Suspected.  He will use Bipap at night and oxygen during the day.  8. Pulmonary HTN: Suspect primarily primarily group 3 due to OHS/OSA.  HRCT does not appear consistent with sarcoid or ILD.   9. Morbid obesity - needs weight loss.  Amy Clegg NP-C  07/02/2016 11:04 AM   Patient seen with NP, agree with the above note.  He is stable from a cardiac standpoint.  Weight up a bit, if up again tomorrow would increase torsemide back to 60 daily.  Increase Coreg to 25 mg bid.   Still requiring oxygen by face mask.  Suspect severe OHS/OSA.  Per Dr Chase Caller, HRCT not consistent with sarcoidosis or ILD.  Group 3 PH, unlikely to benefit from pulmonary vasodilators.  Needs to get to a form of oxygen that he can go home with.   Loralie Champagne 07/02/2016 1:16 PM

## 2016-07-02 NOTE — Progress Notes (Signed)
PROGRESS NOTE                                                                                                                                                                                                             Patient Demographics:    Gabriel Alvarez, is a 66 y.o. male, DOB - [birth year], MAU:[id number]  Admit date - 06/23/2016   Admitting Physician Waldemar Dickens, MD  Outpatient Primary MD for the patient is Manon Hilding, MD  LOS - 9  Outpatient Specialists: none  No chief complaint on file.      Brief Narrative   66 year old morbidly obese male with history of COPD, ongoing tobacco use, type 2 diabetes mellitus, CAD status post? Stenting per patient presented to the ED [facility] with shortness of breath on 06/21/2016. 2-D echo done there showed EF of 15% and he was transferred to [facility] for cardiology evaluation. CT of the chest showed groundglass opacity with concern for sarcoid versus edema. Patient underwent right heart catheterization on 1/5 showing pulmonary hypertension and placed on diuresis with improvement. Pulmonary following.    Subjective:   Reports breathing to be slightly better today.   Assessment  & Plan :    Principal Problem:   Acute respiratory failure with hypoxia (HCC) Possibly secondary to acute biventricular CHF (EF 15-20% with grade 2 diastolic dysfunction), Pulmonary hypertension, COPD and OSA/OHS.Marland Kitchen HRCT negative for interstitial lung disease or sarcoid.  Diuresing quite well (negative balance of 22 L since admission). Reduced torsemide dose. Still requiring high flow O2 via nasal cannula. Strict I/O. Fluid restriction. Pulmonary discussed options for tracheostomy with patient. Currently advising facemasks O2 trial. (Patient told pulmonary that he will go home and think about tracheostomy). BiPAP daily at bedtime and when necessary. Wean oxygen as tolerated. He will need home  O2. -I will discontinue prednisone. (I think this was started for possible sarcoidosis).      Active Problems:   Acute  systolic and diastolic CHF (congestive heart failure) (HCC) Negative balance of almost 22 L since admission. Torsemide dose reduced. Renal function starting to improve.  RHC showing moderate to severe PAH (group 3) . Increased Coreg dose. canot use ACEi/ ARB and Aldactone due to impaired renal function. Continue BiDil. -Suspecting this is ischemic cardiomyopathy. Cardiology recommend eventual ischemic workup once renal function improved.  Severe  pulmonary artery hypertension Suspect primary group 3 due to OSA/OHS. Further management per cardiology.     Acute renal failure superimposed on stage 3- 4 chronic kidney disease ([place]) Suspect cardiorenal etiology. Possibly has some underlying chronic kidney disease.  Renal function slowly improving. Monitor    Coronary artery disease involving native coronary artery of native heart without angina pectoris Continue aspirin, beta blocker and statin. Needs ischemic workup once renal function better. (Outpatient)   Uncontrolled Diabetes mellitus type 2 with peripheral artery disease (HCC) CBG elevated due to steroid. Improved with Lantus dose and aspart.    Tobacco abuse Counseled on cessation.   Morbid obesity Needs counseling on weight loss.   History of gout Holding allopurinol. Asymptomatic.   Code Status : Full code  Family Communication  : wife at bedside  Disposition Plan  : Pending clinical improvement.  Barriers For Discharge : Active symptoms  Consults  :  Cardiology (heart failure) Pulmonary  Procedures  :  Right heart catheterization (on 1/5 and 1/9) HRCT  DVT Prophylaxis  :  Heparin  Lab Results  Component Value Date   PLT 255 07/01/2016    Antibiotics  :   Anti-infectives    None        Objective:   Vitals:   07/02/16 0814 07/02/16 0849 07/02/16 1055 07/02/16 1132  BP: (!)  151/81 (!) 151/81 121/68 121/68  Pulse: 74 80 73 75  Resp: _0 (!) 24  Temp: 97.6 F (36.4 C)     TempSrc: Oral     SpO2: 90% 92% (!) 88%   Weight:      Height:        Wt Readings from Last 3 Encounters:  07/02/16 134.1 kg (295 lb 9.6 oz)  06/05/16 (!) 141.1 kg (311 lb)  06/05/16 (!) 141.1 kg (311 lb)     Intake/Output Summary (Last 24 hours) at 07/02/16 1153 Last data filed at 07/02/16 1100  Gross per 24 hour  Intake             1260 ml  Output             2300 ml  Net            -1040 ml     Physical Exam  Gen: morbidly obese, not in distress HEENT: moist mucosa, supple neck Chest: diminished bibasilar breath sounds CVS: N S1&S2, no murmurs,  GI: soft, NT, ND Musculoskeletal: warm, Trace pitting edema b/l,  CNS: Alert and oriented.    Data Review:    CBC  Recent Labs Lab 06/26/16 0325 06/29/16 0335 07/01/16 0218  WBC 11.4* 9.2 13.0*  HGB 14.1 15.5 14.6  HCT 44.1 45.5 44.1  PLT 198 218 255  MCV 81.5 79.1 78.9  MCH 26.1 27.0 26.1  MCHC 32.0 34.1 33.1  RDW 17.2* 16.8* 16.2*    Chemistries   Recent Labs Lab 06/29/16 0335 06/30/16 0253 06/30/16 2231 07/01/16 0218 07/02/16 0300  NA 137 138 134* 137 136  K 4.8 4.1 4.5 4.2 4.4  CL 101 98* 96* 98* 98*  CO2 _1 GLUCOSE 204* 165* 538* 345* 199*  BUN 67* 70* 74* 75* 76*  CREATININE 3.08* 3.13* 3.17* 3.04* 2.91*  CALCIUM 8.3* 8.5* 8.2* 8.3* 8.4*   ------------------------------------------------------------------------------------------------------------------ No results for input(s): CHOL, HDL, LDLCALC, TRIG, CHOLHDL, LDLDIRECT in the last 72 hours.  Lab Results  Component Value Date   HGBA1C 7.5 06/05/2016   ------------------------------------------------------------------------------------------------------------------ No results for  input(s): TSH, T4TOTAL, T3FREE, THYROIDAB in the last 72 hours.  Invalid input(s):  FREET3 ------------------------------------------------------------------------------------------------------------------ No results for input(s): VITAMINB12, FOLATE, FERRITIN, TIBC, IRON, RETICCTPCT in the last 72 hours.  Coagulation profile No results for input(s): INR, PROTIME in the last 168 hours.  No results for input(s): DDIMER in the last 72 hours.  Cardiac Enzymes No results for input(s): CKMB, TROPONINI, MYOGLOBIN in the last 168 hours.  Invalid input(s): CK ------------------------------------------------------------------------------------------------------------------    Component Value Date/Time   BNP 355.0 (H) 06/23/2016 2326    Inpatient Medications  Scheduled Meds: . aspirin  81 mg Oral Daily  . bisacodyl  10 mg Rectal Once  . budesonide (PULMICORT) nebulizer solution  0.5 mg Nebulization BID  . carvedilol  18.75 mg Oral BID WC  . clopidogrel  75 mg Oral Daily  . heparin  5,000 Units Subcutaneous Q8H  . insulin aspart  0-15 Units Subcutaneous TID WC  . insulin aspart  0-5 Units Subcutaneous QHS  . insulin aspart  16 Units Subcutaneous TID WC  . insulin glargine  70 Units Subcutaneous BID  . ipratropium-albuterol  3 mL Nebulization QID  . isosorbide-hydrALAZINE  2 tablet Oral TID  . nicotine  21 mg Transdermal Daily  . [START ON 07/03/2016] predniSONE  20 mg Oral Q breakfast  . simvastatin  20 mg Oral QHS  . sodium chloride flush  3 mL Intravenous Q12H  . sodium chloride flush  3 mL Intravenous Q12H  . torsemide  40 mg Oral Daily   Continuous Infusions: PRN Meds:.sodium chloride, sodium chloride, albuterol, hydrALAZINE, ondansetron (ZOFRAN) IV, ondansetron **OR** [DISCONTINUED] ondansetron (ZOFRAN) IV, sodium chloride flush, sodium chloride flush  Micro Results Recent Results (from the past 240 hour(s))  MRSA PCR Screening     Status: None   Collection Time: 06/23/16  9:34 PM  Result Value Ref Range Status   MRSA by PCR NEGATIVE NEGATIVE Final     Comment:        The GeneXpert MRSA Assay (FDA approved for NASAL specimens only), is one component of a comprehensive MRSA colonization surveillance program. It is not intended to diagnose MRSA infection nor to guide or monitor treatment for MRSA infections.     Radiology Reports Ct Chest High Resolution  Result Date: 07/01/2016 CLINICAL DATA:  Shortness of breath. EXAM: CT CHEST WITHOUT CONTRAST TECHNIQUE: Multidetector CT imaging of the chest was performed following the standard protocol without intravenous contrast. High resolution imaging of the lungs, as well as inspiratory and expiratory imaging, was performed. COMPARISON:  06/22/2016 and 12/28/2011. FINDINGS: Cardiovascular: Atherosclerotic calcification of the arterial vasculature, including three-vessel involvement of the coronary arteries. Pulmonary arteries and heart are enlarged. No pericardial effusion. Mediastinum/Nodes: Mediastinal lymph nodes are chronically enlarged, measuring up to 2.2 cm in the low right paratracheal station, stable from 12/28/2011. Hilar regions are difficult to definitively evaluate without IV contrast. No axillary adenopathy. Esophagus is grossly unremarkable. Lungs/Pleura: Image quality is degraded by respiratory motion. Collapse/ consolidation in both lower lobes, new from 06/22/2016. Suspect residual mild septal thickening. No pleural fluid. Airway is unremarkable. Upper Abdomen: Visualized portions of the liver and right adrenal gland are unremarkable. Left adrenal calcification. Visualized portions of the spleen, stomach and bowel are grossly unremarkable. Musculoskeletal: No worrisome lytic or sclerotic lesions. Degenerative changes are seen in the spine. IMPRESSION: 1. New collapse/consolidation in both lower lobes, indicative of pneumonia and/or aspiration. 2. Probable residual mild pulmonary edema. 3. Chronic mediastinal adenopathy can be seen in the setting of congestive heart failure.  Sarcoid is  another consideration. 4. Enlarged pulmonary arteries, indicative of pulmonary arterial hypertension. 5. Aortic atherosclerosis (ICD10-170.0). Coronary artery calcification. Electronically Signed   By: Lorin Picket M.D.   On: 07/01/2016 09:33   Dg Chest Port 1 View  Result Date: 07/01/2016 CLINICAL DATA:  Shortness of breath. EXAM: PORTABLE CHEST 1 VIEW COMPARISON:  CT chest 07/01/2016.  Chest x-ray 07/01/2015. FINDINGS: Mediastinum and hilar structures are normal. Cardiomegaly with normal pulmonary vascularity. Bibasilar atelectasis and infiltrates are present. Small bilateral pleural effusions. No pneumothorax. IMPRESSION: 1. Persistent bibasilar atelectasis and infiltrates. No significant change. Small bilateral pleural effusions noted. 2. Stable cardiomegaly.  No pulmonary venous congestion. Electronically Signed   By: Marcello Moores  Register   On: 07/01/2016 07:04   Dg Chest Portable 1 View  Result Date: 06/30/2016 CLINICAL DATA:  66 year old diabetic hypertensive male with pulmonary edema and shortness breath. Subsequent encounter. EXAM: PORTABLE CHEST 1 VIEW COMPARISON:  06/26/2016 and 12/28/2011 chest x-ray. 06/22/2016 chest CT. FINDINGS: Mild pulmonary vascular prominence superimposed upon chronic lung changes. Cardiomegaly. Mediastinal fullness consistent with CT detected adenopathy. Limited evaluation retrocardiac region. IMPRESSION: No significant change. Pulmonary vascular prominence superimposed upon chronic changes. Cardiomegaly. Mediastinal adenopathy as best demonstrated on recent chest CT. Please see CT report. Electronically Signed   By: Genia Del M.D.   On: 06/30/2016 06:55   Dg Chest Portable 1 View  Result Date: 06/26/2016 CLINICAL DATA:  Respiratory failure. EXAM: PORTABLE CHEST 1 VIEW COMPARISON:  06/23/2016.  CT 01/01/ 2018. FINDINGS: Persistent mediastinal fullness noted consistent with previously identified adenopathy. Cardiomegaly with diffuse bilateral from interstitial  prominence and bilateral pleural effusions consistent congestive heart failure. Progressive bibasilar atelectasis. No pneumothorax . IMPRESSION: 1. Persistent mediastinal fullness consistent previously identified adenopathy. 2. Cardiomegaly with diffuse bilateral pulmonary interstitial prominence and small bilateral pleural effusions consistent with congestive heart failure. 3.  Progressive bibasilar atelectasis . Electronically Signed   By: Marcello Moores  Register   On: 06/26/2016 06:49   Dg Chest Port 1 View  Result Date: 06/24/2016 CLINICAL DATA:  Shortness of breath.  History of diabetes, CHF. EXAM: PORTABLE CHEST 1 VIEW COMPARISON:  CT chest June 22, 2016 FINDINGS: Cardiac silhouette is moderately enlarged, unchanged. Mediastinal silhouette is nonsuspicious. Diffusely coarsened pulmonary interstitium. Increased lung volumes. No pleural effusion or focal consolidation. No pneumothorax. Soft tissue planes and included osseous structures are nonsuspicious. IMPRESSION: Stable cardiomegaly. Probable COPD, in addition to diffuse coarsened pulmonary interstitium better characterized on yesterday's CT chest. Electronically Signed   By: Elon Alas M.D.   On: 06/24/2016 02:19    Time Spent in minutes  35   Louellen Molder M.D on 07/02/2016 at 11:53 AM  Between 7am to 7pm - Pager - [phone]  After 7pm go to [email or web address] - password [facility]  [facility] -  Office  [phone]

## 2016-07-02 NOTE — Progress Notes (Signed)
UNNA boots removed per patient request and okay'd by NP Clegg.

## 2016-07-02 NOTE — Progress Notes (Signed)
Placed patient on Bipap for the night with IPAP set at 18cm and EPAP set at 8cm. Oxygen set at 60% with SP02=93%.

## 2016-07-03 ENCOUNTER — Other Ambulatory Visit: Payer: Self-pay | Admitting: *Deleted

## 2016-07-03 ENCOUNTER — Inpatient Hospital Stay (HOSPITAL_COMMUNITY): Payer: Managed Care, Other (non HMO)

## 2016-07-03 DIAGNOSIS — Z720 Tobacco use: Secondary | ICD-10-CM | POA: Diagnosis present

## 2016-07-03 DIAGNOSIS — Z794 Long term (current) use of insulin: Secondary | ICD-10-CM

## 2016-07-03 DIAGNOSIS — I2721 Secondary pulmonary arterial hypertension: Secondary | ICD-10-CM | POA: Diagnosis present

## 2016-07-03 DIAGNOSIS — J9621 Acute and chronic respiratory failure with hypoxia: Secondary | ICD-10-CM | POA: Diagnosis present

## 2016-07-03 DIAGNOSIS — E0865 Diabetes mellitus due to underlying condition with hyperglycemia: Secondary | ICD-10-CM

## 2016-07-03 DIAGNOSIS — I255 Ischemic cardiomyopathy: Secondary | ICD-10-CM | POA: Diagnosis present

## 2016-07-03 DIAGNOSIS — E662 Morbid (severe) obesity with alveolar hypoventilation: Secondary | ICD-10-CM

## 2016-07-03 LAB — BASIC METABOLIC PANEL
Anion gap: 13 (ref 5–15)
BUN: 76 mg/dL — AB (ref 6–20)
CHLORIDE: 101 mmol/L (ref 101–111)
CO2: 26 mmol/L (ref 22–32)
CREATININE: 3.18 mg/dL — AB (ref 0.61–1.24)
Calcium: 8.6 mg/dL — ABNORMAL LOW (ref 8.9–10.3)
GFR calc Af Amer: 22 mL/min — ABNORMAL LOW (ref >60)
GFR calc non Af Amer: 19 mL/min — ABNORMAL LOW (ref >60)
Glucose, Bld: 107 mg/dL — ABNORMAL HIGH (ref 65–99)
POTASSIUM: 4.4 mmol/L (ref 3.5–5.1)
SODIUM: 140 mmol/L (ref 135–145)

## 2016-07-03 LAB — GLUCOSE, CAPILLARY
Glucose-Capillary: 88 mg/dL (ref 65–99)
Glucose-Capillary: 198 mg/dL — ABNORMAL HIGH (ref 65–99)
Glucose-Capillary: 165 mg/dL — ABNORMAL HIGH (ref 65–99)

## 2016-07-03 MED ORDER — CARVEDILOL 25 MG PO TABS
25.0000 mg | ORAL_TABLET | Freq: Two times a day (BID) | ORAL | Status: DC
  Administered 2016-07-03 (×2): 25 mg via ORAL
  Filled 2016-07-03 (×2): qty 1

## 2016-07-03 MED ORDER — TORSEMIDE 20 MG PO TABS: 40.0000 mg | ORAL_TABLET | Freq: Every day | ORAL | 0 refills | Status: DC

## 2016-07-03 MED ORDER — CARVEDILOL 25 MG PO TABS: 25.0000 mg | ORAL_TABLET | Freq: Two times a day (BID) | ORAL | 0 refills | Status: DC

## 2016-07-03 MED ORDER — ISOSORB DINITRATE-HYDRALAZINE 20-37.5 MG PO TABS: 2.0000 | ORAL_TABLET | Freq: Three times a day (TID) | ORAL | 0 refills | Status: DC

## 2016-07-03 MED ORDER — NICOTINE 21 MG/24HR TD PT24: 21.0000 mg | MEDICATED_PATCH | Freq: Every day | TRANSDERMAL | 0 refills | Status: AC

## 2016-07-03 NOTE — Progress Notes (Signed)
Patient wanted to remove BIPAP for the night. Placed patient on high flow canula set at 11 lpm

## 2016-07-03 NOTE — Progress Notes (Addendum)
Not seen today but d/w DR Dhungal   - ok to taper off steroids due to low prob for sarcoid  - opd set up for OSA eval and fu - accept lower pulse ox - he is  Tolerating this.   pccm will sign off. FU made  Future Appointments Date Time Provider [place]  07/17/2016 9:40 AM [place] None  07/20/2016 10:30 AM Magdalen Spatz, NP [facility] None  08/26/2016 10:45 AM Rigoberto Noel, MD [facility] None  09/03/2016 10:45 AM Renato Shin, MD [facility] None      Dr. Brand Males, M.D., [facility].C.P Pulmonary and Critical Care Medicine Staff Physician [place] [facility] Pager: [phone], If no answer or between  15:00h - 7:00h: call [phone]  07/03/2016 12:13 PM

## 2016-07-03 NOTE — Discharge Summary (Addendum)
Physician Discharge Summary  GOODWIN KAMPHAUS QQP:[id number] DOB: Jun 21, 1951 DOA: 06/23/2016  PCP: Manon Hilding, MD  Admit date: 06/23/2016 Discharge date: 07/03/2016  Admitted From: Home Disposition:  Home with home health  Recommendations for Outpatient Follow-up:  1. Follow-up with PCP in 1 week. 2. Outpatient follow-up in the heart failure clinic and pulmonary (scheduled) 3. Nephrology (Dr. Posey Pronto) Will arrange outpatient follow-up.  Home Health: RN and PT Equipment/Devices: High flow oxygen (15 L via nasal cannula continuously)  Discharge Condition: Guarded CODE STATUS: Full code Diet recommendation: Heart Healthy (with fluid restrictions of 1500 mL a day) / Carb Modified   Discharge weight : 133.1KG ( 293LB)   Discharge Diagnoses:  Principal Problem:   Acute on chronic respiratory failure with hypoxia (HCC)   Active Problems:   Acute renal failure superimposed on stage 4 chronic kidney disease (HCC)   Coronary artery disease involving native coronary artery of native heart without angina pectoris   Diabetes mellitus type 2 with peripheral artery disease (HCC)   Acute on chronic respiratory failure (HCC)   Acute systolic CHF (congestive heart failure) (HCC)   Hypertensive urgency   Acute CHF (congestive heart failure) (HCC)   Cardiomyopathy ([place])   Pulmonary edema   Diabetes mellitus due to underlying condition, uncontrolled, with hyperglycemia, with long-term current use of insulin (HCC)   Morbid obesity ([place])   Obesity hypoventilation syndrome (HCC)   Cardiomyopathy, ischemic   PAH (pulmonary artery hypertension)   Tobacco abuse  Brief narrative/history of present illness   please refer to admission H&P for details, in brief, 66 year old morbidly obese male with history of COPD, ongoing tobacco use, type 2 diabetes mellitus, CAD status post? Stenting per patient presented to the ED ethmoid hospital with shortness of breath on 06/21/2016. 2-D echo done there showed EF of  15% and he was transferred to [facility] for cardiology evaluation. CT of the chest showed groundglass opacity with concern for sarcoid versus edema. Patient underwent right heart catheterization on 1/5 showing pulmonary hypertension and placed on diuresis with improvement. Pulmonary following.  Hospital course  Principal Problem:   Acute respiratory failure with hypoxia (HCC) - secondary to acute biventricular CHF (EF 15-20% with grade 2 diastolic dysfunction), Pulmonary hypertension, COPD and OSA/OHS.Marland Kitchen HRCT negative for interstitial lung disease or sarcoid. Diuresed quite well (negative balance of 23 L since admission). Reduced torsemide dose. Still requiring high flow O2 via nasal cannula. (15 L via nasal cannula continuously). -Needs strict I/O monitoring and fluid restriction at home. Pulmonary discussed options for tracheostomy with patient. He does not want tracheostomy at present and will discuss further during outpatient visit. -Has been requiring BiPAP at night. -He will be discharged on high flow nasal cannula and it will be expected that his sats may remain low. -Continue home nebulizer.  Pulmonary have arranged outpatient follow-up and sleep study.      Active Problems:   Acute  systolic and diastolic CHF (congestive heart failure) (HCC) Negative balance of 23 L since admission. Torsemide dose reduced. Renal function starting to improve.  RHC showing moderate to severe PAH (group 3) . No evidence of left to right shunt. Increased Coreg dose. canot use ACEi/ ARB and Aldactone due to impaired renal function. Continue BiDil. -Suspecting this is ischemic cardiomyopathy. Cardiology recommend eventual ischemic workup once renal function improved.  Patient will be discharged on torsemide, BiDil and Coreg. Follow-up in heart failure clinic  Severe pulmonary artery hypertension Suspect primary group 3 due to OSA/OHS. Recommends that  Acute renal failure superimposed  on stage 3- 4 chronic kidney disease ([place]) -Suspect cardiorenal etiology. Possibly has some underlying chronic kidney disease.  Last creatinine in the system one year back of 1.88. Renal ultrasound was unremarkable. Does not see a nephrologist. HCTZ and enalapril have been discontinued. Instructed to avoid NSAIDs. I have discussed with nephrologist Dr. Posey Pronto who will arrange outpatient appointment to establish care    Coronary artery disease involving native coronary artery of native heart without angina pectoris Continue aspirin, beta blocker (metoprolol switched to Coreg), and statin. Needs ischemic workup once renal function improved as outpatient.   Uncontrolled Diabetes mellitus type 2 with peripheral artery disease (HCC) CBG elevated due to steroid. Improved with Lantus dose adjusted. Follows with Dr. Loanne Drilling. Last A1c of 7.5 in December 2017. Resume home dose insulin.    Tobacco abuse Counseled on cessation. Pressure nicotine patch   Morbid obesity Ounces on weight loss.   History of gout Holding allopurinol. Asymptomatic.     Family Communication  : wife at bedside  Disposition Plan  :  home with home health  Consults  :   Cardiology (heart failure) Pulmonary  Procedures  :  Right heart catheterization (on 1/5 and 1/9) HRCT  Discharge Instructions   Allergies as of 07/03/2016   No Known Allergies     Medication List    STOP taking these medications   allopurinol 300 MG tablet Commonly known as:  ZYLOPRIM   enalapril 20 MG tablet Commonly known as:  VASOTEC   glucose blood test strip Commonly known as:  ONETOUCH VERIO   hydrochlorothiazide 25 MG tablet Commonly known as:  HYDRODIURIL   metoprolol succinate 100 MG 24 hr tablet Commonly known as:  TOPROL-XL     TAKE these medications   aspirin 325 MG tablet Take 325 mg by mouth daily.   BD INSULIN SYRINGE ULTRAFINE 31G X 5/16" 1 ML Misc Generic drug:  Insulin Syringe-Needle  U-100 USE 2 SYRINGES PER DAY   BD ULTRA-FINE PEN NEEDLES 29G X 12.7MM Misc Generic drug:  Insulin Pen Needle USE 1 PER DAY   B-D ULTRAFINE III SHORT PEN 31G X 8 MM Misc Generic drug:  Insulin Pen Needle USE 1 PEN NEEDLE DAILY   carvedilol 25 MG tablet Commonly known as:  COREG Take 1 tablet (25 mg total) by mouth 2 (two) times daily with a meal.   clopidogrel 75 MG tablet Commonly known as:  PLAVIX Take 75 mg by mouth daily.   insulin glargine 100 UNIT/ML injection Commonly known as:  LANTUS Inject 0.9 mLs (90 Units total) into the skin every morning.   insulin lispro 100 UNIT/ML KiwkPen Commonly known as:  HUMALOG KWIKPEN Inject 0.6 mLs (60 Units total) into the skin daily with supper. What changed:  how much to take   ipratropium-albuterol 0.5-2.5 (3) MG/3ML Soln Commonly known as:  DUONEB Inhale 3 mLs into the lungs 4 (four) times daily.   isosorbide-hydrALAZINE 20-37.5 MG tablet Commonly known as:  BIDIL Take 2 tablets by mouth 3 (three) times daily.   nicotine 21 mg/24hr patch Commonly known as:  NICODERM CQ - dosed in mg/24 hours Place 1 patch (21 mg total) onto the skin daily. Start taking on:  07/04/2016   simvastatin 20 MG tablet Commonly known as:  ZOCOR Take 20 mg by mouth at bedtime.   torsemide 20 MG tablet Commonly known as:  DEMADEX Take 2 tablets (40 mg total) by mouth daily. Start taking on:  07/04/2016  traZODone 100 MG tablet Commonly known as:  DESYREL Take 100 mg by mouth at bedtime. Reported on 07/18/2015            Durable Medical Equipment        Start     Ordered   07/03/16 1302  For home use only DME 4 wheeled rolling walker with seat  Once    Question:  Patient needs a walker to treat with the following condition  Answer:  Weakness   07/03/16 1302     Follow-up Information    Loralie Champagne, MD Follow up on 07/17/2016.   Specialty:  Cardiology Why:  Heart Failure Clinic located on the 1st floor. at [place] information: [place] [address] [facility]. Go on 07/20/2016.   Specialty:  Pulmonology Why:  Hospital Follow up with Eric Form, NP at 10:30 Contact information: [address] Ruston V., MD Follow up on 08/26/2016.   Specialty:  Pulmonary Disease Why:  Sleep Consultation for OSA  Appointment time 10:45 Contact information: [address] [place] [address] [phone]          No Known Allergies    Procedures/Studies: US Renal  Result Date: 07/03/2016 CLINICAL DATA:  Acute renal failure EXAM: RENAL / URINARY TRACT ULTRASOUND COMPLETE COMPARISON:  None. FINDINGS: Right Kidney: Length: 10.8 cm. Echogenicity and renal cortical thickness are within normal limits. No mass, perinephric fluid, or hydronephrosis visualized. No sonographically demonstrable calculus or ureterectasis. Left Kidney: Length: 11.9 cm. Echogenicity and renal cortical thickness are within normal limits. No perinephric fluid or hydronephrosis visualized. There is a cyst arising from the lower pole left kidney measuring 1.6 x 1.3 x 1.3 cm. No sonographically demonstrable calculus or ureterectasis. Bladder: Appears normal for degree of bladder distention. IMPRESSION: Small left renal cyst.  Study otherwise unremarkable. Electronically Signed   By: Lowella Grip III M.D.   On: 07/03/2016 08:54   Ct Chest High Resolution  Result Date: 07/01/2016 CLINICAL DATA:  Shortness of breath. EXAM: CT CHEST WITHOUT CONTRAST TECHNIQUE: Multidetector CT imaging of the chest was performed following the standard protocol without intravenous contrast. High resolution imaging of the lungs, as well as inspiratory and expiratory imaging, was performed. COMPARISON:  06/22/2016 and 12/28/2011. FINDINGS: Cardiovascular: Atherosclerotic calcification of the arterial vasculature, including  three-vessel involvement of the coronary arteries. Pulmonary arteries and heart are enlarged. No pericardial effusion. Mediastinum/Nodes: Mediastinal lymph nodes are chronically enlarged, measuring up to 2.2 cm in the low right paratracheal station, stable from 12/28/2011. Hilar regions are difficult to definitively evaluate without IV contrast. No axillary adenopathy. Esophagus is grossly unremarkable. Lungs/Pleura: Image quality is degraded by respiratory motion. Collapse/ consolidation in both lower lobes, new from 06/22/2016. Suspect residual mild septal thickening. No pleural fluid. Airway is unremarkable. Upper Abdomen: Visualized portions of the liver and right adrenal gland are unremarkable. Left adrenal calcification. Visualized portions of the spleen, stomach and bowel are grossly unremarkable. Musculoskeletal: No worrisome lytic or sclerotic lesions. Degenerative changes are seen in the spine. IMPRESSION: 1. New collapse/consolidation in both lower lobes, indicative of pneumonia and/or aspiration. 2. Probable residual mild pulmonary edema. 3. Chronic mediastinal adenopathy can be seen in the setting of congestive heart failure. Sarcoid is another consideration. 4. Enlarged pulmonary arteries, indicative of pulmonary arterial hypertension. 5. Aortic atherosclerosis (ICD10-170.0). Coronary artery calcification. Electronically  Signed   By: Lorin Picket M.D.   On: 07/01/2016 09:33   Dg Chest Port 1 View  Result Date: 07/01/2016 CLINICAL DATA:  Shortness of breath. EXAM: PORTABLE CHEST 1 VIEW COMPARISON:  CT chest 07/01/2016.  Chest x-ray 07/01/2015. FINDINGS: Mediastinum and hilar structures are normal. Cardiomegaly with normal pulmonary vascularity. Bibasilar atelectasis and infiltrates are present. Small bilateral pleural effusions. No pneumothorax. IMPRESSION: 1. Persistent bibasilar atelectasis and infiltrates. No significant change. Small bilateral pleural effusions noted. 2. Stable cardiomegaly.   No pulmonary venous congestion. Electronically Signed   By: Marcello Moores  Register   On: 07/01/2016 07:04   Dg Chest Portable 1 View  Result Date: 06/30/2016 CLINICAL DATA:  66 year old diabetic hypertensive male with pulmonary edema and shortness breath. Subsequent encounter. EXAM: PORTABLE CHEST 1 VIEW COMPARISON:  06/26/2016 and 12/28/2011 chest x-ray. 06/22/2016 chest CT. FINDINGS: Mild pulmonary vascular prominence superimposed upon chronic lung changes. Cardiomegaly. Mediastinal fullness consistent with CT detected adenopathy. Limited evaluation retrocardiac region. IMPRESSION: No significant change. Pulmonary vascular prominence superimposed upon chronic changes. Cardiomegaly. Mediastinal adenopathy as best demonstrated on recent chest CT. Please see CT report. Electronically Signed   By: Genia Del M.D.   On: 06/30/2016 06:55   Dg Chest Portable 1 View  Result Date: 06/26/2016 CLINICAL DATA:  Respiratory failure. EXAM: PORTABLE CHEST 1 VIEW COMPARISON:  06/23/2016.  CT 01/01/ 2018. FINDINGS: Persistent mediastinal fullness noted consistent with previously identified adenopathy. Cardiomegaly with diffuse bilateral from interstitial prominence and bilateral pleural effusions consistent congestive heart failure. Progressive bibasilar atelectasis. No pneumothorax . IMPRESSION: 1. Persistent mediastinal fullness consistent previously identified adenopathy. 2. Cardiomegaly with diffuse bilateral pulmonary interstitial prominence and small bilateral pleural effusions consistent with congestive heart failure. 3.  Progressive bibasilar atelectasis . Electronically Signed   By: Marcello Moores  Register   On: 06/26/2016 06:49   Dg Chest Port 1 View  Result Date: 06/24/2016 CLINICAL DATA:  Shortness of breath.  History of diabetes, CHF. EXAM: PORTABLE CHEST 1 VIEW COMPARISON:  CT chest June 22, 2016 FINDINGS: Cardiac silhouette is moderately enlarged, unchanged. Mediastinal silhouette is nonsuspicious. Diffusely  coarsened pulmonary interstitium. Increased lung volumes. No pleural effusion or focal consolidation. No pneumothorax. Soft tissue planes and included osseous structures are nonsuspicious. IMPRESSION: Stable cardiomegaly. Probable COPD, in addition to diffuse coarsened pulmonary interstitium better characterized on yesterday's CT chest. Electronically Signed   By: Elon Alas M.D.   On: 06/24/2016 02:19       Subjective: Still requiring high flow nasal cannula. Reports feeling okay and wanting to go home.  Discharge Exam: Vitals:   07/03/16 1204 07/03/16 1205  BP:    Pulse: 75 75  Resp: (!) 21 (!) 22  Temp:     Vitals:   07/03/16 1155 07/03/16 1204 07/03/16 1205 07/03/16 1254  BP: 124/69     Pulse: 74 75 75   Resp: (!) 22 (!) 21 (!) 22   Temp:      TempSrc:      SpO2: (!) 81% (!) 78% (!) 80% 90%  Weight:      Height:        Gen: morbidly obese, not in distress HEENT: moist mucosa, supple neck Chest: diminished bibasilar breath sounds CVS: N S1&S2, no murmurs,  GI: soft, NT, ND Musculoskeletal: warm, Trace pitting edema b/l,  CNS: Alert and oriented.    The results of significant diagnostics from this hospitalization (including imaging, microbiology, ancillary and laboratory) are listed below for reference.     Microbiology: Recent Results (from  the past 240 hour(s))  MRSA PCR Screening     Status: None   Collection Time: 06/23/16  9:34 PM  Result Value Ref Range Status   MRSA by PCR NEGATIVE NEGATIVE Final    Comment:        The GeneXpert MRSA Assay (FDA approved for NASAL specimens only), is one component of a comprehensive MRSA colonization surveillance program. It is not intended to diagnose MRSA infection nor to guide or monitor treatment for MRSA infections.      Labs: BNP (last 3 results)  Recent Labs  06/23/16 2326  BNP 016.0*   Basic Metabolic Panel:  Recent Labs Lab 06/30/16 0253 06/30/16 2231 07/01/16 0218 07/02/16 0300  07/03/16 0313  NA 138 134* 137 136 140  K 4.1 4.5 4.2 4.4 4.4  CL 98* 96* 98* 98* 101  CO2 _0 GLUCOSE 165* 538* 345* 199* 107*  BUN 70* 74* 75* 76* 76*  CREATININE 3.13* 3.17* 3.04* 2.91* 3.18*  CALCIUM 8.5* 8.2* 8.3* 8.4* 8.6*   Liver Function Tests: No results for input(s): AST, ALT, ALKPHOS, BILITOT, PROT, ALBUMIN in the last 168 hours. No results for input(s): LIPASE, AMYLASE in the last 168 hours. No results for input(s): AMMONIA in the last 168 hours. CBC:  Recent Labs Lab 06/29/16 0335 07/01/16 0218  WBC 9.2 13.0*  HGB 15.5 14.6  HCT 45.5 44.1  MCV 79.1 78.9  PLT 218 255   Cardiac Enzymes: No results for input(s): CKTOTAL, CKMB, CKMBINDEX, TROPONINI in the last 168 hours. BNP: Invalid input(s): POCBNP CBG:  Recent Labs Lab 07/02/16 1237 07/02/16 1653 07/02/16 2137 07/03/16 0757 07/03/16 1151  GLUCAP 143* 255* 195* 88 198*   D-Dimer No results for input(s): DDIMER in the last 72 hours. Hgb A1c No results for input(s): HGBA1C in the last 72 hours. Lipid Profile No results for input(s): CHOL, HDL, LDLCALC, TRIG, CHOLHDL, LDLDIRECT in the last 72 hours. Thyroid function studies No results for input(s): TSH, T4TOTAL, T3FREE, THYROIDAB in the last 72 hours.  Invalid input(s): FREET3 Anemia work up No results for input(s): VITAMINB12, FOLATE, FERRITIN, TIBC, IRON, RETICCTPCT in the last 72 hours. Urinalysis    Component Value Date/Time   COLORURINE STRAW (A) 06/24/2016 0455   APPEARANCEUR CLEAR 06/24/2016 0455   LABSPEC 1.008 06/24/2016 0455   PHURINE 6.0 06/24/2016 0455   GLUCOSEU 150 (A) 06/24/2016 0455   HGBUR SMALL (A) 06/24/2016 0455   BILIRUBINUR NEGATIVE 06/24/2016 0455   KETONESUR NEGATIVE 06/24/2016 0455   PROTEINUR >=300 (A) 06/24/2016 0455   NITRITE NEGATIVE 06/24/2016 0455   LEUKOCYTESUR NEGATIVE 06/24/2016 0455   Sepsis Labs Invalid input(s): PROCALCITONIN,  WBC,  LACTICIDVEN Microbiology Recent Results (from the past  240 hour(s))  MRSA PCR Screening     Status: None   Collection Time: 06/23/16  9:34 PM  Result Value Ref Range Status   MRSA by PCR NEGATIVE NEGATIVE Final    Comment:        The GeneXpert MRSA Assay (FDA approved for NASAL specimens only), is one component of a comprehensive MRSA colonization surveillance program. It is not intended to diagnose MRSA infection nor to guide or monitor treatment for MRSA infections.      Time coordinating discharge: Over 30 minutes  SIGNED:   Louellen Molder, MD  [facility] 07/03/2016, 1:11 PM Pager   If 7PM-7AM, please contact night-coverage [email or web address] Password [email or web address]

## 2016-07-03 NOTE — Progress Notes (Signed)
CM spoke with [employer] rep./ Santiago Glad _[phone] and was informed portable oxygen tank will be delivered to hospital for pt's transportation to home. Santiago Glad stated once pt arrives to home [employer] will deliver and setup liquid  oxygen for pt. CM made pt /wife and nurse aware. Whitman Hero RN,BSN,CM

## 2016-07-03 NOTE — Progress Notes (Signed)
Spoke with Levada Dy in case management and notified MD of need for home O2 prescription. Pt and family aware of increased home O2 needs prior to discharge.

## 2016-07-03 NOTE — Care Management Note (Addendum)
Case Management Note  Patient Details  Name: Gabriel Alvarez MRN: [id number] Date of Birth: 1951-03-17  Subjective/Objective:                 Pt with history of CKD stage III, DM, HTN, CAD presented to [facility] with acute CHF.  Echo was done showing EF 15-20%, he was hypoxic requiring Bipap.  Creatinine elevated, ?chronicity as no recent prior BMET.  Transferred to [facility] for management.    Action/Plan: Plan to d/c to home today with oxygen.  Expected Discharge Date:   07/03/2016           Expected Discharge Plan:  Home/Self Care  In-House Referral:     Discharge planning Services     Post Acute Care Choice:  Resumption of Svcs/PTA Provider Choice offered to:  Patient  DME Arranged:  Oxygen, rolator([facility]@ [phone] referral made per CM.  DME Agency:   [employer]/ CM made referral with [facility] @ [phone] for high liter flow oxygen. CM faxed VS(O2 saturations) and prescription to [facility] [phone] for high flow liquid oxygen.  HH Arranged:   RN/ Heart Failure follow up care [facility] Agency:   [facility] to arrange home health RN. CM faxed HHRN order to [phone]. Contact # [phone].  Status of Service:  COMPLETED   If discussed at Long Length of Stay Meetings, dates discussed:    Additional Comments:  Sharin Mons, RN 07/03/2016, 12:51 PM

## 2016-07-03 NOTE — Progress Notes (Signed)
[employer] rep./ Magda Paganini called CM and confimed portable oxygen tank will be delivered to pt's bedside prior to d/c. IF there are any concern please call Magda Paganini @ [phone]. Whitman Hero  RN,BSN,CM

## 2016-07-03 NOTE — Progress Notes (Signed)
Patient ID: Gabriel Alvarez, male   DOB: April 03, 1951, 66 y.o.   MRN: [id number]   ADVANCED HF ROUNDING NOTE  SUBJECTIVE:   Underwent RHC on 1/5. 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    Remains on torsemide. Weight pending.  SBP 140-150s.   Still on high flow oxygen.   Frustrated he wants to go today.    RHC 1/5 RA mean 12 RV 73/16 PA 80/33, mean 50 PCWP mean 18 Cardiac Output (Fick) 7.31  Cardiac Index (Fick) 2.89 PVR: 4.4 WU  RHC 1/9 RA mean 3 RV 70/6 PA 78/21, mean 45 PCWP mean 7 Oxygen saturations: PA 72% RV 72% RA 69% AO 88% Cardiac Output (Fick) 9.82  Cardiac Index (Fick) 3.84 PVR 3.9 WU  Scheduled Meds: . aspirin  81 mg Oral Daily  . bisacodyl  10 mg Rectal Once  . budesonide (PULMICORT) nebulizer solution  0.5 mg Nebulization BID  . carvedilol  18.75 mg Oral BID WC  . clopidogrel  75 mg Oral Daily  . heparin  5,000 Units Subcutaneous Q8H  . insulin aspart  0-15 Units Subcutaneous TID WC  . insulin aspart  0-5 Units Subcutaneous QHS  . insulin aspart  16 Units Subcutaneous TID WC  . insulin glargine  70 Units Subcutaneous BID  . ipratropium-albuterol  3 mL Nebulization QID  . isosorbide-hydrALAZINE  2 tablet Oral TID  . nicotine  21 mg Transdermal Daily  . simvastatin  20 mg Oral QHS  . sodium chloride flush  3 mL Intravenous Q12H  . torsemide  40 mg Oral Daily   Continuous Infusions: PRN Meds:.sodium chloride, albuterol, hydrALAZINE, ondansetron (ZOFRAN) IV, ondansetron **OR** [DISCONTINUED] ondansetron (ZOFRAN) IV, sodium chloride flush    Vitals:   07/03/16 0326 07/03/16 0400 07/03/16 0455 07/03/16 0505  BP:   (!) 143/75   Pulse: 79  (!) 34   Resp: 18 (!) 23 (!) 22 19  Temp:  98.4 F (36.9 C)    TempSrc:  Oral    SpO2: 96%  (!) 80% 93%  Weight:      Height:        Intake/Output Summary (Last 24 hours) at 07/03/16 0728 Last data filed at 07/03/16 0700  Gross per 24 hour  Intake             1320 ml  Output             2125 ml  Net             -805  ml    LABS: Basic Metabolic Panel:  Recent Labs  07/02/16 0300 07/03/16 0313  NA 136 140  K 4.4 4.4  CL 98* 101  CO2 28 26  GLUCOSE 199* 107*  BUN 76* 76*  CREATININE 2.91* 3.18*  CALCIUM 8.4* 8.6*   Liver Function Tests: No results for input(s): AST, ALT, ALKPHOS, BILITOT, PROT, ALBUMIN in the last 72 hours. No results for input(s): LIPASE, AMYLASE in the last 72 hours. CBC:  Recent Labs  07/01/16 0218  WBC 13.0*  HGB 14.6  HCT 44.1  MCV 78.9  PLT 255   Cardiac Enzymes: No results for input(s): CKTOTAL, CKMB, CKMBINDEX, TROPONINI in the last 72 hours. BNP: Invalid input(s): POCBNP D-Dimer: No results for input(s): DDIMER in the last 72 hours. Hemoglobin A1C: No results for input(s): HGBA1C in the last 72 hours. Fasting Lipid Panel: No results for input(s): CHOL, HDL, LDLCALC, TRIG, CHOLHDL, LDLDIRECT in the last 72 hours. Thyroid Function Tests: No results for  input(s): TSH, T4TOTAL, T3FREE, THYROIDAB in the last 72 hours.  Invalid input(s): FREET3 Anemia Panel: No results for input(s): VITAMINB12, FOLATE, FERRITIN, TIBC, IRON, RETICCTPCT in the last 72 hours.  RADIOLOGY: Ct Chest High Resolution  Result Date: 07/01/2016 CLINICAL DATA:  Shortness of breath. EXAM: CT CHEST WITHOUT CONTRAST TECHNIQUE: Multidetector CT imaging of the chest was performed following the standard protocol without intravenous contrast. High resolution imaging of the lungs, as well as inspiratory and expiratory imaging, was performed. COMPARISON:  06/22/2016 and 12/28/2011. FINDINGS: Cardiovascular: Atherosclerotic calcification of the arterial vasculature, including three-vessel involvement of the coronary arteries. Pulmonary arteries and heart are enlarged. No pericardial effusion. Mediastinum/Nodes: Mediastinal lymph nodes are chronically enlarged, measuring up to 2.2 cm in the low right paratracheal station, stable from 12/28/2011. Hilar regions are difficult to definitively evaluate  without IV contrast. No axillary adenopathy. Esophagus is grossly unremarkable. Lungs/Pleura: Image quality is degraded by respiratory motion. Collapse/ consolidation in both lower lobes, new from 06/22/2016. Suspect residual mild septal thickening. No pleural fluid. Airway is unremarkable. Upper Abdomen: Visualized portions of the liver and right adrenal gland are unremarkable. Left adrenal calcification. Visualized portions of the spleen, stomach and bowel are grossly unremarkable. Musculoskeletal: No worrisome lytic or sclerotic lesions. Degenerative changes are seen in the spine. IMPRESSION: 1. New collapse/consolidation in both lower lobes, indicative of pneumonia and/or aspiration. 2. Probable residual mild pulmonary edema. 3. Chronic mediastinal adenopathy can be seen in the setting of congestive heart failure. Sarcoid is another consideration. 4. Enlarged pulmonary arteries, indicative of pulmonary arterial hypertension. 5. Aortic atherosclerosis (ICD10-170.0). Coronary artery calcification. Electronically Signed   By: Lorin Picket M.D.   On: 07/01/2016 09:33   Dg Chest Port 1 View  Result Date: 07/01/2016 CLINICAL DATA:  Shortness of breath. EXAM: PORTABLE CHEST 1 VIEW COMPARISON:  CT chest 07/01/2016.  Chest x-ray 07/01/2015. FINDINGS: Mediastinum and hilar structures are normal. Cardiomegaly with normal pulmonary vascularity. Bibasilar atelectasis and infiltrates are present. Small bilateral pleural effusions. No pneumothorax. IMPRESSION: 1. Persistent bibasilar atelectasis and infiltrates. No significant change. Small bilateral pleural effusions noted. 2. Stable cardiomegaly.  No pulmonary venous congestion. Electronically Signed   By: Marcello Moores  Register   On: 07/01/2016 07:04   Dg Chest Portable 1 View  Result Date: 06/30/2016 CLINICAL DATA:  65 year old diabetic hypertensive male with pulmonary edema and shortness breath. Subsequent encounter. EXAM: PORTABLE CHEST 1 VIEW COMPARISON:  06/26/2016  and 12/28/2011 chest x-ray. 06/22/2016 chest CT. FINDINGS: Mild pulmonary vascular prominence superimposed upon chronic lung changes. Cardiomegaly. Mediastinal fullness consistent with CT detected adenopathy. Limited evaluation retrocardiac region. IMPRESSION: No significant change. Pulmonary vascular prominence superimposed upon chronic changes. Cardiomegaly. Mediastinal adenopathy as best demonstrated on recent chest CT. Please see CT report. Electronically Signed   By: Genia Del M.D.   On: 06/30/2016 06:55   Dg Chest Portable 1 View  Result Date: 06/26/2016 CLINICAL DATA:  Respiratory failure. EXAM: PORTABLE CHEST 1 VIEW COMPARISON:  06/23/2016.  CT 01/01/ 2018. FINDINGS: Persistent mediastinal fullness noted consistent with previously identified adenopathy. Cardiomegaly with diffuse bilateral from interstitial prominence and bilateral pleural effusions consistent congestive heart failure. Progressive bibasilar atelectasis. No pneumothorax . IMPRESSION: 1. Persistent mediastinal fullness consistent previously identified adenopathy. 2. Cardiomegaly with diffuse bilateral pulmonary interstitial prominence and small bilateral pleural effusions consistent with congestive heart failure. 3.  Progressive bibasilar atelectasis . Electronically Signed   By: Marcello Moores  Register   On: 06/26/2016 06:49   Dg Chest Port 1 View  Result Date: 06/24/2016 CLINICAL DATA:  Shortness of breath.  History of diabetes, CHF. EXAM: PORTABLE CHEST 1 VIEW COMPARISON:  CT chest June 22, 2016 FINDINGS: Cardiac silhouette is moderately enlarged, unchanged. Mediastinal silhouette is nonsuspicious. Diffusely coarsened pulmonary interstitium. Increased lung volumes. No pleural effusion or focal consolidation. No pneumothorax. Soft tissue planes and included osseous structures are nonsuspicious. IMPRESSION: Stable cardiomegaly. Probable COPD, in addition to diffuse coarsened pulmonary interstitium better characterized on yesterday's CT  chest. Electronically Signed   By: Elon Alas M.D.   On: 06/24/2016 02:19    PHYSICAL EXAM General: NAD, obese. Sitting in chair NAD.  Neck: Thick, JVP difficult but does not appear elevated, no thyromegaly or thyroid nodule.  Lungs: Distant breath sounds. On 12 liters oxygen.  CV: Nondisplaced PMI.  Heart regular S1/S2, no S3/S4, no murmur.  1-2+ edema to knees.  Abdomen: markedly obese.Soft, nontender, + distention.  Neurologic: Alert and oriented x 3.  Psych: Normal affect. Extremities: No clubbing or cyanosis. Warm  TELEMETRY: Reviewed telemetry pt in NSR  ASSESSMENT AND PLAN: 66 yo with history of CKD stage III, DM, HTN, CAD presented to [facility] with acute CHF.  Echo was done showing EF 15-20%, he was hypoxic requiring Bipap.  Creatinine elevated, ?chronicity as no recent prior BMET.  Transferred to [facility] for management.  1. Acute systolic CHF: Uncertain chronicity of low EF.  Echo (1/18) at [facility] with EF 15-20%, moderate LVH.  Last echo in 2013 had normal EF.  History of CAD, so possible ischemic cardiomyopathy.  Also may have sarcoid based on CT chest at [facility].  Volume status improved with diuresis, now euvolemic. He remains on high flow oxygen by nasal cannula. [facility] 06/30/16 with moderate to severe PAH (likely group 3).  No evidence for left to right shunt.  Filling pressures normal and normal cardiac output.  Creatinine elevated but stable.  - Continue torsemide 40 mg daily.  - refused ted hose. - Continue Bidil 2 tabs tid.  - Increase Coreg to 25 mg twice a day.   - No ACEI/ARB/ARNI/spironolactone with elevated creatinine.  - Will need eventual ischemic workup but creatinine currently prohibitive in absence of ACS.  2. CAD: History of remote mid RCA stent.  Last cath in 2005 with 50% in-stent restenosis in RCA.  Mild stable troponin elevation likely demand ischemia/volume overload.  - Continue ASA, Plavix, statin.  - Will need eventual ischemic workup but creatinine  currently prohibitive in absence of ACS.  3. ?AKI on CKD stage III: Most recent prior creatinine was 1.48 in 2014, so not sure of chronicity of elevated creatinine (acute versus chronic).  Creatinine leveled off at 3.18   4. Pulmonary:  Has abnormal chest CT, concern for sarcoidosis.  He has been started on prednisone.  Pulmonary following.  ACE level is not elevated. Ongoing hypoxemia, OHS/OSA plays a large role most likely.  Filling pressures normal on right heart cath on 1/9, so pulmonary edema is not playing a role at this point.  HRCT on 1/10 per Dr. Chase Caller was not indicative of sarcoidosis or ILD.  - He is on O2 by facemask today.  Need to get to a form of oxygen that he can go home on.  He refuses trach.  5. DM II: Per primary service.  6. Gout: Stable.  7. OSA/OHS: Suspected.  He will use Bipap at night and oxygen during the day.  8. Pulmonary HTN: Suspect primarily primarily group 3 due to OHS/OSA.  HRCT does not appear consistent with sarcoid or ILD.  9. Morbid obesity - needs weight loss.  Amy Clegg NP-C  07/03/2016 7:28 AM   Patient seen with NP, agree with the above note.  Stable from cardiac perspective, on torsemide.  Severe OHS/OSA.  Needs to find a way to get appropriate home oxygen so he can go home.  Does not want trach.  Getting very frustrated.   Loralie Champagne 07/03/2016 7:43 AM

## 2016-07-03 NOTE — Progress Notes (Signed)
Patient requested to be placed back on Bipap for the night

## 2016-07-03 NOTE — Discharge Instructions (Signed)
Acute Respiratory Failure, Adult Acute respiratory failure occurs when there is not enough oxygen passing from your lungs to your body. When this happens, your lungs have trouble removing carbon dioxide from the blood. This causes your blood oxygen level to drop too low as carbon dioxide builds up. Acute respiratory failure is a medical emergency. It can develop quickly, but it is temporary if treated promptly. Your lung capacity, or how much air your lungs can hold, may improve with time, exercise, and treatment. What are the causes? There are many possible causes of acute respiratory failure, including:  Lung injury.  Chest injury or damage to the ribs or tissues near the lungs.  Lung conditions that affect the flow of air and blood into and out of the lungs, such as pneumonia, acute respiratory distress syndrome, and cystic fibrosis.  Medical conditions, such as strokes or spinal cord injuries, that affect the muscles and nerves that control breathing.  Blood infection (sepsis).  Inflammation of the pancreas (pancreatitis).  A blood clot in the lungs (pulmonary embolism).  A large-volume blood transfusion.  Burns.  Near-drowning.  Seizure.  Smoke inhalation.  Reaction to medicines.  Alcohol or drug overdose. What increases the risk? This condition is more likely to develop in people who have:  A blocked airway.  Asthma.  A condition or disease that damages or weakens the muscles, nerves, bones, or tissues that are involved in breathing.  A serious infection.  A health problem that blocks the unconscious reflex that is involved in breathing, such as hypothyroidism or sleep apnea.  A lung injury or trauma. What are the signs or symptoms? Trouble breathing is the main symptom of acute respiratory failure. Symptoms may also include:  Rapid breathing.  Restlessness or anxiety.  Skin, lips, or fingernails that appear blue (cyanosis).  Rapid heart rate.  Abnormal  heart rhythms (arrhythmias).  Confusion or changes in behavior.  Tiredness or loss of energy.  Feeling sleepy or having a loss of consciousness. How is this diagnosed? Your health care provider can diagnose acute respiratory failure with a medical history and physical exam. During the exam, your health care provider will listen to your heart and check for crackling or wheezing sounds in your lungs. Your may also have tests to confirm the diagnosis and determine what is causing respiratory failure. These tests may include:  Measuring the amount of oxygen in your blood (pulse oximetry). The measurement comes from a small device that is placed on your finger, earlobe, or toe.  Other blood tests to measure blood gases and to look for signs of infection.  Sampling your cerebral spinal fluid or tracheal fluid to check for infections.  Chest X-ray to look for fluid in spaces that should be filled with air.  Electrocardiogram (ECG) to look at the heart's electrical activity. How is this treated? Treatment for this condition usually takes places in a hospital intensive care unit (ICU). Treatment depends on what is causing the condition. It may include one or more treatments until your symptoms improve. Treatment may include:  Supplemental oxygen. Extra oxygen is given through a tube in the nose, a face mask, or a hood.  A device such as a continuous positive airway pressure (CPAP) or bi-level positive airway pressure (BiPAP or BPAP) machine. This treatment uses mild air pressure to keep the airways open. A mask or other device will be placed over your nose or mouth. A tube that is connected to a motor will deliver oxygen through the mask.  Ventilator. This treatment helps move air into and out of the lungs. This may be done with a bag and mask or a machine. For this treatment, a tube is placed in your windpipe (trachea) so air and oxygen can flow to the lungs.  Extracorporeal membrane oxygenation  (ECMO). This treatment temporarily takes over the function of the heart and lungs, supplying oxygen and removing carbon dioxide. ECMO gives the lungs a chance to recover. It may be used if a ventilator is not effective.  Tracheostomy. This is a procedure that creates a hole in the neck to insert a breathing tube.  Receiving fluids and medicines.  Rocking the bed to help breathing. Follow these instructions at home:  Take over-the-counter and prescription medicines only as told by your health care provider.  Return to normal activities as told by your health care provider. Ask your health care provider what activities are safe for you.  Keep all follow-up visits as told by your health care provider. This is important. How is this prevented? Treating infections and medical conditions that may lead to acute respiratory failure can help prevent the condition from developing. Contact a health care provider if:  You have a fever.  Your symptoms do not improve or they get worse. Get help right away if:  You are having trouble breathing.  You lose consciousness.  Your have cyanosis or turn blue.  You develop a rapid heart rate.  You are confused. These symptoms may represent a serious problem that is an emergency. Do not wait to see if the symptoms will go away. Get medical help right away. Call your local emergency services (911 in the [place] Do not drive yourself to the hospital.  This information is not intended to replace advice given to you by your health care provider. Make sure you discuss any questions you have with your health care provider. Document Released: 06/13/2013 Document Revised: 01/04/2016 Document Reviewed: 12/25/2015 Elsevier Interactive Patient Education  2017 [place].   Heart Failure Heart failure is a condition in which the heart has trouble pumping blood because it has become weak or stiff. This means that the heart does not pump blood efficiently for the  body to work well. For some people with heart failure, fluid may back up into the lungs and there may be swelling (edema) in the lower legs. Heart failure is usually a long-term (chronic) condition. It is important for you to take good care of yourself and follow the treatment plan from your health care provider. What are the causes? This condition is caused by some health problems, including:  High blood pressure (hypertension). Hypertension causes the heart muscle to work harder than normal. High blood pressure eventually causes the heart to become stiff and weak.  Coronary artery disease (CAD). CAD is the buildup of cholesterol and fat (plaques) in the arteries of the heart.  Heart attack (myocardial infarction). Injured tissue, which is caused by the heart attack, does not contract as well and the heart's ability to pump blood is weakened.  Abnormal heart valves. When the heart valves do not open and close properly, the heart muscle must pump harder to keep the blood flowing.  Heart muscle disease (cardiomyopathy or myocarditis). Heart muscle disease is damage to the heart muscle from a variety of causes, such as drug or alcohol abuse, infections, or unknown causes. These can increase the risk of heart failure.  Lung disease. When the lungs do not work properly, the heart must work harder.  What increases the risk? Risk of heart failure increases as a person ages. This condition is also more likely to develop in people who:  Are overweight.  Are male.  Smoke or chew tobacco.  Abuse alcohol or illegal drugs.  Have taken medicines that can damage the heart, such as chemotherapy drugs.  Have diabetes.  High blood sugar (glucose) is associated with high fat (lipid) levels in the blood.  Diabetes can also damage tiny blood vessels that carry nutrients to the heart muscle.  Have abnormal heart rhythms.  Have thyroid problems.  Have low blood counts (anemia). What are the signs or  symptoms? Symptoms of this condition include:  Shortness of breath with activity, such as when climbing stairs.  Persistent cough.  Swelling of the feet, ankles, legs, or abdomen.  Unexplained weight gain.  Difficulty breathing when lying flat (orthopnea).  Waking from sleep because of the need to sit up and get more air.  Rapid heartbeat.  Fatigue and loss of energy.  Feeling light-headed, dizzy, or close to fainting.  Loss of appetite.  Nausea.  Increased urination during the night (nocturia).  Confusion. How is this diagnosed? This condition is diagnosed based on:  Medical history, symptoms, and a physical exam.  Diagnostic tests, which may include:  Echocardiogram.  Electrocardiogram (ECG).  Chest X-ray.  Blood tests.  Exercise stress test.  Radionuclide scans.  Cardiac catheterization and angiogram. How is this treated? Treatment for this condition is aimed at managing the symptoms of heart failure. Medicines, behavioral changes, or other treatments may be necessary to treat heart failure. Medicines  These may include:  Angiotensin-converting enzyme (ACE) inhibitors. This type of medicine blocks the effects of a blood protein called angiotensin-converting enzyme. ACE inhibitors relax (dilate) the blood vessels and help to lower blood pressure.  Angiotensin receptor blockers (ARBs). This type of medicine blocks the actions of a blood protein called angiotensin. ARBs dilate the blood vessels and help to lower blood pressure.  Water pills (diuretics). Diuretics cause the kidneys to remove salt and water from the blood. The extra fluid is removed through urination, leaving a lower volume of blood that the heart has to pump.  Beta blockers. These improve heart muscle strength and they prevent the heart from beating too quickly.  Digoxin. This increases the force of the heartbeat. Healthy behavior changes  These may include:  Reaching and maintaining a  healthy weight.  Stopping smoking or chewing tobacco.  Eating heart-healthy foods.  Limiting or avoiding alcohol.  Stopping use of street drugs (illegal drugs).  Physical activity. Other treatments  These may include:  Surgery to open blocked coronary arteries or repair damaged heart valves.  Placement of a biventricular pacemaker to improve heart muscle function (cardiac resynchronization therapy). This device paces both the right ventricle and left ventricle.  Placement of a device to treat serious abnormal heart rhythms (implantable cardioverter defibrillator, or ICD).  Placement of a device to improve the pumping ability of the heart (left ventricular assist device, or LVAD).  Heart transplant. This can cure heart failure, and it is considered for certain patients who do not improve with other therapies. Follow these instructions at home: Medicines  Take over-the-counter and prescription medicines only as told by your health care provider. Medicines are important in reducing the workload of your heart, slowing the progression of heart failure, and improving your symptoms.  Do not stop taking your medicine unless your health care provider told you to do that.  Do not skip  any dose of medicine.  Refill your prescriptions before you run out of medicine. You need your medicines every day. Eating and drinking  Eat heart-healthy foods. Talk with a dietitian to make an eating plan that is right for you.  Choose foods that contain no trans fat and are low in saturated fat and cholesterol. Healthy choices include fresh or frozen fruits and vegetables, fish, lean meats, legumes, fat-free or low-fat dairy products, and whole-grain or high-fiber foods.  Limit salt (sodium) if directed by your health care provider. Sodium restriction may reduce symptoms of heart failure. Ask a dietitian to recommend heart-healthy seasonings.  Use healthy cooking methods instead of frying. Healthy  methods include roasting, grilling, broiling, baking, poaching, steaming, and stir-frying.  Limit your fluid intake if directed by your health care provider. Fluid restriction may reduce symptoms of heart failure. Lifestyle  Stop smoking or using chewing tobacco. Nicotine and tobacco can damage your heart and your blood vessels. Do not use nicotine gum or patches before talking to your health care provider.  Limit alcohol intake to no more than 1 drink per day for non-pregnant women and 2 drinks per day for men. One drink equals 12 oz of beer, 5 oz of wine, or 1 oz of hard liquor.  Drinking more than that is harmful to your heart. Tell your health care provider if you drink alcohol several times a week.  Talk with your health care provider about whether any level of alcohol use is safe for you.  If your heart has already been damaged by alcohol or you have severe heart failure, drinking alcohol should be stopped completely.  Stop use of illegal drugs.  Lose weight if directed by your health care provider. Weight loss may reduce symptoms of heart failure.  Do moderate physical activity if directed by your health care provider. People who are elderly and people with severe heart failure should consult with a health care provider for physical activity recommendations. Monitor important information  Weigh yourself every day. Keeping track of your weight daily helps you to notice excess fluid sooner.  Weigh yourself every morning after you urinate and before you eat breakfast.  Wear the same amount of clothing each time you weigh yourself.  Record your daily weight. Provide your health care provider with your weight record.  Monitor and record your blood pressure as told by your health care provider.  Check your pulse as told by your health care provider. Dealing with extreme temperatures  If the weather is extremely hot:  Avoid vigorous physical activity.  Use air conditioning or  fans or seek a cooler location.  Avoid caffeine and alcohol.  Wear loose-fitting, lightweight, and light-colored clothing.  If the weather is extremely cold:  Avoid vigorous physical activity.  Layer your clothes.  Wear mittens or gloves, a hat, and a scarf when you go outside.  Avoid alcohol. General instructions  Manage other health conditions such as hypertension, diabetes, thyroid disease, or abnormal heart rhythms as told by your health care provider.  Learn to manage stress. If you need help to do this, ask your health care provider.  Plan rest periods when fatigued.  Get ongoing education and support as needed.  Participate in or seek rehabilitation as needed to maintain or improve independence and quality of life.  Stay up to date with immunizations. Keeping current on pneumococcal and influenza immunizations is especially important to prevent respiratory infections.  Keep all follow-up visits as told by your health care  provider. This is important. Contact a health care provider if:  You have a rapid weight gain.  You have increasing shortness of breath that is unusual for you.  You are unable to participate in your usual physical activities.  You tire easily.  You cough more than normal, especially with physical activity.  You have any swelling or more swelling in areas such as your hands, feet, ankles, or abdomen.  You are unable to sleep because it is hard to breathe.  You feel like your heart is beating quickly (palpitations).  You become dizzy or light-headed when you stand up. Get help right away if:  You have difficulty breathing.  You notice or your family notices a change in your awareness, such as having trouble staying awake or having difficulty with concentration.  You have pain or discomfort in your chest.  You have an episode of fainting (syncope). This information is not intended to replace advice given to you by your health care  provider. Make sure you discuss any questions you have with your health care provider. Document Released: 06/08/2005 Document Revised: 02/11/2016 Document Reviewed: 01/01/2016 Elsevier Interactive Patient Education  2017 [employer].

## 2016-07-03 NOTE — Patient Outreach (Addendum)
[facility]) Care Management  07/03/2016  Gabriel Alvarez 1950/09/25 [id number]   Per Wilhemina Cash at [facility], she will be following patient for CM while in hospital, refer to internal [employer] CM program if needed post discharge, and no need for [facility] Care Management to contact patient at this time.   RNCM will close case due to other CM program and send case closure notification request to [facility]  Management.    Briann Sarchet H. Annia Friendly, BSN, [place] Management [facility] Telephonic CM Phone: [phone] Fax: [phone]

## 2016-07-03 NOTE — Progress Notes (Signed)
Physical Therapy Treatment Patient Details Name: Gabriel Alvarez MRN: [id number] DOB: February 15, 1951 Today's Date: 07/19/16    History of Present Illness Pt adm with Acute respiratory failure with hypoxia and acute systolic and diastolic heart failure. Echo showed EF 15%.  PMH - DM, copd, obesity, cad    PT Comments    Pt continues to improve with mobility and endurance. Plans to go home today. Recommend rollator for home so pt will be able to sit and rest when needed with ambulation.  Follow Up Recommendations  No PT follow up;Other (comment) (pulmonary rehab possibly in the future)     Equipment Recommendations  Other (comment) (rollator)    Recommendations for Other Services       Precautions / Restrictions Precautions Precautions: Fall Restrictions Weight Bearing Restrictions: No    Mobility  Bed Mobility               General bed mobility comments: Pt sitting up in chair  Transfers Overall transfer level: Modified independent Equipment used: Rolling walker (2 wheeled) Transfers: Sit to/from Stand Sit to Stand: Modified independent (Device/Increase time)            Ambulation/Gait Ambulation/Gait assistance: Supervision Ambulation Distance (Feet): 240 Feet Assistive device: Rolling walker (2 wheeled) Gait Pattern/deviations: Step-through pattern;Decreased step length - left;Decreased step length - right Gait velocity: decr Gait velocity interpretation: Below normal speed for age/gender General Gait Details: Verbal cues to stay closer to walker and stand more erect. Amb on 15L of O2 on high flow nasal cannula with SpO2 80-84%.   Stairs            Wheelchair Mobility    Modified Rankin (Stroke Patients Only)       Balance Overall balance assessment: Needs assistance Sitting-balance support: No upper extremity supported Sitting balance-Leahy Scale: Good     Standing balance support: No upper extremity supported;During functional  activity Standing balance-Leahy Scale: Fair                      Cognition Arousal/Alertness: Awake/alert Behavior During Therapy: WFL for tasks assessed/performed Overall Cognitive Status: Within Functional Limits for tasks assessed                      Exercises      General Comments        Pertinent Vitals/Pain Pain Assessment: No/denies pain    Home Living                      Prior Function            PT Goals (current goals can now be found in the care plan section) Progress towards PT goals: Progressing toward goals    Frequency    Min 3X/week      PT Plan Current plan remains appropriate    Co-evaluation             End of Session Equipment Utilized During Treatment: Oxygen Activity Tolerance: Patient tolerated treatment well Patient left: in chair;with call bell/phone within reach;with chair alarm set;with family/visitor present     Time: 1235-1250 PT Time Calculation (min) (ACUTE ONLY): 15 min  Charges:  $Gait Training: 8-22 mins                    G CodesShary Decamp [place] 07-19-16, 1:46 PM [employer] PT [phone]

## 2016-07-04 LAB — HEMOGLOBIN A1C
HEMOGLOBIN A1C: 8 % — AB (ref 4.8–5.6)
Mean Plasma Glucose: 183 mg/dL

## 2016-07-14 ENCOUNTER — Telehealth (HOSPITAL_COMMUNITY): Payer: Self-pay | Admitting: *Deleted

## 2016-07-14 NOTE — Telephone Encounter (Signed)
Patient's wife called triage line saying patient had gained 2 lbs since he was recently in the hospital. I called back and no answer and no option to leave voicemail.  Patient' does have an appointment with [place] this Friday.

## 2016-07-14 NOTE — Telephone Encounter (Signed)
Patient's wife returned call and she said he has actually gained 3 lbs since hospital on January 12th.  Patient is not having any shortness or breath or swelling.    I advised patient to discuss at his appointment this Friday and since he was not having any symptoms with the weight gain to continue taking his medications as prescribed and follow the low sodium, heart health diet.  I also advised him to call us if he does start to experience shortness of breath or swelling prior to his appointment Friday.

## 2016-07-17 ENCOUNTER — Ambulatory Visit (HOSPITAL_COMMUNITY)
Admission: RE | Admit: 2016-07-17 | Discharge: 2016-07-17 | Disposition: A | Discharge status: Home / Self Care | Payer: Managed Care, Other (non HMO) | Source: Ambulatory Visit | Attending: Cardiology | Admitting: Cardiology

## 2016-07-17 ENCOUNTER — Encounter (HOSPITAL_COMMUNITY): Payer: Self-pay

## 2016-07-17 VITALS — BP 130/72 | HR 82 | Wt 298.8 lb

## 2016-07-17 DIAGNOSIS — E7800 Pure hypercholesterolemia, unspecified: Secondary | ICD-10-CM | POA: Diagnosis not present

## 2016-07-17 DIAGNOSIS — Z7902 Long term (current) use of antithrombotics/antiplatelets: Secondary | ICD-10-CM | POA: Insufficient documentation

## 2016-07-17 DIAGNOSIS — I2510 Atherosclerotic heart disease of native coronary artery without angina pectoris: Secondary | ICD-10-CM | POA: Diagnosis not present

## 2016-07-17 DIAGNOSIS — Z794 Long term (current) use of insulin: Secondary | ICD-10-CM | POA: Insufficient documentation

## 2016-07-17 DIAGNOSIS — N184 Chronic kidney disease, stage 4 (severe): Secondary | ICD-10-CM | POA: Insufficient documentation

## 2016-07-17 DIAGNOSIS — Z9981 Dependence on supplemental oxygen: Secondary | ICD-10-CM | POA: Insufficient documentation

## 2016-07-17 DIAGNOSIS — J9611 Chronic respiratory failure with hypoxia: Secondary | ICD-10-CM | POA: Diagnosis not present

## 2016-07-17 DIAGNOSIS — I130 Hypertensive heart and chronic kidney disease with heart failure and stage 1 through stage 4 chronic kidney disease, or unspecified chronic kidney disease: Secondary | ICD-10-CM | POA: Diagnosis not present

## 2016-07-17 DIAGNOSIS — I5022 Chronic systolic (congestive) heart failure: Secondary | ICD-10-CM | POA: Diagnosis not present

## 2016-07-17 DIAGNOSIS — G4733 Obstructive sleep apnea (adult) (pediatric): Secondary | ICD-10-CM | POA: Insufficient documentation

## 2016-07-17 DIAGNOSIS — Z6841 Body Mass Index (BMI) 40.0 and over, adult: Secondary | ICD-10-CM | POA: Insufficient documentation

## 2016-07-17 DIAGNOSIS — Z79899 Other long term (current) drug therapy: Secondary | ICD-10-CM | POA: Insufficient documentation

## 2016-07-17 DIAGNOSIS — Z7982 Long term (current) use of aspirin: Secondary | ICD-10-CM | POA: Insufficient documentation

## 2016-07-17 DIAGNOSIS — I272 Pulmonary hypertension, unspecified: Secondary | ICD-10-CM | POA: Insufficient documentation

## 2016-07-17 DIAGNOSIS — E1122 Type 2 diabetes mellitus with diabetic chronic kidney disease: Secondary | ICD-10-CM | POA: Diagnosis not present

## 2016-07-17 LAB — CBC
HCT: 39.5 % (ref 39.0–52.0)
HEMOGLOBIN: 13 g/dL (ref 13.0–17.0)
MCH: 26 pg (ref 26.0–34.0)
MCHC: 32.9 g/dL (ref 30.0–36.0)
MCV: 79 fL (ref 78.0–100.0)
PLATELETS: 149 10*3/uL — AB (ref 150–400)
RBC: 5 MIL/uL (ref 4.22–5.81)
RDW: 15.8 % — ABNORMAL HIGH (ref 11.5–15.5)
WBC: 7.5 10*3/uL (ref 4.0–10.5)

## 2016-07-17 LAB — BASIC METABOLIC PANEL
ANION GAP: 8 (ref 5–15)
BUN: 34 mg/dL — ABNORMAL HIGH (ref 6–20)
CALCIUM: 8.5 mg/dL — AB (ref 8.9–10.3)
CO2: 26 mmol/L (ref 22–32)
Chloride: 107 mmol/L (ref 101–111)
Creatinine, Ser: 2.88 mg/dL — ABNORMAL HIGH (ref 0.61–1.24)
GFR calc Af Amer: 25 mL/min — ABNORMAL LOW (ref >60)
GFR, EST NON AFRICAN AMERICAN: 21 mL/min — AB (ref >60)
Glucose, Bld: 118 mg/dL — ABNORMAL HIGH (ref 65–99)
Potassium: 4.7 mmol/L (ref 3.5–5.1)
Sodium: 141 mmol/L (ref 135–145)

## 2016-07-17 LAB — LIPID PANEL
CHOL/HDL RATIO: 3.5 ratio
CHOLESTEROL: 177 mg/dL (ref 0–200)
HDL: 51 mg/dL (ref >40)
LDL Cholesterol: 99 mg/dL (ref 0–99)
Triglycerides: 133 mg/dL (ref <150)
VLDL: 27 mg/dL (ref 0–40)

## 2016-07-17 LAB — BRAIN NATRIURETIC PEPTIDE: B Natriuretic Peptide: 77.8 pg/mL (ref 0.0–100.0)

## 2016-07-17 MED ORDER — ASPIRIN EC 81 MG PO TBEC: 81.0000 mg | DELAYED_RELEASE_TABLET | Freq: Every day | ORAL | 3 refills | Status: AC

## 2016-07-17 MED ORDER — ISOSORBIDE MONONITRATE ER 120 MG PO TB24: 120.0000 mg | ORAL_TABLET | Freq: Every day | ORAL | Status: AC

## 2016-07-17 MED ORDER — HYDRALAZINE HCL 100 MG PO TABS: 100.0000 mg | ORAL_TABLET | Freq: Three times a day (TID) | ORAL | 3 refills | Status: DC

## 2016-07-17 NOTE — Patient Instructions (Signed)
Change Hydralazine to 100 mg Three times a day   Decrease Aspirin to 81 mg daily  Labs today  You have been referred to [facility], they will call you for an appointment  Your physician recommends that you schedule a follow-up appointment in: 2 weeks

## 2016-07-18 NOTE — Progress Notes (Signed)
PCP: Dr. Quintin Alto Cardiology: Dr. Aundra Dubin  66 yo with history of CAD, chronic systolic CHF, CKD stage IV, and OHS/OSA with chronic hypoxemic respiratory failure presents for hospital followup.  Patient had remote stent to RCA.  Recently, he was admitted to [facility] with acute on chronic hypoxemic respiratory failure and acute systolic CHF.  Echo was done showing EF 15-20%.  He was transferred to [facility].  He initially required Bipap.  He was diuresed and weaned to high flow oxygen by nasal cannula.  There was initial suspicion for sarcoidosis, but on review of CT chest the pulmonologist thought this was less likely.  Lurline Idol was recommended, but he ended up weaning to low enough oxygen that he was able to go home.  RHC prior to discharge showed normal filling pressures but severe pulmonary hypertension.  This is thought to be due to OHS/OSA. Creatinine was high, > 3 throughout hospitalization.    He currently is stable at home. Ankle swelling has gone down considerably but is still present.  He is on 6 L oxygen at home.  He can walk around the house without dyspnea but gets short of breath after walking about 100 yards. No chest pain.  He has not had sleep study yet to get CPAP or Bipap at home .   ECG: NSR, normal  Labs (1/18): K 4.4, creatinine 3.18  PMH: 1. OHS/OSA: Strong suspicion.  Has not yet had sleep study.  2. ?Sarcoidosis: Chronic mediastinal adenopathy on HRCT in 1/18 but no evidence of parenchymal lung sarcoidosis or ILD per pulmonary's review.   3. CAD: Remote RCA stent.  Last cath in 2005 with 50% in-stent restenosis in the RCA.  4. HTN 5. Type II diabetes. 6. CKD stage IV 7. Chronic systolic CHF: ?ischemic vs nonischemic.   - Echo (1/18) with EF 15-20%, moderate LVH (done at [facility]).  - RHC (1/18): mean RA 3, PA 78/21 mean 45, mean PCWP 7, CI 3.84, PVR 3.9 WU. 8. Pulmonary hypertension: Suspect group 3, related to OHS/OSA.   SH: Married, lives in [place] with wife, nonsmoker.   Family  History  Problem Relation Age of Onset  . Diabetes Father    ROS: All systems reviewed and negative except as per HPI.   Current Outpatient Prescriptions  Medication Sig Dispense Refill  . B-D ULTRAFINE III SHORT PEN 31G X 8 MM MISC USE 1 PEN NEEDLE DAILY 90 each 2  . BD INSULIN SYRINGE ULTRAFINE 31G X 5/16" 1 ML MISC USE 2 SYRINGES PER DAY 180 each 2  . BD ULTRA-FINE PEN NEEDLES 29G X 12.7MM MISC USE 1 PER DAY 1 each 10  . carvedilol (COREG) 25 MG tablet Take 1 tablet (25 mg total) by mouth 2 (two) times daily with a meal. 60 tablet 0  . clopidogrel (PLAVIX) 75 MG tablet Take 75 mg by mouth daily.      . hydrALAZINE (APRESOLINE) 100 MG tablet Take 1 tablet (100 mg total) by mouth 3 (three) times daily. 270 tablet 3  . insulin glargine (LANTUS) 100 UNIT/ML injection Inject 0.9 mLs (90 Units total) into the skin every morning. 9 vial 2  . insulin lispro (HUMALOG KWIKPEN) 100 UNIT/ML KiwkPen Inject 0.6 mLs (60 Units total) into the skin daily with supper. (Patient taking differently: Inject 50 Units into the skin daily with supper. ) 60 mL 3  . ipratropium-albuterol (DUONEB) 0.5-2.5 (3) MG/3ML SOLN Inhale 3 mLs into the lungs 4 (four) times daily.    . nicotine (NICODERM CQ -  DOSED IN MG/24 HOURS) 21 mg/24hr patch Place 1 patch (21 mg total) onto the skin daily. 28 patch 0  . simvastatin (ZOCOR) 20 MG tablet Take 20 mg by mouth at bedtime.      . torsemide (DEMADEX) 20 MG tablet Take 2 tablets (40 mg total) by mouth daily. 30 tablet 0  . traZODone (DESYREL) 100 MG tablet Take 100 mg by mouth at bedtime. Reported on 07/18/2015    . aspirin EC 81 MG tablet Take 1 tablet (81 mg total) by mouth daily. 90 tablet 3  . isosorbide mononitrate (IMDUR) 120 MG 24 hr tablet Take 1 tablet (120 mg total) by mouth daily.     No current facility-administered medications for this encounter.    BP 130/72   Pulse 82   Wt 298 lb 12 oz (135.5 kg)   SpO2 95% Comment: on 6L  BMI 41.67 kg/m  General: NAD Neck:  Thick, no JVD, no thyromegaly or thyroid nodule.  Lungs: Clear to auscultation bilaterally with normal respiratory effort. CV: Nondisplaced PMI.  Heart regular S1/S2, no S3/S4, no murmur. 1+ edema to knees bilaterally.  No carotid bruit.  Normal pedal pulses.  Abdomen: Soft, nontender, no hepatosplenomegaly, no distention.  Skin: Intact without lesions or rashes.  Neurologic: Alert and oriented x 3.  Psych: Normal affect. Extremities: No clubbing or cyanosis.  HEENT: Normal.   Assessment/Plan: 1. Chronic systolic CHF: Echo 7/34 with EF 15-20%.  ?Nonischemic versus ischemic.  He has history of remote CAD but no chest pain prior to this admission, no ACS. Coronary angiography not done due to elevated creatinine.  On exam today, he does not appear significantly volume overloaded. NYHA class II-III symptoms.  - Change hydralazine to 100 mg tid and continue Imdur 120 mg daily.  - Continue Coreg 25 mg bid.  - Continue torsemide 40 mg daily. Check BMET/BNP today.  2. OHS/OSA: On home oxygen, 6 L by nasal cannula.  He needs sleep study, has been set up already.  He has pulmonary followup.  3. Chronic hypoxemic respiratory failure: Suspect primarily due to OHS/OSA.  CT chest was reviewed by pulmonary, no ILD and sarcoidosis thought to be less likely. Has followup with pulmonary to continue to evaluate.  4. Pulmonary hypertension: Severe on RHC in 1/18. Suspect group 3 due primarily to OHS/OSA.  Pulmonary thinks sarcoidosis is less likely at this point.  I do not think that he would be likely to benefit from selective pulmonary vasodilators.   5. CKD: Stage IV.  Suspect related to HTN and diabetes.  I will make a nephrology referral for him.  6. CAD: No ACS last admission, no chest pain.   - Continue simvastatin.  Check lipids today.  - Can decrease ASA to 81 daily.   Loralie Champagne 07/18/2016

## 2016-07-20 ENCOUNTER — Telehealth (HOSPITAL_COMMUNITY): Payer: Self-pay | Admitting: Cardiology

## 2016-07-20 ENCOUNTER — Telehealth (HOSPITAL_COMMUNITY): Payer: Self-pay | Admitting: *Deleted

## 2016-07-20 ENCOUNTER — Ambulatory Visit (INDEPENDENT_AMBULATORY_CARE_PROVIDER_SITE_OTHER): Payer: Managed Care, Other (non HMO) | Admitting: Acute Care

## 2016-07-20 ENCOUNTER — Encounter: Payer: Self-pay | Admitting: Acute Care

## 2016-07-20 ENCOUNTER — Ambulatory Visit (INDEPENDENT_AMBULATORY_CARE_PROVIDER_SITE_OTHER)
Admission: RE | Admit: 2016-07-20 | Discharge: 2016-07-20 | Disposition: A | Discharge status: Home / Self Care | Payer: Managed Care, Other (non HMO) | Source: Ambulatory Visit | Attending: Acute Care | Admitting: Acute Care

## 2016-07-20 VITALS — BP 154/70 | HR 62 | Ht 71.0 in | Wt 300.4 lb

## 2016-07-20 DIAGNOSIS — I509 Heart failure, unspecified: Secondary | ICD-10-CM | POA: Diagnosis not present

## 2016-07-20 DIAGNOSIS — I2721 Secondary pulmonary arterial hypertension: Secondary | ICD-10-CM

## 2016-07-20 DIAGNOSIS — F17200 Nicotine dependence, unspecified, uncomplicated: Secondary | ICD-10-CM

## 2016-07-20 DIAGNOSIS — I5041 Acute combined systolic (congestive) and diastolic (congestive) heart failure: Secondary | ICD-10-CM

## 2016-07-20 DIAGNOSIS — J810 Acute pulmonary edema: Secondary | ICD-10-CM | POA: Diagnosis not present

## 2016-07-20 DIAGNOSIS — J9621 Acute and chronic respiratory failure with hypoxia: Secondary | ICD-10-CM | POA: Diagnosis not present

## 2016-07-20 DIAGNOSIS — E662 Morbid (severe) obesity with alveolar hypoventilation: Secondary | ICD-10-CM

## 2016-07-20 DIAGNOSIS — J9622 Acute and chronic respiratory failure with hypercapnia: Secondary | ICD-10-CM

## 2016-07-20 DIAGNOSIS — E7800 Pure hypercholesterolemia, unspecified: Secondary | ICD-10-CM

## 2016-07-20 MED ORDER — ATORVASTATIN CALCIUM 20 MG PO TABS: 20.0000 mg | ORAL_TABLET | Freq: Every day | ORAL | 1 refills | Status: DC

## 2016-07-20 MED ORDER — ATORVASTATIN CALCIUM 20 MG PO TABS: 20.0000 mg | ORAL_TABLET | Freq: Every day | ORAL | 3 refills | Status: AC

## 2016-07-20 NOTE — Assessment & Plan Note (Signed)
Has quit smoking since hospitalization Plan: I have congratulated Gabriel Alvarez on his accomplishment!1 Encouraged to remain smoke free Counseled on smoking abstinence. Continue to support success as needed.

## 2016-07-20 NOTE — Telephone Encounter (Signed)
Decrease hydralazine to 75 mg tid.

## 2016-07-20 NOTE — Assessment & Plan Note (Signed)
severe hypoxemia due to basal atelectasis related shunting and OSA/OHV and pulm htn Plan: Diagnostic split night sleep study ordered. Follow up with Dr. Elsworth Soho 08/26/2016 Please contact office for sooner follow up if symptoms do not improve or worsen or seek emergency care

## 2016-07-20 NOTE — Telephone Encounter (Signed)
Patient called to report not feeling well after starting increased dose of bidil  SE include: feeling cold, increased weakness and not feeling  Please advise

## 2016-07-20 NOTE — Assessment & Plan Note (Signed)
Per [facility] 06/2016 PAP=45 mm Hg. Most likely 2/2 OSA/ OHV Plan: Diagnostic split night sleep study Follow up with Dr. Elsworth Soho 08/26/2016 for evaluation of CPAP/ BIPAP treatment.

## 2016-07-20 NOTE — Patient Instructions (Addendum)
It is nice to meet you today. CXR today. We will call you with the results. We will schedule an in lab slit night diagnostic sleep study. We will place an order for a smaller POC. I am so proud of you for quitting smoking. Continue your DuoNebs four times daily Remember  Your oxygen saturations should be 88-92%. Wear your oxygen at 4 L as you have been doing. Follow up with Dr. Elsworth Soho March 7 th as is already scheduled for sleep consult. Please contact office for sooner follow up if symptoms do not improve or worsen or seek emergency care

## 2016-07-20 NOTE — Assessment & Plan Note (Signed)
Resolving since diuresis of 32 liters Plan: CXR today. We will call you with the results. Oxygen saturations goals are 88-92% Wear your oxygen at 4 L as ordered to maintain saturations within goal range. We will place an order for a smaller POC. I am so proud of you for quitting smoking. Continue your DuoNebs four times daily Follow up with Dr. Elsworth Soho March 7 th as is already scheduled for sleep consult. Please contact office for sooner follow up if symptoms do not improve or worsen or seek emergency care

## 2016-07-20 NOTE — Assessment & Plan Note (Signed)
Daily weights Diuretic  as scheduled Follow up with cards for weight gain. Please contact office for sooner follow up if symptoms do not improve or worsen or seek emergency care

## 2016-07-20 NOTE — Telephone Encounter (Signed)
Notes Recorded by Kennieth Rad, RN on 07/20/2016 at 2:10 PM EST Spoke with patient's wife and she is agreeable with plan. Orders placed for medications and labs in epic. Added patient to lab schedule for March 29th. ------  Notes Recorded by Larey Dresser, MD on 07/18/2016 at 10:39 PM EST Creatinine better, LDL too high (goal LDL< 70). Stop simvastatin, start atorvastatin 20 mg daily with lipids/LFTs in 2 months.

## 2016-07-20 NOTE — Progress Notes (Signed)
History of Present Illness Gabriel Alvarez is a 66 y.o. male with severe hypoxemia due to basal atelectasis related shunting and OSA/OHV and pulm htn with OSA/OHV major part. Possible upper airway nasal obstruction contributing to hypoxemia. He was seen as an inpatient 07/02/2016 by Dr. Chase Caller, and is here for hospital follow up.  Other medical history significant for CKD, CAD, CHF with EF 15%  Brief narrative/history of present illness   66 year old morbidly obese male with history of COPD, ongoing tobacco use, type 2 diabetes mellitus, CAD status post? Stenting per patient presented to the ED from outside  hospital with shortness of breath on 06/21/2016. 2-D echo done there showed EF of 15% and he was transferred to [facility] for cardiology evaluation. CT of the chest showed groundglass opacity with concern for sarcoid versus edema. Per HRCT done 06/30/2016 there was no indication of ILD or sarcoid. Patient underwent right heart catheterization on 1/5 showing pulmonary hypertension and placed on diuresis with improvement. Pulmonary following him as an inpatient.  Hospital Admission Synopsis:  06/22/16 - Echo ([facility]) >>>Echocardiogram was done on 06/22/2016 and showed moderate concentric left ventricular hypertrophy with an LVEF of 15-20%. Also with grade 2 diastolic dysfunction. He had normal biatrial size.Last echo in 2013 with normal LV function.  CT chest ([facility])>>> persistent mediastinal and hilar adenopathy unchanged from 2013. Interstitial prominence and patchy groundglass opacities overall findings favoring sarcoidosis could possibly be related to pulmonary edema and small right pleural effusion noted.  06/23/2016 - admit [facility] 1.5/18 RHC 1/5 >>> mildly elevated right and left heart filling pressures, severe PAH, PA mean 50, PCWP 18 06/30/16 - RHC 1/9>>> PA mean 45, PCWP mean 7 06/30/16 HRCT [facility] -NO ILD. NO SARCOID. Mediastinal node is minima. There is Co Art calcification, pulm thn, pulm  edema and bilateral LL atelectasis   07/20/2016 Hospital Follow up:  Pt presents today for hospital follow up. He was admitted 06/23/2016-07/03/2016 with severe heart failure ( EF 38%, grade 2 diastolic dysfunction), and acute on chronic hypoxic, hypercarbic respiratory failure .There was concern for sarcoid based on CT scan from [facility], but per [facility] done 06/30/2016 there was no ILD or scarcoidsis. Right heart cath indicated PAP of 45 mm Hg. He was treated in the hospital with BiPAP and high flow nasal cannula. He was diuresed 32 liters during his hospitalization. He was discharged home on 4 L [place]. He did wear nocturnal oxygen prior to admission, and now if wearing it 24/7. He was discharged home with home health follow up with  oxygen for use day and night. He uses 4 L [place] at home. Saturations were 83 % today in the office but he was not wearing his oxygen. He was worrying about running out of oxygen for the trip home. We placed him on one of the office tanks, and educated him on saturation goals of 88-92%. He is compliant with his torsemide and weighs himself daily. His weight has been stable.He is using his DuoNebs four times daily. He denies fever, chest pain, orthopnea or hemoptysis.He has only had to use his rescue inhaler once since discharge.He is wearing his TED hose. His edema is minimal today. He has totally quit smoking!! We congratulated him.He does have daytime sleepiness. He states he does not sleep for long periods of time at night.He had pulmonary hypertension on RHC ( PA Mean=36m Hg) on 06/30/2016, so he is scheduled for Sleep Evaluation for OSA with Dr. AElsworth Soho3/12/2016.I will order the split night diagnostic sleep  study today to start the process.He states he is doing much better since discharge. He can breath much better. His wife is very organized and has his appointments with his PCP and cardiology scheduled. He is very aware of daily weights and calling cards for increases in his weight. He denies  chest pain, fever, orthopnea or hemoptysis.  I spent 5 minutes counseling on smoking abstinence as patient has recently quit smoking.  Tests CXR 07/20/2016 IMPRESSION: Stable cardiomegaly. Improved aeration of the lung bases. Persistent mild interstitial prominence may represent mild interstitial pulmonary edema.  Past medical hx Past Medical History:  Diagnosis Date  . CEREBROVASCULAR ACCIDENT, ACUTE 09/05/2008  . CORONARY ARTERY DISEASE 12/30/2006  . DIABETES MELLITUS, TYPE II 12/30/2006  . DM neuropathy, type II diabetes mellitus ([place])   . ED (erectile dysfunction)   . GOUT 07/30/2008  . HYPERCHOLESTEROLEMIA 07/30/2008  . HYPERTENSION 12/30/2006     Past surgical hx, Family hx, Social hx all reviewed.  Current Outpatient Prescriptions on File Prior to Visit  Medication Sig  . aspirin EC 81 MG tablet Take 1 tablet (81 mg total) by mouth daily.  . B-D ULTRAFINE III SHORT PEN 31G X 8 MM MISC USE 1 PEN NEEDLE DAILY  . BD INSULIN SYRINGE ULTRAFINE 31G X 5/16" 1 ML MISC USE 2 SYRINGES PER DAY  . BD ULTRA-FINE PEN NEEDLES 29G X 12.7MM MISC USE 1 PER DAY  . carvedilol (COREG) 25 MG tablet Take 1 tablet (25 mg total) by mouth 2 (two) times daily with a meal.  . clopidogrel (PLAVIX) 75 MG tablet Take 75 mg by mouth daily.    . hydrALAZINE (APRESOLINE) 100 MG tablet Take 1 tablet (100 mg total) by mouth 3 (three) times daily.  . insulin glargine (LANTUS) 100 UNIT/ML injection Inject 0.9 mLs (90 Units total) into the skin every morning.  . insulin lispro (HUMALOG KWIKPEN) 100 UNIT/ML KiwkPen Inject 0.6 mLs (60 Units total) into the skin daily with supper. (Patient taking differently: Inject 50 Units into the skin daily with supper. )  . ipratropium-albuterol (DUONEB) 0.5-2.5 (3) MG/3ML SOLN Inhale 3 mLs into the lungs 4 (four) times daily.  . isosorbide mononitrate (IMDUR) 120 MG 24 hr tablet Take 1 tablet (120 mg total) by mouth daily.  . nicotine (NICODERM CQ - DOSED IN MG/24 HOURS) 21 mg/24hr  patch Place 1 patch (21 mg total) onto the skin daily.  Marland Kitchen torsemide (DEMADEX) 20 MG tablet Take 2 tablets (40 mg total) by mouth daily.  . traZODone (DESYREL) 100 MG tablet Take 100 mg by mouth at bedtime. Reported on 07/18/2015   No current facility-administered medications on file prior to visit.      No Known Allergies  Review Of Systems:  Constitutional:   No  weight loss, night sweats,  Fevers, chills, +fatigue, or  Lassitude.+ daytime sleepiness  HEENT:   No headaches,  Difficulty swallowing,  Tooth/dental problems, or  Sore throat,                No sneezing, itching, ear ache, nasal congestion, post nasal drip,   CV:  No chest pain,  Orthopnea, PND, swelling in lower extremities, anasarca, dizziness, palpitations, syncope.   GI  No heartburn, indigestion, abdominal pain, nausea, vomiting, diarrhea, change in bowel habits,+ loss of appetite,no  bloody stools.   Resp: + shortness of breath with exertion not  at rest.  No excess mucus, no productive cough,  No non-productive cough,  No coughing up of blood.  No  change in color of mucus.  No wheezing.  No chest wall deformity  Skin: no rash or lesions.  GU: no dysuria, change in color of urine, no urgency or frequency.  No flank pain, no hematuria   MS:  No joint pain or swelling.  + decreased range of motion.  No back pain.  Psych:  No change in mood or affect. No depression or anxiety.  No memory loss.   Vital Signs BP (!) 154/70 (BP Location: Left Arm, Cuff Size: Normal)   Pulse 62   Ht _0  (1.803 m)   Wt (!) 300 lb 6.4 oz (136.3 kg)   SpO2 (!) 83%   BMI 41.90 kg/m    Physical Exam:  General- No distress,  A&Ox3, obese, pleasant ENT: No sinus tenderness, TM clear, pale nasal mucosa, no oral exudate,no post nasal drip, no LAN Cardiac: S1, S2, regular rate and rhythm, no murmur Chest: No wheeze/ rales/ dullness; no accessory muscle use, no nasal flaring, no sternal retractions, diminished per bases  bilaterally Abd.: Soft Non-tender, obese Ext: No clubbing cyanosis, edema Neuro:  Deconditioned at baseline Skin: No rashes, warm and dry Psych: normal mood and behavior   Assessment/Plan  Acute on chronic respiratory failure (HCC) Resolving since diuresis of 32 liters Plan: CXR today. We will call you with the results. Oxygen saturations goals are 88-92% Wear your oxygen at 4 L as ordered to maintain saturations within goal range. We will place an order for a smaller POC. I am so proud of you for quitting smoking. Continue your DuoNebs four times daily Follow up with Dr. Elsworth Soho March 7 th as is already scheduled for sleep consult. Please contact office for sooner follow up if symptoms do not improve or worsen or seek emergency care     PAH (pulmonary artery hypertension) Per RHC 06/2016 PAP=45 mm Hg. Most likely 2/2 OSA/ OHV Plan: Diagnostic split night sleep study Follow up with Dr. Elsworth Soho 08/26/2016 for evaluation of CPAP/ BIPAP treatment.  Pulmonary edema Daily weights Diuretic  as scheduled Follow up with cards for weight gain. Please contact office for sooner follow up if symptoms do not improve or worsen or seek emergency care   SMOKER Has quit smoking since hospitalization Plan: I have congratulated Mr. Wenger on his accomplishment!1 Encouraged to remain smoke free Counseled on smoking abstinence. Continue to support success as needed.   Obesity hypoventilation syndrome (HCC) severe hypoxemia due to basal atelectasis related shunting and OSA/OHV and pulm htn Plan: Diagnostic split night sleep study ordered. Follow up with Dr. Elsworth Soho 08/26/2016 Please contact office for sooner follow up if symptoms do not improve or worsen or seek emergency care     Magdalen Spatz, NP 07/20/2016  5:28 PM

## 2016-07-22 ENCOUNTER — Telehealth: Payer: Self-pay | Admitting: Acute Care

## 2016-07-22 MED ORDER — HYDRALAZINE HCL 25 MG PO TABS: 75.0000 mg | ORAL_TABLET | Freq: Three times a day (TID) | ORAL | 3 refills | Status: AC

## 2016-07-22 NOTE — Telephone Encounter (Signed)
Pt aware and voiced understanding 

## 2016-07-22 NOTE — Telephone Encounter (Signed)
I called [employer] & spoke to [place].  She states they received the order but pt already has portable concentrator so when they got the order it was just scanned to his account.  She is going to call pt & talk to him.  Nothing further needed at this time.

## 2016-07-22 NOTE — Telephone Encounter (Signed)
Pt  Wife call back stating that the need the small tank that you carry.Hillery Hunter'

## 2016-07-24 ENCOUNTER — Other Ambulatory Visit (HOSPITAL_COMMUNITY): Payer: Self-pay | Admitting: *Deleted

## 2016-07-24 ENCOUNTER — Telehealth: Payer: Self-pay | Admitting: Acute Care

## 2016-07-24 ENCOUNTER — Telehealth (HOSPITAL_COMMUNITY): Payer: Self-pay | Admitting: Vascular Surgery

## 2016-07-24 ENCOUNTER — Ambulatory Visit (INDEPENDENT_AMBULATORY_CARE_PROVIDER_SITE_OTHER): Payer: Managed Care, Other (non HMO) | Admitting: Acute Care

## 2016-07-24 DIAGNOSIS — J9621 Acute and chronic respiratory failure with hypoxia: Secondary | ICD-10-CM | POA: Diagnosis not present

## 2016-07-24 DIAGNOSIS — J9622 Acute and chronic respiratory failure with hypercapnia: Secondary | ICD-10-CM

## 2016-07-24 MED ORDER — TORSEMIDE 20 MG PO TABS: 40.0000 mg | ORAL_TABLET | Freq: Every day | ORAL | 3 refills | Status: DC

## 2016-07-24 MED ORDER — CARVEDILOL 25 MG PO TABS: 25.0000 mg | ORAL_TABLET | Freq: Two times a day (BID) | ORAL | 3 refills | Status: DC

## 2016-07-24 NOTE — Telephone Encounter (Signed)
Refill Torsemide

## 2016-07-24 NOTE — Telephone Encounter (Signed)
Spoke with Gabriel Alvarez with [facility], who states a Rx and O2 sats are needed. It appears that a qualifying walk was not done at [facility] with [facility]. I have spoke with pt and made him aware that a qualifying walk is needed. Pt was very upset, as he lives in [place]. Pt has been scheduled for walk 07-27-16 @ 9:30  Nothing further needed.

## 2016-07-27 ENCOUNTER — Ambulatory Visit: Payer: Medicare Other

## 2016-07-27 ENCOUNTER — Encounter (HOSPITAL_COMMUNITY): Payer: Self-pay | Admitting: *Deleted

## 2016-07-27 ENCOUNTER — Other Ambulatory Visit (HOSPITAL_COMMUNITY): Payer: Self-pay | Admitting: *Deleted

## 2016-07-27 ENCOUNTER — Telehealth: Payer: Self-pay | Admitting: Internal Medicine

## 2016-07-27 ENCOUNTER — Telehealth: Payer: Self-pay | Admitting: Acute Care

## 2016-07-27 DIAGNOSIS — I2721 Secondary pulmonary arterial hypertension: Secondary | ICD-10-CM

## 2016-07-27 NOTE — Telephone Encounter (Signed)
ATC [employer], they are closed.  Will call back tomorrow 2/6

## 2016-07-27 NOTE — Telephone Encounter (Signed)
Spoke with pt and his wife, Hassan Rowan. Pt was tested on Friday for oxygen. He needs 6L with exertion. An order was sent to [facility] for the oxygen but they can't supply pt with a POC due to his high liter flow. Advised the pt and his wife, that since he is on such a high liter flow, most DME's will not be able to supply him with a POC. Pt became frustrated and advised me to just speak to his wife. Hassan Rowan wants [place] to send an order to [facility] in [place] to see what they can offer. Order has been placed. Nothing further was needed.

## 2016-07-27 NOTE — Progress Notes (Signed)
Pt's wife FMLA forms completed and signed by Dr Aundra Dubin, so she can have time off to take him to appts.  Forms faxed to [employer] administrator at [phone], pt's wife aware, copy left for her at the front desk

## 2016-07-28 NOTE — Telephone Encounter (Signed)
Spoke with pt, states [facility] is unable to provide him with a POC that will accomodate 6lpm- was told he would have to purchase his POC if he chooses to receive this.  I advised pt that unfortunately due to his high 02 flow he may not be able to get a POC and may have to use tanks as several DME companies have now told him.  Pt then hung up the phone.  Will close encounter.

## 2016-07-28 NOTE — Telephone Encounter (Signed)
Spoke with [place] at [facility].  Per phone note on 07/27/16 Pt and his wife requested that we send his oxygen order to another company to see what they can offer,  Order was sent to [facility].  [facility] will discontinue order at this time.  Nothing further needed at this time.

## 2016-07-29 ENCOUNTER — Other Ambulatory Visit (HOSPITAL_COMMUNITY): Payer: Self-pay | Admitting: *Deleted

## 2016-07-29 MED ORDER — CARVEDILOL 25 MG PO TABS: 25.0000 mg | ORAL_TABLET | Freq: Two times a day (BID) | ORAL | 3 refills | Status: AC

## 2016-07-29 MED ORDER — TORSEMIDE 20 MG PO TABS: 40.0000 mg | ORAL_TABLET | Freq: Every day | ORAL | 3 refills | Status: AC

## 2016-07-30 ENCOUNTER — Other Ambulatory Visit (HOSPITAL_COMMUNITY): Payer: Self-pay | Admitting: *Deleted

## 2016-07-31 ENCOUNTER — Encounter (HOSPITAL_COMMUNITY): Payer: Self-pay

## 2016-07-31 ENCOUNTER — Ambulatory Visit (HOSPITAL_COMMUNITY)
Admission: RE | Admit: 2016-07-31 | Discharge: 2016-07-31 | Disposition: A | Discharge status: Home / Self Care | Payer: Managed Care, Other (non HMO) | Source: Ambulatory Visit | Attending: Cardiology | Admitting: Cardiology

## 2016-07-31 VITALS — BP 136/72 | HR 68 | Wt 290.5 lb

## 2016-07-31 DIAGNOSIS — I10 Essential (primary) hypertension: Secondary | ICD-10-CM | POA: Diagnosis not present

## 2016-07-31 DIAGNOSIS — I2721 Secondary pulmonary arterial hypertension: Secondary | ICD-10-CM

## 2016-07-31 DIAGNOSIS — Z9981 Dependence on supplemental oxygen: Secondary | ICD-10-CM | POA: Insufficient documentation

## 2016-07-31 DIAGNOSIS — I130 Hypertensive heart and chronic kidney disease with heart failure and stage 1 through stage 4 chronic kidney disease, or unspecified chronic kidney disease: Secondary | ICD-10-CM | POA: Insufficient documentation

## 2016-07-31 DIAGNOSIS — J9611 Chronic respiratory failure with hypoxia: Secondary | ICD-10-CM | POA: Diagnosis not present

## 2016-07-31 DIAGNOSIS — Z6841 Body Mass Index (BMI) 40.0 and over, adult: Secondary | ICD-10-CM | POA: Insufficient documentation

## 2016-07-31 DIAGNOSIS — I5022 Chronic systolic (congestive) heart failure: Secondary | ICD-10-CM | POA: Insufficient documentation

## 2016-07-31 DIAGNOSIS — E1122 Type 2 diabetes mellitus with diabetic chronic kidney disease: Secondary | ICD-10-CM | POA: Diagnosis not present

## 2016-07-31 DIAGNOSIS — Z794 Long term (current) use of insulin: Secondary | ICD-10-CM | POA: Diagnosis not present

## 2016-07-31 DIAGNOSIS — N184 Chronic kidney disease, stage 4 (severe): Secondary | ICD-10-CM | POA: Insufficient documentation

## 2016-07-31 DIAGNOSIS — Z79899 Other long term (current) drug therapy: Secondary | ICD-10-CM | POA: Insufficient documentation

## 2016-07-31 DIAGNOSIS — I2510 Atherosclerotic heart disease of native coronary artery without angina pectoris: Secondary | ICD-10-CM

## 2016-07-31 DIAGNOSIS — Z7982 Long term (current) use of aspirin: Secondary | ICD-10-CM | POA: Diagnosis not present

## 2016-07-31 DIAGNOSIS — G4733 Obstructive sleep apnea (adult) (pediatric): Secondary | ICD-10-CM | POA: Diagnosis not present

## 2016-07-31 DIAGNOSIS — I272 Pulmonary hypertension, unspecified: Secondary | ICD-10-CM | POA: Insufficient documentation

## 2016-07-31 LAB — BASIC METABOLIC PANEL
ANION GAP: 11 (ref 5–15)
BUN: 27 mg/dL — ABNORMAL HIGH (ref 6–20)
CO2: 25 mmol/L (ref 22–32)
Calcium: 8.5 mg/dL — ABNORMAL LOW (ref 8.9–10.3)
Chloride: 105 mmol/L (ref 101–111)
Creatinine, Ser: 2.95 mg/dL — ABNORMAL HIGH (ref 0.61–1.24)
GFR, EST AFRICAN AMERICAN: 24 mL/min — AB (ref >60)
GFR, EST NON AFRICAN AMERICAN: 21 mL/min — AB (ref >60)
Glucose, Bld: 154 mg/dL — ABNORMAL HIGH (ref 65–99)
POTASSIUM: 4.4 mmol/L (ref 3.5–5.1)
SODIUM: 141 mmol/L (ref 135–145)

## 2016-07-31 NOTE — Patient Instructions (Addendum)
Routine lab work today. Will notify you of abnormal results, otherwise no news is good news!  Will refer you to [facility] for Nephrology follow up. They will call you to schedule your initial appointment, Address: [address] [place], [place] [address]  Phone: [phone]  Follow up 6 weeks with Gabriel Kilts PA-C.  Do the following things EVERYDAY: 1) Weigh yourself in the morning before breakfast. Write it down and keep it in a log. 2) Take your medicines as prescribed 3) Eat low salt foods-Limit salt (sodium) to 2000 mg per day.  4) Stay as active as you can everyday 5) Limit all fluids for the day to less than 2 liters

## 2016-07-31 NOTE — Progress Notes (Signed)
PCP: Dr. Quintin Alto Cardiology: Dr. Aundra Dubin  66 yo with history of CAD, chronic systolic CHF, CKD stage IV, and OHS/OSA with chronic hypoxemic respiratory failure presents for hospital followup.  Patient had remote stent to RCA.  Recently, he was admitted to [facility] with acute on chronic hypoxemic respiratory failure and acute systolic CHF.  Echo was done showing EF 15-20%.  He was transferred to [facility].  He initially required Bipap.  He was diuresed and weaned to high flow oxygen by nasal cannula.  There was initial suspicion for sarcoidosis, but on review of CT chest the pulmonologist thought this was less likely.  Lurline Idol was recommended, but he ended up weaning to low enough oxygen that he was able to go home.  RHC prior to discharge showed normal filling pressures but severe pulmonary hypertension.  This is thought to be due to OHS/OSA. Creatinine was high, > 3 throughout hospitalization.    He presents today for regular follow up.  He is down 8 lbs since last visit. Breathing has been doing fine.  Using oxygen intermittently. Gets around the house fine. No SOB with bathing or changing clothes. Hydralazine decreased with complaints of cold and weakness.  No chest pain, orthopnea. Denies lightheadedness or dizziness. Has not had sleep study. Started PT at home, so has been walking with them. Hasnt needed any extra fluid pill.   Labs (1/18): K 4.4, creatinine 3.18  PMH: 1. OHS/OSA: Strong suspicion.  Has not yet had sleep study.  2. ?Sarcoidosis: Chronic mediastinal adenopathy on HRCT in 1/18 but no evidence of parenchymal lung sarcoidosis or ILD per pulmonary's review.   3. CAD: Remote RCA stent.  Last cath in 2005 with 50% in-stent restenosis in the RCA.  4. HTN 5. Type II diabetes. 6. CKD stage IV 7. Chronic systolic CHF: ?ischemic vs nonischemic.   - Echo (1/18) with EF 15-20%, moderate LVH (done at [facility]).  - RHC (1/18): mean RA 3, PA 78/21 mean 45, mean PCWP 7, CI 3.84, PVR 3.9 WU. 8. Pulmonary  hypertension: Suspect group 3, related to OHS/OSA.   SH: Married, lives in [place] with wife, nonsmoker.   Family History  Problem Relation Age of Onset  . Diabetes Father    ROS: All systems reviewed and negative except as per HPI.   Current Outpatient Prescriptions  Medication Sig Dispense Refill  . aspirin EC 81 MG tablet Take 1 tablet (81 mg total) by mouth daily. 90 tablet 3  . atorvastatin (LIPITOR) 20 MG tablet Take 1 tablet (20 mg total) by mouth daily. 90 tablet 3  . B-D ULTRAFINE III SHORT PEN 31G X 8 MM MISC USE 1 PEN NEEDLE DAILY 90 each 2  . BD INSULIN SYRINGE ULTRAFINE 31G X 5/16" 1 ML MISC USE 2 SYRINGES PER DAY 180 each 2  . BD ULTRA-FINE PEN NEEDLES 29G X 12.7MM MISC USE 1 PER DAY 1 each 10  . carvedilol (COREG) 25 MG tablet Take 1 tablet (25 mg total) by mouth 2 (two) times daily with a meal. 180 tablet 3  . clopidogrel (PLAVIX) 75 MG tablet Take 75 mg by mouth daily.      . hydrALAZINE (APRESOLINE) 25 MG tablet Take 3 tablets (75 mg total) by mouth 3 (three) times daily. 270 tablet 3  . insulin glargine (LANTUS) 100 UNIT/ML injection Inject 0.9 mLs (90 Units total) into the skin every morning. 9 vial 2  . insulin lispro (HUMALOG KWIKPEN) 100 UNIT/ML KiwkPen Inject 0.6 mLs (60 Units total) into the  skin daily with supper. (Patient taking differently: Inject 50 Units into the skin daily with supper. ) 60 mL 3  . ipratropium-albuterol (DUONEB) 0.5-2.5 (3) MG/3ML SOLN Inhale 3 mLs into the lungs 4 (four) times daily.    . isosorbide mononitrate (IMDUR) 120 MG 24 hr tablet Take 1 tablet (120 mg total) by mouth daily.    . nicotine (NICODERM CQ - DOSED IN MG/24 HOURS) 21 mg/24hr patch Place 1 patch (21 mg total) onto the skin daily. 28 patch 0  . torsemide (DEMADEX) 20 MG tablet Take 2 tablets (40 mg total) by mouth daily. 180 tablet 3  . traZODone (DESYREL) 100 MG tablet Take 100 mg by mouth at bedtime. Reported on 07/18/2015     No current facility-administered medications for  this encounter.    BP 136/72   Pulse 68   Wt 290 lb 8 oz (131.8 kg)   SpO2 90%   BMI 40.52 kg/m    Wt Readings from Last 3 Encounters:  07/31/16 290 lb 8 oz (131.8 kg)  07/20/16 (!) 300 lb 6.4 oz (136.3 kg)  07/17/16 298 lb 12 oz (135.5 kg)     General: Fatigued appearing, NAD.  Neck: Thick, JVP difficult. no thyromegaly or thyroid nodule.  Lungs: Diminished basilar sounds, Normal effort.  CV: Nondisplaced PMI.  Heart regular S1/S2, no S3/S4, no murmur. Chronic 1+ woody edema. No carotid bruit.  Normal pedal pulses.  Abdomen: Obese, soft, NT, ND, no HSM. No bruits or masses. +BS  Skin: Intact without lesions or rashes.  Neurologic: Alert and oriented x 3.  Psych: Normal affect. Extremities: No clubbing or cyanosis.  HEENT: Normal.   Assessment/Plan: 1. Chronic systolic CHF: Echo 6/29 with EF 15-20%.  ?Nonischemic versus ischemic.  He has history of remote CAD but no chest pain prior to this admission, no ACS. Coronary angiography not done due to elevated creatinine.   - Volume status appears stable and weight down 8 lbs. NYHA class II-III symptoms.  - Continue hydralazine 75 mg TID. Has not tolerated up-titration.  - Continue Imdur 120 mg daily.  - Continue Coreg 25 mg bid.  - Continue torsemide 40 mg daily. Check BMET today.  2. OHS/OSA: On home oxygen, 6 L by nasal cannula. - Has been set up for sleep follow up and sleep study next month. Encouraged him to follow through with this.    3. Chronic hypoxemic respiratory failure: Suspect primarily due to OHS/OSA.  CT chest was reviewed by pulmonary, no ILD and sarcoidosis thought to be less likely.  - Continue to follow with pulmonary.   4. Pulmonary hypertension: Severe on RHC in 1/18. Suspect group 3 due primarily to OHS/OSA.  Pulmonary thinks sarcoidosis is less likely at this point.  - Do not think he would likely benefit from selective pulmonary vasodilators.   5. CKD: Stage IV.  Suspect related to HTN and diabetes. - Will  check on Renal referral as he has not yet heard back.   6. CAD:  - Stable. No CP.    - Continue atorvastatin 20 mg daily.   - Continue ASA to 81 daily.  7. HTN - Meds as above.  8. Morbid Obesity - Encouraged to watch portions and increase activity as able.  - Continue home PT.   Stable. Has been intolerant to up-titration of hydralazine with fatigue and "feeling cold". Continue current meds. Re-send nephrology referral. RTC 6 weeks.   He has asked if he can be seen closer to [facility].  Will discuss with Dr. Aundra Dubin transferring his care to separate office under general cardiology.   Shirley Friar, PA-C  07/31/2016

## 2016-07-31 NOTE — Progress Notes (Signed)
[facility] Referral faxed per Oda Kilts PA-C VO. Copy of referral filed in pending referral folder in CHF clinic.  Renee Pain, RN

## 2016-08-03 NOTE — Progress Notes (Signed)
Spoke with patient and informed him of results. Patient did not have any questions. Nothing further is needed.

## 2016-08-03 NOTE — Progress Notes (Signed)
Left message for patient to call back for medical results.

## 2016-08-05 ENCOUNTER — Telehealth: Payer: Self-pay | Admitting: Endocrinology

## 2016-08-05 NOTE — Telephone Encounter (Signed)
Patient stated he is having diabetic nerve pain in his feet really bad. And would like to see you as soon as possible.

## 2016-08-05 NOTE — Telephone Encounter (Signed)
Patient scheduled for 08/07/2016.

## 2016-08-07 ENCOUNTER — Ambulatory Visit: Payer: Medicare Other | Admitting: Endocrinology

## 2016-08-10 ENCOUNTER — Telehealth (HOSPITAL_COMMUNITY): Payer: Self-pay | Admitting: Vascular Surgery

## 2016-08-10 NOTE — Telephone Encounter (Signed)
[facility] called pt is schedule 08/21/16

## 2016-08-14 ENCOUNTER — Telehealth (HOSPITAL_COMMUNITY): Payer: Self-pay

## 2016-08-14 NOTE — Telephone Encounter (Signed)
Patient called to report 6 lb weight gain this past week with arm/hand swelling. Denies SOB, cough, and otherwise feels well. Advised per Dr. Martyn Malay to take extra 40 mg torsemide once this afternoon. Advised also to call CHF clinic Monday to update and if s/s worsen over weekend to go to urgent care/ED. Aware and agreeable to plan as stated above.  Renee Pain, RN

## 2016-08-20 ENCOUNTER — Emergency Department (HOSPITAL_COMMUNITY): Payer: Managed Care, Other (non HMO)

## 2016-08-20 ENCOUNTER — Inpatient Hospital Stay (HOSPITAL_COMMUNITY)
Admission: EM | Admit: 2016-08-20 | Discharge: 2016-08-22 | DRG: 291 | Disposition: A | Discharge status: Home Health | Payer: Managed Care, Other (non HMO) | Attending: Internal Medicine | Admitting: Internal Medicine

## 2016-08-20 ENCOUNTER — Encounter (HOSPITAL_COMMUNITY): Payer: Self-pay

## 2016-08-20 DIAGNOSIS — I130 Hypertensive heart and chronic kidney disease with heart failure and stage 1 through stage 4 chronic kidney disease, or unspecified chronic kidney disease: Secondary | ICD-10-CM | POA: Diagnosis not present

## 2016-08-20 DIAGNOSIS — E876 Hypokalemia: Secondary | ICD-10-CM | POA: Diagnosis present

## 2016-08-20 DIAGNOSIS — I10 Essential (primary) hypertension: Secondary | ICD-10-CM | POA: Diagnosis not present

## 2016-08-20 DIAGNOSIS — I429 Cardiomyopathy, unspecified: Secondary | ICD-10-CM | POA: Diagnosis present

## 2016-08-20 DIAGNOSIS — D72829 Elevated white blood cell count, unspecified: Secondary | ICD-10-CM

## 2016-08-20 DIAGNOSIS — I5043 Acute on chronic combined systolic (congestive) and diastolic (congestive) heart failure: Secondary | ICD-10-CM | POA: Diagnosis not present

## 2016-08-20 DIAGNOSIS — Z87891 Personal history of nicotine dependence: Secondary | ICD-10-CM

## 2016-08-20 DIAGNOSIS — N184 Chronic kidney disease, stage 4 (severe): Secondary | ICD-10-CM | POA: Diagnosis not present

## 2016-08-20 DIAGNOSIS — E785 Hyperlipidemia, unspecified: Secondary | ICD-10-CM | POA: Diagnosis present

## 2016-08-20 DIAGNOSIS — I272 Pulmonary hypertension, unspecified: Secondary | ICD-10-CM | POA: Diagnosis not present

## 2016-08-20 DIAGNOSIS — J9621 Acute and chronic respiratory failure with hypoxia: Secondary | ICD-10-CM | POA: Diagnosis not present

## 2016-08-20 DIAGNOSIS — M79605 Pain in left leg: Secondary | ICD-10-CM | POA: Diagnosis not present

## 2016-08-20 DIAGNOSIS — M5136 Other intervertebral disc degeneration, lumbar region: Secondary | ICD-10-CM | POA: Diagnosis not present

## 2016-08-20 DIAGNOSIS — E7800 Pure hypercholesterolemia, unspecified: Secondary | ICD-10-CM | POA: Diagnosis present

## 2016-08-20 DIAGNOSIS — E162 Hypoglycemia, unspecified: Secondary | ICD-10-CM

## 2016-08-20 DIAGNOSIS — M545 Low back pain: Secondary | ICD-10-CM | POA: Diagnosis not present

## 2016-08-20 DIAGNOSIS — R748 Abnormal levels of other serum enzymes: Secondary | ICD-10-CM | POA: Diagnosis present

## 2016-08-20 DIAGNOSIS — R509 Fever, unspecified: Secondary | ICD-10-CM | POA: Diagnosis not present

## 2016-08-20 DIAGNOSIS — R0902 Hypoxemia: Secondary | ICD-10-CM | POA: Diagnosis not present

## 2016-08-20 DIAGNOSIS — K5900 Constipation, unspecified: Secondary | ICD-10-CM | POA: Diagnosis present

## 2016-08-20 DIAGNOSIS — M1612 Unilateral primary osteoarthritis, left hip: Secondary | ICD-10-CM | POA: Diagnosis present

## 2016-08-20 DIAGNOSIS — Z9981 Dependence on supplemental oxygen: Secondary | ICD-10-CM

## 2016-08-20 DIAGNOSIS — E1142 Type 2 diabetes mellitus with diabetic polyneuropathy: Secondary | ICD-10-CM | POA: Diagnosis present

## 2016-08-20 DIAGNOSIS — M1712 Unilateral primary osteoarthritis, left knee: Secondary | ICD-10-CM | POA: Diagnosis present

## 2016-08-20 DIAGNOSIS — Z794 Long term (current) use of insulin: Secondary | ICD-10-CM

## 2016-08-20 DIAGNOSIS — E1122 Type 2 diabetes mellitus with diabetic chronic kidney disease: Secondary | ICD-10-CM | POA: Diagnosis present

## 2016-08-20 DIAGNOSIS — M5116 Intervertebral disc disorders with radiculopathy, lumbar region: Secondary | ICD-10-CM | POA: Diagnosis present

## 2016-08-20 DIAGNOSIS — I5023 Acute on chronic systolic (congestive) heart failure: Secondary | ICD-10-CM | POA: Diagnosis not present

## 2016-08-20 DIAGNOSIS — R609 Edema, unspecified: Secondary | ICD-10-CM | POA: Diagnosis not present

## 2016-08-20 DIAGNOSIS — I2510 Atherosclerotic heart disease of native coronary artery without angina pectoris: Secondary | ICD-10-CM | POA: Diagnosis present

## 2016-08-20 DIAGNOSIS — R52 Pain, unspecified: Secondary | ICD-10-CM

## 2016-08-20 DIAGNOSIS — M109 Gout, unspecified: Secondary | ICD-10-CM | POA: Diagnosis not present

## 2016-08-20 DIAGNOSIS — I2721 Secondary pulmonary arterial hypertension: Secondary | ICD-10-CM | POA: Diagnosis present

## 2016-08-20 DIAGNOSIS — M79606 Pain in leg, unspecified: Secondary | ICD-10-CM

## 2016-08-20 DIAGNOSIS — Z833 Family history of diabetes mellitus: Secondary | ICD-10-CM

## 2016-08-20 DIAGNOSIS — I428 Other cardiomyopathies: Secondary | ICD-10-CM

## 2016-08-20 DIAGNOSIS — G4710 Hypersomnia, unspecified: Secondary | ICD-10-CM | POA: Diagnosis present

## 2016-08-20 DIAGNOSIS — I5022 Chronic systolic (congestive) heart failure: Secondary | ICD-10-CM | POA: Diagnosis present

## 2016-08-20 DIAGNOSIS — E11649 Type 2 diabetes mellitus with hypoglycemia without coma: Secondary | ICD-10-CM | POA: Diagnosis present

## 2016-08-20 DIAGNOSIS — E662 Morbid (severe) obesity with alveolar hypoventilation: Secondary | ICD-10-CM | POA: Diagnosis present

## 2016-08-20 DIAGNOSIS — E1151 Type 2 diabetes mellitus with diabetic peripheral angiopathy without gangrene: Secondary | ICD-10-CM

## 2016-08-20 DIAGNOSIS — R0602 Shortness of breath: Secondary | ICD-10-CM | POA: Diagnosis not present

## 2016-08-20 DIAGNOSIS — I509 Heart failure, unspecified: Secondary | ICD-10-CM

## 2016-08-20 DIAGNOSIS — G4733 Obstructive sleep apnea (adult) (pediatric): Secondary | ICD-10-CM

## 2016-08-20 DIAGNOSIS — M25462 Effusion, left knee: Secondary | ICD-10-CM | POA: Diagnosis present

## 2016-08-20 DIAGNOSIS — Z7982 Long term (current) use of aspirin: Secondary | ICD-10-CM

## 2016-08-20 DIAGNOSIS — Z7902 Long term (current) use of antithrombotics/antiplatelets: Secondary | ICD-10-CM

## 2016-08-20 DIAGNOSIS — Z79899 Other long term (current) drug therapy: Secondary | ICD-10-CM

## 2016-08-20 DIAGNOSIS — Z6841 Body Mass Index (BMI) 40.0 and over, adult: Secondary | ICD-10-CM

## 2016-08-20 DIAGNOSIS — M25552 Pain in left hip: Secondary | ICD-10-CM | POA: Diagnosis not present

## 2016-08-20 DIAGNOSIS — J9620 Acute and chronic respiratory failure, unspecified whether with hypoxia or hypercapnia: Secondary | ICD-10-CM | POA: Diagnosis present

## 2016-08-20 HISTORY — DX: Hyperlipidemia, unspecified: E78.5

## 2016-08-20 HISTORY — DX: Other abnormalities of breathing: R06.89

## 2016-08-20 HISTORY — DX: Cardiomyopathy, unspecified: I42.9

## 2016-08-20 HISTORY — DX: Essential (primary) hypertension: I10

## 2016-08-20 HISTORY — DX: Pulmonary hypertension, unspecified: I27.20

## 2016-08-20 HISTORY — DX: Atherosclerotic heart disease of native coronary artery without angina pectoris: I25.10

## 2016-08-20 HISTORY — DX: Gout, unspecified: M10.9

## 2016-08-20 HISTORY — DX: Type 2 diabetes mellitus without complications: E11.9

## 2016-08-20 HISTORY — DX: Obstructive sleep apnea (adult) (pediatric): G47.33

## 2016-08-20 HISTORY — DX: Chronic kidney disease, stage 4 (severe): N18.4

## 2016-08-20 LAB — CBC
HEMATOCRIT: 32.9 % — AB (ref 39.0–52.0)
HEMOGLOBIN: 10.7 g/dL — AB (ref 13.0–17.0)
MCH: 26.8 pg (ref 26.0–34.0)
MCHC: 32.5 g/dL (ref 30.0–36.0)
MCV: 82.5 fL (ref 78.0–100.0)
Platelets: 191 10*3/uL (ref 150–400)
RBC: 3.99 MIL/uL — ABNORMAL LOW (ref 4.22–5.81)
RDW: 15.5 % (ref 11.5–15.5)
WBC: 10.4 10*3/uL (ref 4.0–10.5)

## 2016-08-20 LAB — BLOOD GAS, ARTERIAL
Acid-Base Excess: 2.9 mmol/L — ABNORMAL HIGH (ref 0.0–2.0)
BICARBONATE: 26.4 mmol/L (ref 20.0–28.0)
DRAWN BY: 22223
O2 Content: 6 L/min
O2 Saturation: 84.1 %
PH ART: 7.408 (ref 7.350–7.450)
pCO2 arterial: 44 mmHg (ref 32.0–48.0)
pO2, Arterial: 52.2 mmHg — ABNORMAL LOW (ref 83.0–108.0)

## 2016-08-20 LAB — CBG MONITORING, ED
Glucose-Capillary: 75 mg/dL (ref 65–99)
Glucose-Capillary: 82 mg/dL (ref 65–99)

## 2016-08-20 LAB — COMPREHENSIVE METABOLIC PANEL
ALBUMIN: 2.8 g/dL — AB (ref 3.5–5.0)
ALK PHOS: 59 U/L (ref 38–126)
ALT: 13 U/L — AB (ref 17–63)
AST: 16 U/L (ref 15–41)
Anion gap: 10 (ref 5–15)
BUN: 38 mg/dL — AB (ref 6–20)
CALCIUM: 7.8 mg/dL — AB (ref 8.9–10.3)
CO2: 28 mmol/L (ref 22–32)
CREATININE: 3.13 mg/dL — AB (ref 0.61–1.24)
Chloride: 103 mmol/L (ref 101–111)
GFR calc Af Amer: 22 mL/min — ABNORMAL LOW (ref >60)
GFR calc non Af Amer: 19 mL/min — ABNORMAL LOW (ref >60)
Glucose, Bld: 74 mg/dL (ref 65–99)
Potassium: 3.4 mmol/L — ABNORMAL LOW (ref 3.5–5.1)
SODIUM: 141 mmol/L (ref 135–145)
Total Bilirubin: 0.5 mg/dL (ref 0.3–1.2)
Total Protein: 6.2 g/dL — ABNORMAL LOW (ref 6.5–8.1)

## 2016-08-20 LAB — URINALYSIS, ROUTINE W REFLEX MICROSCOPIC
Bilirubin Urine: NEGATIVE
GLUCOSE, UA: NEGATIVE mg/dL
HGB URINE DIPSTICK: NEGATIVE
Ketones, ur: NEGATIVE mg/dL
Leukocytes, UA: NEGATIVE
Nitrite: NEGATIVE
Specific Gravity, Urine: 1.01 (ref 1.005–1.030)
pH: 6 (ref 5.0–8.0)

## 2016-08-20 LAB — CBC WITH DIFFERENTIAL/PLATELET
BASOS PCT: 0 %
Basophils Absolute: 0 10*3/uL (ref 0.0–0.1)
EOS ABS: 0.2 10*3/uL (ref 0.0–0.7)
Eosinophils Relative: 2 %
HEMATOCRIT: 31.6 % — AB (ref 39.0–52.0)
Hemoglobin: 10.3 g/dL — ABNORMAL LOW (ref 13.0–17.0)
Lymphocytes Relative: 13 %
Lymphs Abs: 1.4 10*3/uL (ref 0.7–4.0)
MCH: 26.8 pg (ref 26.0–34.0)
MCHC: 32.6 g/dL (ref 30.0–36.0)
MCV: 82.1 fL (ref 78.0–100.0)
MONO ABS: 0.9 10*3/uL (ref 0.1–1.0)
MONOS PCT: 8 %
Neutro Abs: 8.4 10*3/uL — ABNORMAL HIGH (ref 1.7–7.7)
Neutrophils Relative %: 77 %
Platelets: 189 10*3/uL (ref 150–400)
RBC: 3.85 MIL/uL — ABNORMAL LOW (ref 4.22–5.81)
RDW: 15.6 % — AB (ref 11.5–15.5)
WBC: 10.9 10*3/uL — ABNORMAL HIGH (ref 4.0–10.5)

## 2016-08-20 LAB — URIC ACID: URIC ACID, SERUM: 12.2 mg/dL — AB (ref 4.4–7.6)

## 2016-08-20 LAB — TROPONIN I
TROPONIN I: 0.15 ng/mL — AB (ref <0.03)
TROPONIN I: 0.08 ng/mL — AB (ref <0.03)
TROPONIN I: 0.06 ng/mL — AB (ref <0.03)
Troponin I: 0.14 ng/mL (ref <0.03)
Troponin I: 0.12 ng/mL (ref <0.03)

## 2016-08-20 LAB — URINALYSIS, MICROSCOPIC (REFLEX)
BACTERIA UA: NONE SEEN
SQUAMOUS EPITHELIAL / LPF: NONE SEEN
WBC UA: NONE SEEN WBC/hpf (ref 0–5)

## 2016-08-20 LAB — BRAIN NATRIURETIC PEPTIDE: B Natriuretic Peptide: 251 pg/mL — ABNORMAL HIGH (ref 0.0–100.0)

## 2016-08-20 LAB — CREATININE, SERUM
Creatinine, Ser: 3.03 mg/dL — ABNORMAL HIGH (ref 0.61–1.24)
GFR, EST AFRICAN AMERICAN: 23 mL/min — AB (ref >60)
GFR, EST NON AFRICAN AMERICAN: 20 mL/min — AB (ref >60)

## 2016-08-20 MED ORDER — SODIUM CHLORIDE 0.9% FLUSH: 3.0000 mL | INTRAVENOUS | Status: DC | PRN

## 2016-08-20 MED ORDER — NICOTINE 21 MG/24HR TD PT24
21.0000 mg | MEDICATED_PATCH | Freq: Every day | TRANSDERMAL | Status: DC
  Administered 2016-08-20 – 2016-08-22 (×2): 21 mg via TRANSDERMAL
  Filled 2016-08-20 (×3): qty 1

## 2016-08-20 MED ORDER — CARVEDILOL 12.5 MG PO TABS
25.0000 mg | ORAL_TABLET | Freq: Two times a day (BID) | ORAL | Status: DC
  Administered 2016-08-20 – 2016-08-22 (×4): 25 mg via ORAL
  Filled 2016-08-20 (×4): qty 2

## 2016-08-20 MED ORDER — FUROSEMIDE 10 MG/ML IJ SOLN
60.0000 mg | Freq: Once | INTRAMUSCULAR | Status: AC
  Administered 2016-08-20: 60 mg via INTRAVENOUS

## 2016-08-20 MED ORDER — ISOSORBIDE MONONITRATE ER 60 MG PO TB24
120.0000 mg | ORAL_TABLET | Freq: Every day | ORAL | Status: DC
  Administered 2016-08-20 – 2016-08-22 (×3): 120 mg via ORAL
  Filled 2016-08-20 (×3): qty 2

## 2016-08-20 MED ORDER — SODIUM CHLORIDE 0.9% FLUSH
3.0000 mL | Freq: Two times a day (BID) | INTRAVENOUS | Status: DC
  Administered 2016-08-20 – 2016-08-22 (×5): 3 mL via INTRAVENOUS

## 2016-08-20 MED ORDER — DEXAMETHASONE SODIUM PHOSPHATE 10 MG/ML IJ SOLN
4.0000 mg | Freq: Once | INTRAMUSCULAR | Status: AC
  Administered 2016-08-20: 4 mg via INTRAVENOUS
  Filled 2016-08-20: qty 1

## 2016-08-20 MED ORDER — CLOPIDOGREL BISULFATE 75 MG PO TABS
75.0000 mg | ORAL_TABLET | Freq: Every day | ORAL | Status: DC
  Administered 2016-08-20 – 2016-08-22 (×3): 75 mg via ORAL
  Filled 2016-08-20 (×3): qty 1

## 2016-08-20 MED ORDER — ATORVASTATIN CALCIUM 20 MG PO TABS
20.0000 mg | ORAL_TABLET | Freq: Every day | ORAL | Status: DC
  Administered 2016-08-20 – 2016-08-22 (×3): 20 mg via ORAL
  Filled 2016-08-20 (×3): qty 1

## 2016-08-20 MED ORDER — IPRATROPIUM-ALBUTEROL 0.5-2.5 (3) MG/3ML IN SOLN
3.0000 mL | Freq: Four times a day (QID) | RESPIRATORY_TRACT | Status: DC
  Administered 2016-08-20 – 2016-08-21 (×5): 3 mL via RESPIRATORY_TRACT
  Filled 2016-08-20 (×6): qty 3

## 2016-08-20 MED ORDER — ONDANSETRON HCL 4 MG/2ML IJ SOLN
4.0000 mg | Freq: Four times a day (QID) | INTRAMUSCULAR | Status: DC | PRN
Start: 2016-08-20 — End: 2016-08-22

## 2016-08-20 MED ORDER — ENOXAPARIN SODIUM 40 MG/0.4ML ~~LOC~~ SOLN
40.0000 mg | SUBCUTANEOUS | Status: DC
  Administered 2016-08-20 – 2016-08-22 (×3): 40 mg via SUBCUTANEOUS
  Filled 2016-08-20 (×3): qty 0.4

## 2016-08-20 MED ORDER — INSULIN GLARGINE 100 UNIT/ML ~~LOC~~ SOLN
90.0000 [IU] | SUBCUTANEOUS | Status: DC
  Administered 2016-08-20 – 2016-08-22 (×3): 90 [IU] via SUBCUTANEOUS
  Filled 2016-08-20 (×5): qty 0.9

## 2016-08-20 MED ORDER — SODIUM CHLORIDE 0.9 % IV SOLN: 250.0000 mL | INTRAVENOUS | Status: DC | PRN

## 2016-08-20 MED ORDER — ASPIRIN EC 81 MG PO TBEC
81.0000 mg | DELAYED_RELEASE_TABLET | Freq: Every day | ORAL | Status: DC
  Administered 2016-08-20 – 2016-08-22 (×3): 81 mg via ORAL
  Filled 2016-08-20 (×3): qty 1

## 2016-08-20 MED ORDER — FUROSEMIDE 10 MG/ML IJ SOLN
40.0000 mg | Freq: Two times a day (BID) | INTRAMUSCULAR | Status: DC
  Administered 2016-08-20 – 2016-08-22 (×5): 40 mg via INTRAVENOUS
  Filled 2016-08-20 (×5): qty 4

## 2016-08-20 MED ORDER — TRAZODONE HCL 50 MG PO TABS
100.0000 mg | ORAL_TABLET | Freq: Every day | ORAL | Status: DC
  Administered 2016-08-20 – 2016-08-21 (×2): 100 mg via ORAL
  Filled 2016-08-20 (×2): qty 2

## 2016-08-20 MED ORDER — FUROSEMIDE 10 MG/ML IJ SOLN
INTRAMUSCULAR | Status: AC
  Filled 2016-08-20: qty 8

## 2016-08-20 MED ORDER — INSULIN ASPART 100 UNIT/ML ~~LOC~~ SOLN
50.0000 [IU] | Freq: Every day | SUBCUTANEOUS | Status: DC
  Administered 2016-08-20 – 2016-08-21 (×2): 50 [IU] via SUBCUTANEOUS

## 2016-08-20 MED ORDER — HYDRALAZINE HCL 25 MG PO TABS
75.0000 mg | ORAL_TABLET | Freq: Three times a day (TID) | ORAL | Status: DC
  Administered 2016-08-20 – 2016-08-22 (×7): 75 mg via ORAL
  Filled 2016-08-20 (×7): qty 3

## 2016-08-20 MED ORDER — ACETAMINOPHEN 325 MG PO TABS
650.0000 mg | ORAL_TABLET | ORAL | Status: DC | PRN
  Administered 2016-08-20: 650 mg via ORAL
  Filled 2016-08-20 (×2): qty 2

## 2016-08-20 NOTE — ED Provider Notes (Signed)
[facility] DEPT Provider Note   CSN: [id number] Arrival date & time: 08/20/16  0206  Time seen 02:12 AM   History   Chief Complaint Chief Complaint  Patient presents with  . Leg Swelling    HPI Gabriel Alvarez is a 66 y.o. male.  HPI  patient presents via EMS tonight. They report his pulse ox dropped into the 80s despite being on his usual oxygen at 4-5 L/m nasal cannula. He reportedly had a low blood sugar and ate candy PTA. EMS also reports fever to 100.7. Patient denies being short of breath but he does appear to be dyspneic. He states on February 26 he started having pain in his left thigh area. He states it has gotten progressively worse and tonight he is unable to walk on it. This prompted his call to EMS. He denies chest pain, swelling of his leg, back pain, chest pain, cough, sore throat, rhinorrhea, nausea, vomiting, or diarrhea. He describes sneezing all day, February 27. He saw his primary care doctor on February 22 with complaints of neuropathy in his feet and he was prescribed a new pill that he states he takes 2 in the morning and 1 in the evening. He states he's had back surgery 3 and has had radiculopathy in the past but states this feels different. He denies back pain now. He also states this feels different then his gout.  PCP Manon Hilding, MD   Past Medical History:  Diagnosis Date  . CEREBROVASCULAR ACCIDENT, ACUTE 09/05/2008  . CORONARY ARTERY DISEASE 12/30/2006  . DIABETES MELLITUS, TYPE II 12/30/2006  . DM neuropathy, type II diabetes mellitus ([place])   . ED (erectile dysfunction)   . GOUT 07/30/2008  . HYPERCHOLESTEROLEMIA 07/30/2008  . HYPERTENSION 12/30/2006    Patient Active Problem List   Diagnosis Date Noted  . Heart failure ([place]) 08/20/2016  . Chronic systolic CHF (congestive heart failure) ([place]) 07/31/2016  . CKD (chronic kidney disease), stage IV ([place]) 07/31/2016  . Diabetes mellitus due to underlying condition, uncontrolled, with hyperglycemia, with  long-term current use of insulin ([place]) 07/03/2016  . Morbid obesity ([place]) 07/03/2016  . Obesity hypoventilation syndrome ([place]) 07/03/2016  . Cardiomyopathy, ischemic 07/03/2016  . PAH (pulmonary artery hypertension) 07/03/2016  . Tobacco abuse 07/03/2016  . Pulmonary edema   . Diabetes ([place]) 09/15/2015  . Diabetes mellitus type 2 with peripheral artery disease ([place]) 10/29/2011  . CEREBROVASCULAR ACCIDENT, ACUTE 09/05/2008  . HYPERCHOLESTEROLEMIA 07/30/2008  . GOUT 07/30/2008  . SMOKER 07/30/2008  . Essential hypertension 12/30/2006  . Coronary artery disease involving native coronary artery of native heart without angina pectoris 12/30/2006    Past Surgical History:  Procedure Laterality Date  . CARDIAC CATHETERIZATION N/A 06/26/2016   Procedure: Right Heart Cath;  Surgeon: Larey Dresser, MD;  Location: [facility];  Service: Cardiovascular;  Laterality: N/A;  . CARDIAC CATHETERIZATION N/A 06/30/2016   Procedure: Right Heart Cath;  Surgeon: Larey Dresser, MD;  Location: [facility];  Service: Cardiovascular;  Laterality: N/A;  . hand sursgery  02/2102   right hand, 8 stitches       Home Medications    Prior to Admission medications   Medication Sig Start Date End Date Taking? Authorizing Provider  aspirin EC 81 MG tablet Take 1 tablet (81 mg total) by mouth daily. 07/17/16   Larey Dresser, MD  atorvastatin (LIPITOR) 20 MG tablet Take 1 tablet (20 mg total) by mouth daily. 07/20/16 07/15/17  Larey Dresser,  MD  B-D ULTRAFINE III SHORT PEN 31G X 8 MM MISC USE 1 PEN NEEDLE DAILY 04/24/15   Renato Shin, MD  BD INSULIN SYRINGE ULTRAFINE 31G X 5/16" 1 ML MISC USE 2 SYRINGES PER DAY 01/25/12   Renato Shin, MD  BD ULTRA-FINE PEN NEEDLES 29G X 12.7MM MISC USE 1 PER DAY 05/07/14   Renato Shin, MD  carvedilol (COREG) 25 MG tablet Take 1 tablet (25 mg total) by mouth 2 (two) times daily with a meal. 07/29/16   Larey Dresser, MD  clopidogrel (PLAVIX) 75 MG tablet Take 75 mg by  mouth daily.      Historical Provider, MD  hydrALAZINE (APRESOLINE) 25 MG tablet Take 3 tablets (75 mg total) by mouth 3 (three) times daily. 07/22/16   Larey Dresser, MD  insulin glargine (LANTUS) 100 UNIT/ML injection Inject 0.9 mLs (90 Units total) into the skin every morning. 06/16/16   Renato Shin, MD  insulin lispro (HUMALOG KWIKPEN) 100 UNIT/ML KiwkPen Inject 0.6 mLs (60 Units total) into the skin daily with supper. Patient taking differently: Inject 50 Units into the skin daily with supper.  06/16/16   Renato Shin, MD  ipratropium-albuterol (DUONEB) 0.5-2.5 (3) MG/3ML SOLN Inhale 3 mLs into the lungs 4 (four) times daily. 05/26/16   Historical Provider, MD  isosorbide mononitrate (IMDUR) 120 MG 24 hr tablet Take 1 tablet (120 mg total) by mouth daily. 07/17/16   Larey Dresser, MD  nicotine (NICODERM CQ - DOSED IN MG/24 HOURS) 21 mg/24hr patch Place 1 patch (21 mg total) onto the skin daily. 07/04/16   Nishant Dhungel, MD  torsemide (DEMADEX) 20 MG tablet Take 2 tablets (40 mg total) by mouth daily. 07/29/16   Larey Dresser, MD  traZODone (DESYREL) 100 MG tablet Take 100 mg by mouth at bedtime. Reported on 07/18/2015    Historical Provider, MD    Family History Family History  Problem Relation Age of Onset  . Diabetes Father     Social History Social History  Substance Use Topics  . Smoking status: Former Smoker    Packs/day: 1.00    Years: 30.00    Types: Cigarettes, Cigars    Start date: 06/21/2016  . Smokeless tobacco: Never Used     Comment: has stopped smoking cigarettes since last hospital stay  . Alcohol use No  lives at home Lives with spouse Oxygen 4-5 lpm [place]   Allergies   Patient has no known allergies.   Review of Systems Review of Systems  All other systems reviewed and are negative.    Physical Exam Updated Vital Signs BP 143/64 (BP Location: Left Arm)   Pulse 69   Temp 98.9 F (37.2 C) (Oral)   Wt 290 lb (131.5 kg)   SpO2 96%   BMI 40.45 kg/m    Vital signs normal    Physical Exam  Constitutional: He is oriented to person, place, and time. He appears well-developed and well-nourished.  Non-toxic appearance. He does not appear ill. No distress.  obese  HENT:  Head: Normocephalic and atraumatic.  Right Ear: External ear normal.  Left Ear: External ear normal.  Nose: Nose normal. No mucosal edema or rhinorrhea.  Mouth/Throat: Mucous membranes are normal. No dental abscesses or uvula swelling.  Dry tongue  Eyes: Conjunctivae and EOM are normal. Pupils are equal, round, and reactive to light.  Neck: Normal range of motion and full passive range of motion without pain. Neck supple.  Cardiovascular: Normal rate, regular rhythm and  normal heart sounds.  Exam reveals no gallop and no friction rub.   No murmur heard. Pulmonary/Chest: Effort normal. Tachypnea noted. No respiratory distress. He has decreased breath sounds. He has no wheezes. He has no rhonchi. He has no rales. He exhibits no tenderness and no crepitus.  Abdominal: Soft. Normal appearance and bowel sounds are normal. He exhibits no distension. There is no tenderness. There is no rebound and no guarding.  Musculoskeletal: Normal range of motion. He exhibits no edema or tenderness.       Legs: Moves all extremities well except his left lower extremity. He complains of pain along the lateral aspect of his proximal thigh that goes down to his knee. He does not have pain in the true hip joint. He has hyperpigmentation and chronic thickening of the skin of his lower extremities. There is trace pitting edema bilaterally. When he tries to flex his knee he states it's painful in the knee. He has no obvious effusion. There is no warmth or redness over the knee.  Nontender in the lumbar spine.  Neurological: He is alert and oriented to person, place, and time. He has normal strength. No cranial nerve deficit.  Skin: Skin is warm, dry and intact. No rash noted. No erythema. No pallor.    Psychiatric: He has a normal mood and affect. His speech is normal and behavior is normal. His mood appears not anxious.  Nursing note and vitals reviewed.    ED Treatments / Results  Labs (all labs ordered are listed, but only abnormal results are displayed) Results for orders placed or performed during the hospital encounter of 08/20/16  Brain natriuretic peptide  Result Value Ref Range   B Natriuretic Peptide 251.0 (H) 0.0 - 100.0 pg/mL  Comprehensive metabolic panel  Result Value Ref Range   Sodium 141 135 - 145 mmol/L   Potassium 3.4 (L) 3.5 - 5.1 mmol/L   Chloride 103 101 - 111 mmol/L   CO2 28 22 - 32 mmol/L   Glucose, Bld 74 65 - 99 mg/dL   BUN 38 (H) 6 - 20 mg/dL   Creatinine, Ser 3.13 (H) 0.61 - 1.24 mg/dL   Calcium 7.8 (L) 8.9 - 10.3 mg/dL   Total Protein 6.2 (L) 6.5 - 8.1 g/dL   Albumin 2.8 (L) 3.5 - 5.0 g/dL   AST 16 15 - 41 U/L   ALT 13 (L) 17 - 63 U/L   Alkaline Phosphatase 59 38 - 126 U/L   Total Bilirubin 0.5 0.3 - 1.2 mg/dL   GFR calc non Af Amer 19 (L) >60 mL/min   GFR calc Af Amer 22 (L) >60 mL/min   Anion gap 10 5 - 15  CBC with Differential  Result Value Ref Range   WBC 10.9 (H) 4.0 - 10.5 K/uL   RBC 3.85 (L) 4.22 - 5.81 MIL/uL   Hemoglobin 10.3 (L) 13.0 - 17.0 g/dL   HCT 31.6 (L) 39.0 - 52.0 %   MCV 82.1 78.0 - 100.0 fL   MCH 26.8 26.0 - 34.0 pg   MCHC 32.6 30.0 - 36.0 g/dL   RDW 15.6 (H) 11.5 - 15.5 %   Platelets 189 150 - 400 K/uL   Neutrophils Relative % 77 %   Neutro Abs 8.4 (H) 1.7 - 7.7 K/uL   Lymphocytes Relative 13 %   Lymphs Abs 1.4 0.7 - 4.0 K/uL   Monocytes Relative 8 %   Monocytes Absolute 0.9 0.1 - 1.0 K/uL   Eosinophils Relative 2 %  Eosinophils Absolute 0.2 0.0 - 0.7 K/uL   Basophils Relative 0 %   Basophils Absolute 0.0 0.0 - 0.1 K/uL  Troponin I  Result Value Ref Range   Troponin I 0.15 (HH) <0.03 ng/mL  Uric acid  Result Value Ref Range   Uric Acid, Serum 12.2 (H) 4.4 - 7.6 mg/dL  Urinalysis, Routine w reflex  microscopic  Result Value Ref Range   Color, Urine YELLOW YELLOW   APPearance CLEAR CLEAR   Specific Gravity, Urine 1.010 1.005 - 1.030   pH 6.0 5.0 - 8.0   Glucose, UA NEGATIVE NEGATIVE mg/dL   Hgb urine dipstick NEGATIVE NEGATIVE   Bilirubin Urine NEGATIVE NEGATIVE   Ketones, ur NEGATIVE NEGATIVE mg/dL   Protein, ur >300 (A) NEGATIVE mg/dL   Nitrite NEGATIVE NEGATIVE   Leukocytes, UA NEGATIVE NEGATIVE  Troponin I  Result Value Ref Range   Troponin I 0.14 (HH) <0.03 ng/mL  Blood gas, arterial  Result Value Ref Range   O2 Content 6.0 L/min   Delivery systems NASAL CANNULA    pH, Arterial 7.408 7.350 - 7.450   pCO2 arterial 44.0 32.0 - 48.0 mmHg   pO2, Arterial 52.2 (L) 83.0 - 108.0 mmHg   Bicarbonate 26.4 20.0 - 28.0 mmol/L   Acid-Base Excess 2.9 (H) 0.0 - 2.0 mmol/L   O2 Saturation 84.1 %   Collection site RIGHT RADIAL    Drawn by [id number]    Sample type ARTERIAL    Allens test (pass/fail) PASS PASS  Urinalysis, Microscopic (reflex)  Result Value Ref Range   RBC / HPF 0-5 0 - 5 RBC/hpf   WBC, UA NONE SEEN 0 - 5 WBC/hpf   Bacteria, UA NONE SEEN NONE SEEN   Squamous Epithelial / LPF NONE SEEN NONE SEEN   Urine-Other HYALINE CASTS   CBG monitoring, ED  Result Value Ref Range   Glucose-Capillary 75 65 - 99 mg/dL  CBG monitoring, ED  Result Value Ref Range   Glucose-Capillary 82 65 - 99 mg/dL   Laboratory interpretation all normal except Stable mild elevation of BNP, stable renal insufficiency, stable anemia, elevated troponin which he's had in the past, and very elevated uric acid level, proteinuria, delta troponin mildly lower, ABG shows hypoxia   EKG  EKG Interpretation  Date/Time:  Thursday August 20 2016 02:25:21 EST Ventricular Rate:  69 PR Interval:    QRS Duration: 87 QT Interval:  416 QTC Calculation: 446 R Axis:   70 Text Interpretation:  Sinus rhythm Borderline repol abnormality, diffuse leads Baseline wander No significant change since last tracing 24 Jun 2016 Confirmed by [facility]  MD-I, Kerri Kovacik ([id number]) on 08/20/2016 2:33:33 AM       Radiology Dg Chest 2 View  Result Date: 08/20/2016 CLINICAL DATA:  Shortness of breath. EXAM: CHEST  2 VIEW COMPARISON:  Radiograph 07/20/2016 FINDINGS: Cardiomegaly has progressed from prior exam. There is pulmonary edema and minimal fluid in the fissures. Streaky bibasilar opacities likely atelectasis. No confluent airspace disease. No pneumothorax. IMPRESSION: Increased cardiomegaly with pulmonary edema, consistent with CHF. Electronically Signed   By: Jeb Levering M.D.   On: 08/20/2016 03:15   Dg Knee Complete 4 Views Left  Result Date: 08/20/2016 CLINICAL DATA:  Left hip and knee pain. Awoke unable to walk. Swelling. EXAM: LEFT KNEE - COMPLETE 4+ VIEW COMPARISON:  None. FINDINGS: No acute fracture or subluxation. Bipartite patella with mild patellar spurring. No abnormal density, bony destructive change or periosteal reaction. Mild medial tibiofemoral joint space narrowing. Small knee  joint effusion. Diffuse soft tissue edema. Vascular calcifications are seen. No soft tissue air. IMPRESSION: 1. Small knee joint effusion and diffuse soft tissue edema. 2. No acute osseous abnormality.  Mild osteoarthritis. Electronically Signed   By: Jeb Levering M.D.   On: 08/20/2016 03:11   Dg Hip Unilat With Pelvis 2-3 Views Left  Result Date: 08/20/2016 CLINICAL DATA:  Left hip pain.  Awoke today unable to walk. EXAM: DG HIP (WITH OR WITHOUT PELVIS) 2-3V LEFT COMPARISON:  None. FINDINGS: The cortical margins of the bony pelvis are intact. No fracture. Pubic symphysis and sacroiliac joints are congruent. Both femoral heads are well-seated in the respective acetabula. No abnormal density or bony destructive change. Mild osteoarthritis of both hip joints. There are vascular calcifications. IMPRESSION: Mild osteoarthritis.  No acute bony abnormality. Electronically Signed   By: Jeb Levering M.D.   On: 08/20/2016 03:13     Procedures Procedures (including critical care time)  Medications Ordered in ED Medications  dexamethasone (DECADRON) injection 4 mg (4 mg Intravenous Given 08/20/16 0645)  furosemide (LASIX) injection 60 mg (60 mg Intravenous Given 08/20/16 0713)     Initial Impression / Assessment and Plan / ED Course  I have reviewed the triage vital signs and the nursing notes.  Pertinent labs & imaging results that were available during my care of the patient were reviewed by me and considered in my medical decision making (see chart for details).  02:50 AM wife here, patient was started on gabapentin on 2/23. She reports tonight she got home from working second shift and he was unable to get out of his chair b/o pain in his thigh. She checked his CBG and it was 46 and she gave him OJ and candy. His temperature was 101.2 at home.   04:30 AM discussed his test results with patient and his wife. Wife states he has a history of gout although patient states he was told he didn't have gout. She states when he was diagnosed with congestive heart failure they stopped his allopurinol but more than likely it was because of his renal insufficiency. Patient states he has had fluid drained off that knee in the past by his PCP. Patient was given IV Decadron to see if that would help with his pain in his knee. I suspect he is having an acute gouty flareup in his knee. His temperature was repeated and he remained afebrile in the ED. His repeat CBG was 82.  Delta troponin was done and hopefully it will be the same or lower.  05:45 AM Decadron has not been giving it by nursing staff. Patient did finally give a urine sample. Nursing staff reports patients pulse ox is been dropping down to 84% despite being on his nasal cannula oxygen. ABG was ordered. Patient continues denying feeling short of breath.  6:45 AM I have reviewed patient's prior discharge information and he was diagnosed with primary hypertension and CHF. He  was discharged from the hospital on high flow oxygen at 15 L/m nasal cannula. He was to use BiPap at night. Patient states he has two concentrators at home however he has not needed them recently. His wife reports that his pulse ox has been good at 94-95% just on the 4-5 L/m nasal cannula (wife has a pulse ox monitor) . Patient was given IV Lasix. I'm going to talk to the hospitalist about admission.  07:22 AM Dr Alfredia Ferguson, hospitalist, will admit   Jun 30, 2016 Right Heart Pressures [facility] Procedural Findings:  Hemodynamics (mmHg) RA mean 3 RV 70/6 PA 78/21, mean 45 PCWP mean 7  Oxygen saturations: PA 72% RV 72% RA 69% AO 88%  Cardiac Output (Fick) 9.82  Cardiac Index (Fick) 3.84 PVR 3.9 WU    Jun 26, 2016 Procedures   Right Heart Cath  Conclusion   1. Mildly elevated right and left heart filling pressures.  2. Severe pulmonary arterial hypertension.   This is most likely primarily Group 3 PH, due to OHS/OSA (strongly suspected).  Pulmonary vasodilators are unlikely to help much.  He remains mildly hypoxemic.  Need to increase oxygen saturation during the day and use Bipap at night as main therapies.  Would obtain V/Q scan when more stable, however.   Would continue diuresis today, reassess tomorrow.      Final Clinical Impressions(s) / ED Diagnoses   Final diagnoses:  Hypoxia  Acute gout of left knee, unspecified cause  Pulmonary hypertension  Acute on chronic congestive heart failure, unspecified congestive heart failure type ([place])  Hypoglycemia  Fever, unspecified fever cause    Plan admission  Rolland Porter, MD, Barbette Or, MD 08/20/16 [phone]

## 2016-08-20 NOTE — ED Triage Notes (Addendum)
He woke up this morning, unable to walk, having pain and swelling in lower extremities.  Temperature was 100.7 per EMS.  Ibuprofen 400 mg was given by EMS.

## 2016-08-20 NOTE — H&P (Signed)
History and Physical    Gabriel Alvarez DXA:[id number] DOB: 23-Mar-1951 DOA: 08/20/2016  PCP: Manon Hilding, MD   Patient coming from: Home  Chief Complaint: Leg/Knee Pain and Worsening Swelling  HPI: Gabriel Alvarez is a 66 y.o. male with medical history significant of HTN, Gout, HLD, Chronic Systolic CHF with EF of 20-94%, CAD, OHS/OSA, pHTN, Insulin Dependent Diabetes and other comorbidities who presented to [facility] with Leg Pain and inability to get out of bed. Patient states he developed severe knee pain and was unable to ambulate. Denied SOB but O2 Saturations were lower on baseline O2. Also complained of some increase in leg swelling. No nausea, vomiting, or Cp. States symptoms started yesterday and got progressively worse. Per patient had a slight temperature overnight and wife had to give him some snackes because CBG was low. Denied any other complaints or concerns and TR[facility] was asked to admit for Acute on Chronic Respiratory Failure, Suspected CHF Exacerbation, and Severe Left Knee Pain.   ED Course: Had Labwork done, Imaging of Chest and Knee, and was given IV Diuresis of 60 mg of Lasix.  Review of Systems: As per HPI otherwise 10 point review of systems negative. Denied Lightheadedness or dizziness. States knee felt better after IV Steroids.   Past Medical History:  Diagnosis Date  . CAD (coronary artery disease)    Remote stent to RCA, 50% in-stent restenosis by cardiac catheterization 2005  . Cardiomyopathy ([place])    LVEF 15-20%, likely nonischemic  . CKD (chronic kidney disease) stage 4, GFR 15-29 ml/min (HCC)   . ED (erectile dysfunction)   . Essential hypertension   . Gout   . Hyperlipidemia   . Hypoventilation syndrome    Chronic hypoxic respiratory failure  . OSA (obstructive sleep apnea)   . Pulmonary hypertension    Secondary to OSA/OHS, severe by [place] 06/2016  . Type 2 diabetes mellitus ([place])     Past Surgical History:  Procedure Laterality Date  . CARDIAC  CATHETERIZATION N/A 06/26/2016   Procedure: Right Heart Cath;  Surgeon: Larey Dresser, MD;  Location: [place] [facility];  Service: Cardiovascular;  Laterality: N/A;  . CARDIAC CATHETERIZATION N/A 06/30/2016   Procedure: Right Heart Cath;  Surgeon: Larey Dresser, MD;  Location: [place] [facility];  Service: Cardiovascular;  Laterality: N/A;  . HAND SURGERY  02/2102   SOCIAL HISTORY  reports that he has quit smoking. His smoking use included Cigarettes and Cigars. He started smoking about 1 months ago. He has a 30.00 pack-year smoking history. He has never used smokeless tobacco. He reports that he does not drink alcohol or use drugs.  No Known Allergies  Family History  Problem Relation Age of Onset  . Diabetes Father    Prior to Admission medications   Medication Sig Start Date End Date Taking? Authorizing Provider  aspirin EC 81 MG tablet Take 1 tablet (81 mg total) by mouth daily. 07/17/16   Larey Dresser, MD  atorvastatin (LIPITOR) 20 MG tablet Take 1 tablet (20 mg total) by mouth daily. 07/20/16 07/15/17  Larey Dresser, MD  B-D ULTRAFINE III SHORT PEN 31G X 8 MM MISC USE 1 PEN NEEDLE DAILY 04/24/15   Renato Shin, MD  BD INSULIN SYRINGE ULTRAFINE 31G X 5/16" 1 ML MISC USE 2 SYRINGES PER DAY 01/25/12   Renato Shin, MD  BD ULTRA-FINE PEN NEEDLES 29G X 12.7MM MISC USE 1 PER DAY 05/07/14   Renato Shin, MD  carvedilol (COREG) 25  MG tablet Take 1 tablet (25 mg total) by mouth 2 (two) times daily with a meal. 07/29/16   Larey Dresser, MD  clopidogrel (PLAVIX) 75 MG tablet Take 75 mg by mouth daily.      Historical Provider, MD  hydrALAZINE (APRESOLINE) 25 MG tablet Take 3 tablets (75 mg total) by mouth 3 (three) times daily. 07/22/16   Larey Dresser, MD  insulin glargine (LANTUS) 100 UNIT/ML injection Inject 0.9 mLs (90 Units total) into the skin every morning. 06/16/16   Renato Shin, MD  insulin lispro (HUMALOG KWIKPEN) 100 UNIT/ML KiwkPen Inject 0.6 mLs (60 Units total) into the skin daily  with supper. Patient taking differently: Inject 50 Units into the skin daily with supper.  06/16/16   Renato Shin, MD  ipratropium-albuterol (DUONEB) 0.5-2.5 (3) MG/3ML SOLN Inhale 3 mLs into the lungs 4 (four) times daily. 05/26/16   Historical Provider, MD  isosorbide mononitrate (IMDUR) 120 MG 24 hr tablet Take 1 tablet (120 mg total) by mouth daily. 07/17/16   Larey Dresser, MD  nicotine (NICODERM CQ - DOSED IN MG/24 HOURS) 21 mg/24hr patch Place 1 patch (21 mg total) onto the skin daily. 07/04/16   Nishant Dhungel, MD  torsemide (DEMADEX) 20 MG tablet Take 2 tablets (40 mg total) by mouth daily. 07/29/16   Larey Dresser, MD  traZODone (DESYREL) 100 MG tablet Take 100 mg by mouth at bedtime. Reported on 07/18/2015    Historical Provider, MD   Physical Exam: Vitals:   08/20/16 1038 08/20/16 1050 08/20/16 1358 08/20/16 1605  BP:      Pulse:      Resp:      Temp:      TempSrc:      SpO2: (!) 89%  (!) 89% (!) 79%  Weight:      Height:  5' 11" (1.803 m)     Constitutional: obese AAM who is NAD and appears calm  Eyes: Lids and conjunctivae normal, sclerae anicteric  ENMT: External Ears, Nose appear normal. Grossly normal hearing. Mucous membranes are moist.  Neck: Appears normal, supple, no cervical masses, normal ROM, no appreciable thyromegaly, mild JVD Respiratory: Diminished to auscultation bilaterally, no wheezing, rales, rhonchi or crackles. Normal respiratory effort and patient is not tachypenic. No accessory muscle use. Wearing supplemental O2 via [place].  Cardiovascular: RRR, no murmurs / rubs / gallops. S1 and S2 auscultated. 1+ Lower Extremity edema with venous stasis changes Abdomen: Soft, non-tender, distended due to body habitus. No masses palpated. No appreciable hepatosplenomegaly. Bowel sounds positive x4.  GU: Deferred. Musculoskeletal: No clubbing / cyanosis of digits/nails. No joint deformity upper and lower extremities. Painful Left Knee on ROM testing Skin: No rashes,  lesions, ulcers on limited skin evaluation. No induration; Warm and dry.  Neurologic: CN 2-12 grossly intact with no focal deficits. Romberg sign cerebellar reflexes not assessed.  Psychiatric: Normal judgment and insight. Alert and oriented x 3. Normal mood and appropriate affect.   Labs on Admission: I have personally reviewed following labs and imaging studies  CBC:  Recent Labs Lab 08/20/16 0259 08/20/16 0947  WBC 10.9* 10.4  NEUTROABS 8.4*  --   HGB 10.3* 10.7*  HCT 31.6* 32.9*  MCV 82.1 82.5  PLT 189 644   Basic Metabolic Panel:  Recent Labs Lab 08/20/16 0259 08/20/16 0947  NA 141  --   K 3.4*  --   CL 103  --   CO2 28  --   GLUCOSE 74  --  BUN 38*  --   CREATININE 3.13* 3.03*  CALCIUM 7.8*  --    GFR: Estimated Creatinine Clearance: 33.5 mL/min (by C-G formula based on SCr of 3.03 mg/dL (H)). Liver Function Tests:  Recent Labs Lab 08/20/16 0259  AST 16  ALT 13*  ALKPHOS 59  BILITOT 0.5  PROT 6.2*  ALBUMIN 2.8*   No results for input(s): LIPASE, AMYLASE in the last 168 hours. No results for input(s): AMMONIA in the last 168 hours. Coagulation Profile: No results for input(s): INR, PROTIME in the last 168 hours. Cardiac Enzymes:  Recent Labs Lab 08/20/16 0259 08/20/16 0503 08/20/16 0947 08/20/16 1523  TROPONINI 0.15* 0.14* 0.12* 0.08*   BNP (last 3 results) No results for input(s): PROBNP in the last 8760 hours. HbA1C: No results for input(s): HGBA1C in the last 72 hours. CBG:  Recent Labs Lab 08/20/16 0217 08/20/16 0420  GLUCAP 75 82   Lipid Profile: No results for input(s): CHOL, HDL, LDLCALC, TRIG, CHOLHDL, LDLDIRECT in the last 72 hours. Thyroid Function Tests: No results for input(s): TSH, T4TOTAL, FREET4, T3FREE, THYROIDAB in the last 72 hours. Anemia Panel: No results for input(s): VITAMINB12, FOLATE, FERRITIN, TIBC, IRON, RETICCTPCT in the last 72 hours. Urine analysis:    Component Value Date/Time   COLORURINE YELLOW  08/20/2016 0559   APPEARANCEUR CLEAR 08/20/2016 0559   LABSPEC 1.010 08/20/2016 0559   PHURINE 6.0 08/20/2016 0559   GLUCOSEU NEGATIVE 08/20/2016 0559   HGBUR NEGATIVE 08/20/2016 0559   BILIRUBINUR NEGATIVE 08/20/2016 0559   KETONESUR NEGATIVE 08/20/2016 0559   PROTEINUR >300 (A) 08/20/2016 0559   NITRITE NEGATIVE 08/20/2016 0559   LEUKOCYTESUR NEGATIVE 08/20/2016 0559   Sepsis Labs: !!!!!!!!!!!!!!!!!!!!!!!!!!!!!!!!!!!!!!!!!!!! _0 (procalcitonin:4,lacticidven:4) )No results found for this or any previous visit (from the past 240 hour(s)).   Radiological Exams on Admission: Dg Chest 2 View  Result Date: 08/20/2016 CLINICAL DATA:  Shortness of breath. EXAM: CHEST  2 VIEW COMPARISON:  Radiograph 07/20/2016 FINDINGS: Cardiomegaly has progressed from prior exam. There is pulmonary edema and minimal fluid in the fissures. Streaky bibasilar opacities likely atelectasis. No confluent airspace disease. No pneumothorax. IMPRESSION: Increased cardiomegaly with pulmonary edema, consistent with CHF. Electronically Signed   By: Jeb Levering M.D.   On: 08/20/2016 03:15   Dg Knee Complete 4 Views Left  Result Date: 08/20/2016 CLINICAL DATA:  Left hip and knee pain. Awoke unable to walk. Swelling. EXAM: LEFT KNEE - COMPLETE 4+ VIEW COMPARISON:  None. FINDINGS: No acute fracture or subluxation. Bipartite patella with mild patellar spurring. No abnormal density, bony destructive change or periosteal reaction. Mild medial tibiofemoral joint space narrowing. Small knee joint effusion. Diffuse soft tissue edema. Vascular calcifications are seen. No soft tissue air. IMPRESSION: 1. Small knee joint effusion and diffuse soft tissue edema. 2. No acute osseous abnormality.  Mild osteoarthritis. Electronically Signed   By: Jeb Levering M.D.   On: 08/20/2016 03:11   Dg Hip Unilat With Pelvis 2-3 Views Left  Result Date: 08/20/2016 CLINICAL DATA:  Left hip pain.  Awoke today unable to walk. EXAM: DG HIP  (WITH OR WITHOUT PELVIS) 2-3V LEFT COMPARISON:  None. FINDINGS: The cortical margins of the bony pelvis are intact. No fracture. Pubic symphysis and sacroiliac joints are congruent. Both femoral heads are well-seated in the respective acetabula. No abnormal density or bony destructive change. Mild osteoarthritis of both hip joints. There are vascular calcifications. IMPRESSION: Mild osteoarthritis.  No acute bony abnormality. Electronically Signed   By: Jeb Levering M.D.   On:  08/20/2016 03:13   EKG: Independently reviewed. Showed Sinus Rhythm with Rate of 69 and no evidence of ST Elevation or Depression on my interpretation.   Assessment/Plan Active Problems:   Heart failure (HCC)   Acute on chronic respiratory failure (HCC)  Acute on Chronic Hypoxic Respiratory Failure likely Multifactorial and from pHTN, CHF Exacerbation, and OHS/OSA -Admit to Medical Floor with Telemetry -Desaturated on Normal 4-5 Liters to 84% -CXR showed Increased Cardiomegaly with Pulmonary Edema, consistent with CHF -Continue Supplemental O2 via [place] -DuoNeb Breathing Tx 4 Times daily -Check Patient for the Flu and Viral Respiratory Panel as he had mild fever -ABG showed Hypoxemia  -Maintain O2 Saturations >92% -CPAP ordered for qHS and prn  -Appreciate Pulmonary Evaluation  Suspected Acute Decompensation of Chronic Systolic Heart failure with EF of 15-20% -ECHO and Right Sided Cath Done recently in January -Strict I's and O's, Daily Weights, SLIV, and Fluid Restrict -CXR showed Increased Cardiomegaly with Pulmonary Edema, consistent with CHF -IV Diuresis with Lasix 40 mg BID (was given IV 60 mg in ED); Was on Demadex 40 mg Daily as an outpatient  -BNP was 251.0 -C/w Imdur 120 mg po Daily and Hydralazine 75 mg po TID -Appreciate Cardiology Consultation and Evaluation  Left Leg and Left Knee Pain, ? Gout Flare -Uric Acid Level was 12.2; Unable to give Allopurinol because of Kidney Fxn -X-Ray of Hip showed  mild osteoarthritis and no acute bony abnormality, and X-Ray of knee showed small knee joint effusion and diffuse soft tissue edema with no acute osseous abnormality and mild osteoarthritis -Given IV Dexamethasone 4 mg -Get Ortho Evaluation for possible joint tap and PT Evaluation  Elevated Troponin -In the setting of CKD and Heart Failure -Troponin Trending down from 0.15 -> 0.08 -No Chest Pain  Hypokalemia -Patient's K+ was 3.4 -Replete Cautiously -Repeat CMP in AM  OHS/OSA -Ordered CPAP -Appreciate Pulmonary Evaluation  CAD with RCA Stenting -C/w ASA 81 mg po daily, Atorvastatin 20 mg Daily, Coreg 25 mg po BID  Diabetes Type 2 -C/w Lantus 90 units and with Novolog 50 units sq Daily -Continue to Monitor CBG's closely  CKD Stage IV -Patient's BUN/Cr was 38/3.13 and Repeat Cr was 3.03 -Continue to Monitor CMP and watch Cr and UOP as patient is being diuresed  Pulmonary HTN -Had recent RHC in January which showed PASP of 78 mmHg -Appreciate Cardiology and Pulmonary Evaluation  DVT prophylaxis: Lovenox 40 mg sq q24h Code Status: FULL CODE Family Communication: No Family present at bedside Disposition Plan: Pending PT Evaluation Consults called: Cardiology Dr. Domenic Polite, Pulmonary, Orthopedic Surgery Admission status: [place], D.O. [facility] Pager [phone]  If 7PM-7AM, please contact night-coverage [email or web address] Password [facility]  08/20/2016, 5:31 PM

## 2016-08-20 NOTE — H&P (Signed)
History and Physical    Gabriel Alvarez DXA:[id number] DOB: 23-Mar-1951 DOA: 08/20/2016  PCP: Manon Hilding, MD   Patient coming from: Home  Chief Complaint: Leg/Knee Pain and Worsening Swelling  HPI: Gabriel Alvarez is a 66 y.o. male with medical history significant of HTN, Gout, HLD, Chronic Systolic CHF with EF of 20-94%, CAD, OHS/OSA, pHTN, Insulin Dependent Diabetes and other comorbidities who presented to [facility] with Leg Pain and inability to get out of bed. Patient states he developed severe knee pain and was unable to ambulate. Denied SOB but O2 Saturations were lower on baseline O2. Also complained of some increase in leg swelling. No nausea, vomiting, or Cp. States symptoms started yesterday and got progressively worse. Per patient had a slight temperature overnight and wife had to give him some snackes because CBG was low. Denied any other complaints or concerns and TRH was asked to admit for Acute on Chronic Respiratory Failure, Suspected CHF Exacerbation, and Severe Left Knee Pain.   ED Course: Had Labwork done, Imaging of Chest and Knee, and was given IV Diuresis of 60 mg of Lasix.  Review of Systems: As per HPI otherwise 10 point review of systems negative. Denied Lightheadedness or dizziness. States knee felt better after IV Steroids.   Past Medical History:  Diagnosis Date  . CAD (coronary artery disease)    Remote stent to RCA, 50% in-stent restenosis by cardiac catheterization 2005  . Cardiomyopathy ([place])    LVEF 15-20%, likely nonischemic  . CKD (chronic kidney disease) stage 4, GFR 15-29 ml/min (HCC)   . ED (erectile dysfunction)   . Essential hypertension   . Gout   . Hyperlipidemia   . Hypoventilation syndrome    Chronic hypoxic respiratory failure  . OSA (obstructive sleep apnea)   . Pulmonary hypertension    Secondary to OSA/OHS, severe by [place] 06/2016  . Type 2 diabetes mellitus ([place])     Past Surgical History:  Procedure Laterality Date  . CARDIAC  CATHETERIZATION N/A 06/26/2016   Procedure: Right Heart Cath;  Surgeon: Larey Dresser, MD;  Location: [place] [facility];  Service: Cardiovascular;  Laterality: N/A;  . CARDIAC CATHETERIZATION N/A 06/30/2016   Procedure: Right Heart Cath;  Surgeon: Larey Dresser, MD;  Location: [place] [facility];  Service: Cardiovascular;  Laterality: N/A;  . HAND SURGERY  02/2102   SOCIAL HISTORY  reports that he has quit smoking. His smoking use included Cigarettes and Cigars. He started smoking about 1 months ago. He has a 30.00 pack-year smoking history. He has never used smokeless tobacco. He reports that he does not drink alcohol or use drugs.  No Known Allergies  Family History  Problem Relation Age of Onset  . Diabetes Father    Prior to Admission medications   Medication Sig Start Date End Date Taking? Authorizing Provider  aspirin EC 81 MG tablet Take 1 tablet (81 mg total) by mouth daily. 07/17/16   Larey Dresser, MD  atorvastatin (LIPITOR) 20 MG tablet Take 1 tablet (20 mg total) by mouth daily. 07/20/16 07/15/17  Larey Dresser, MD  B-D ULTRAFINE III SHORT PEN 31G X 8 MM MISC USE 1 PEN NEEDLE DAILY 04/24/15   Renato Shin, MD  BD INSULIN SYRINGE ULTRAFINE 31G X 5/16" 1 ML MISC USE 2 SYRINGES PER DAY 01/25/12   Renato Shin, MD  BD ULTRA-FINE PEN NEEDLES 29G X 12.7MM MISC USE 1 PER DAY 05/07/14   Renato Shin, MD  carvedilol (COREG) 25  MG tablet Take 1 tablet (25 mg total) by mouth 2 (two) times daily with a meal. 07/29/16   Larey Dresser, MD  clopidogrel (PLAVIX) 75 MG tablet Take 75 mg by mouth daily.      Historical Provider, MD  hydrALAZINE (APRESOLINE) 25 MG tablet Take 3 tablets (75 mg total) by mouth 3 (three) times daily. 07/22/16   Larey Dresser, MD  insulin glargine (LANTUS) 100 UNIT/ML injection Inject 0.9 mLs (90 Units total) into the skin every morning. 06/16/16   Renato Shin, MD  insulin lispro (HUMALOG KWIKPEN) 100 UNIT/ML KiwkPen Inject 0.6 mLs (60 Units total) into the skin daily  with supper. Patient taking differently: Inject 50 Units into the skin daily with supper.  06/16/16   Renato Shin, MD  ipratropium-albuterol (DUONEB) 0.5-2.5 (3) MG/3ML SOLN Inhale 3 mLs into the lungs 4 (four) times daily. 05/26/16   Historical Provider, MD  isosorbide mononitrate (IMDUR) 120 MG 24 hr tablet Take 1 tablet (120 mg total) by mouth daily. 07/17/16   Larey Dresser, MD  nicotine (NICODERM CQ - DOSED IN MG/24 HOURS) 21 mg/24hr patch Place 1 patch (21 mg total) onto the skin daily. 07/04/16   Nishant Dhungel, MD  torsemide (DEMADEX) 20 MG tablet Take 2 tablets (40 mg total) by mouth daily. 07/29/16   Larey Dresser, MD  traZODone (DESYREL) 100 MG tablet Take 100 mg by mouth at bedtime. Reported on 07/18/2015    Historical Provider, MD   Physical Exam: Vitals:   08/20/16 1038 08/20/16 1050 08/20/16 1358 08/20/16 1605  BP:      Pulse:      Resp:      Temp:      TempSrc:      SpO2: (!) 89%  (!) 89% (!) 79%  Weight:      Height:  5' 11" (1.803 m)     Constitutional: obese AAM who is NAD and appears calm  Eyes: Lids and conjunctivae normal, sclerae anicteric  ENMT: External Ears, Nose appear normal. Grossly normal hearing. Mucous membranes are moist.  Neck: Appears normal, supple, no cervical masses, normal ROM, no appreciable thyromegaly, mild JVD Respiratory: Diminished to auscultation bilaterally, no wheezing, rales, rhonchi or crackles. Normal respiratory effort and patient is not tachypenic. No accessory muscle use. Wearing supplemental O2 via [place].  Cardiovascular: RRR, no murmurs / rubs / gallops. S1 and S2 auscultated. 1+ Lower Extremity edema with venous stasis changes Abdomen: Soft, non-tender, distended due to body habitus. No masses palpated. No appreciable hepatosplenomegaly. Bowel sounds positive x4.  GU: Deferred. Musculoskeletal: No clubbing / cyanosis of digits/nails. No joint deformity upper and lower extremities. Painful Left Knee on ROM testing Skin: No rashes,  lesions, ulcers on limited skin evaluation. No induration; Warm and dry.  Neurologic: CN 2-12 grossly intact with no focal deficits. Romberg sign cerebellar reflexes not assessed.  Psychiatric: Normal judgment and insight. Alert and oriented x 3. Normal mood and appropriate affect.   Labs on Admission: I have personally reviewed following labs and imaging studies  CBC:  Recent Labs Lab 08/20/16 0259 08/20/16 0947  WBC 10.9* 10.4  NEUTROABS 8.4*  --   HGB 10.3* 10.7*  HCT 31.6* 32.9*  MCV 82.1 82.5  PLT 189 644   Basic Metabolic Panel:  Recent Labs Lab 08/20/16 0259 08/20/16 0947  NA 141  --   K 3.4*  --   CL 103  --   CO2 28  --   GLUCOSE 74  --  BUN 38*  --   CREATININE 3.13* 3.03*  CALCIUM 7.8*  --    GFR: Estimated Creatinine Clearance: 33.5 mL/min (by C-G formula based on SCr of 3.03 mg/dL (H)). Liver Function Tests:  Recent Labs Lab 08/20/16 0259  AST 16  ALT 13*  ALKPHOS 59  BILITOT 0.5  PROT 6.2*  ALBUMIN 2.8*   No results for input(s): LIPASE, AMYLASE in the last 168 hours. No results for input(s): AMMONIA in the last 168 hours. Coagulation Profile: No results for input(s): INR, PROTIME in the last 168 hours. Cardiac Enzymes:  Recent Labs Lab 08/20/16 0259 08/20/16 0503 08/20/16 0947 08/20/16 1523  TROPONINI 0.15* 0.14* 0.12* 0.08*   BNP (last 3 results) No results for input(s): PROBNP in the last 8760 hours. HbA1C: No results for input(s): HGBA1C in the last 72 hours. CBG:  Recent Labs Lab 08/20/16 0217 08/20/16 0420  GLUCAP 75 82   Lipid Profile: No results for input(s): CHOL, HDL, LDLCALC, TRIG, CHOLHDL, LDLDIRECT in the last 72 hours. Thyroid Function Tests: No results for input(s): TSH, T4TOTAL, FREET4, T3FREE, THYROIDAB in the last 72 hours. Anemia Panel: No results for input(s): VITAMINB12, FOLATE, FERRITIN, TIBC, IRON, RETICCTPCT in the last 72 hours. Urine analysis:    Component Value Date/Time   COLORURINE YELLOW  08/20/2016 0559   APPEARANCEUR CLEAR 08/20/2016 0559   LABSPEC 1.010 08/20/2016 0559   PHURINE 6.0 08/20/2016 0559   GLUCOSEU NEGATIVE 08/20/2016 0559   HGBUR NEGATIVE 08/20/2016 0559   BILIRUBINUR NEGATIVE 08/20/2016 0559   KETONESUR NEGATIVE 08/20/2016 0559   PROTEINUR >300 (A) 08/20/2016 0559   NITRITE NEGATIVE 08/20/2016 0559   LEUKOCYTESUR NEGATIVE 08/20/2016 0559   Sepsis Labs: !!!!!!!!!!!!!!!!!!!!!!!!!!!!!!!!!!!!!!!!!!!! _0 (procalcitonin:4,lacticidven:4) )No results found for this or any previous visit (from the past 240 hour(s)).   Radiological Exams on Admission: Dg Chest 2 View  Result Date: 08/20/2016 CLINICAL DATA:  Shortness of breath. EXAM: CHEST  2 VIEW COMPARISON:  Radiograph 07/20/2016 FINDINGS: Cardiomegaly has progressed from prior exam. There is pulmonary edema and minimal fluid in the fissures. Streaky bibasilar opacities likely atelectasis. No confluent airspace disease. No pneumothorax. IMPRESSION: Increased cardiomegaly with pulmonary edema, consistent with CHF. Electronically Signed   By: Jeb Levering M.D.   On: 08/20/2016 03:15   Dg Knee Complete 4 Views Left  Result Date: 08/20/2016 CLINICAL DATA:  Left hip and knee pain. Awoke unable to walk. Swelling. EXAM: LEFT KNEE - COMPLETE 4+ VIEW COMPARISON:  None. FINDINGS: No acute fracture or subluxation. Bipartite patella with mild patellar spurring. No abnormal density, bony destructive change or periosteal reaction. Mild medial tibiofemoral joint space narrowing. Small knee joint effusion. Diffuse soft tissue edema. Vascular calcifications are seen. No soft tissue air. IMPRESSION: 1. Small knee joint effusion and diffuse soft tissue edema. 2. No acute osseous abnormality.  Mild osteoarthritis. Electronically Signed   By: Jeb Levering M.D.   On: 08/20/2016 03:11   Dg Hip Unilat With Pelvis 2-3 Views Left  Result Date: 08/20/2016 CLINICAL DATA:  Left hip pain.  Awoke today unable to walk. EXAM: DG HIP  (WITH OR WITHOUT PELVIS) 2-3V LEFT COMPARISON:  None. FINDINGS: The cortical margins of the bony pelvis are intact. No fracture. Pubic symphysis and sacroiliac joints are congruent. Both femoral heads are well-seated in the respective acetabula. No abnormal density or bony destructive change. Mild osteoarthritis of both hip joints. There are vascular calcifications. IMPRESSION: Mild osteoarthritis.  No acute bony abnormality. Electronically Signed   By: Jeb Levering M.D.   On:  08/20/2016 03:13   EKG: Independently reviewed. Showed Sinus Rhythm with Rate of 69 and no evidence of ST Elevation or Depression on my interpretation.   Assessment/Plan Principal Problem:   Heart failure (HCC) Active Problems:   HYPERCHOLESTEROLEMIA   Gout   Essential hypertension   Coronary artery disease involving native coronary artery of native heart without angina pectoris   Diabetes mellitus type 2 with peripheral artery disease (HCC)   Morbid obesity (HCC)   Obesity hypoventilation syndrome (HCC)   PAH (pulmonary artery hypertension)   Chronic systolic CHF (congestive heart failure) (HCC)   CKD (chronic kidney disease), stage IV (HCC)   Acute on chronic respiratory failure (HCC)  Acute on Chronic Hypoxic Respiratory Failure likely Multifactorial and from pHTN, CHF Exacerbation, and OHS/OSA -Admit to Medical Floor with Telemetry -Desaturated on Normal 4-5 Liters to 84% -CXR showed Increased Cardiomegaly with Pulmonary Edema, consistent with CHF -Continue Supplemental O2 via [place] -DuoNeb Breathing Tx 4 Times daily -Check Patient for the Flu and Viral Respiratory Panel as he had mild fever -ABG showed Hypoxemia  -Maintain O2 Saturations >92% -CPAP ordered for qHS and prn  -Appreciate Pulmonary Evaluation  Suspected Acute Decompensation of Chronic Systolic Heart failure with EF of 15-20% -ECHO and Right Sided Cath Done recently in January -Strict I's and O's, Daily Weights, SLIV, and Fluid Restrict -CXR  showed Increased Cardiomegaly with Pulmonary Edema, consistent with CHF -IV Diuresis with Lasix 40 mg BID (was given IV 60 mg in ED); Was on Demadex 40 mg Daily as an outpatient  -BNP was 251.0 -C/w Imdur 120 mg po Daily and Hydralazine 75 mg po TID -Appreciate Cardiology Consultation and Evaluation  Left Leg and Left Knee Pain, ? Gout Flare -Uric Acid Level was 12.2; Unable to give Allopurinol because of Kidney Fxn -X-Ray of Hip showed mild osteoarthritis and no acute bony abnormality, and X-Ray of knee showed small knee joint effusion and diffuse soft tissue edema with no acute osseous abnormality and mild osteoarthritis -Given IV Dexamethasone 4 mg -Get Ortho Evaluation for possible joint tap and PT Evaluation  Elevated Troponin -In the setting of CKD and Heart Failure -Troponin Trending down from 0.15 -> 0.08 -No Chest Pain  Hypokalemia -Patient's K+ was 3.4 -Replete Cautiously -Repeat CMP in AM  OHS/OSA -Ordered CPAP -Appreciate Pulmonary Evaluation  CAD with RCA Stenting -C/w ASA 81 mg po daily, Atorvastatin 20 mg Daily, Coreg 25 mg po BID  Diabetes Type 2 -C/w Lantus 90 units and with Novolog 50 units sq Daily -Continue to Monitor CBG's closely  CKD Stage IV -Patient's BUN/Cr was 38/3.13 and Repeat Cr was 3.03 -Continue to Monitor CMP and watch Cr and UOP as patient is being diuresed  Pulmonary HTN -Had recent RHC in January which showed PASP of 78 mmHg -Appreciate Cardiology and Pulmonary Evaluation  DVT prophylaxis: Lovenox 40 mg sq q24h Code Status: FULL CODE Family Communication: No Family present at bedside Disposition Plan: Pending PT Evaluation Consults called: Cardiology Dr. Domenic Polite, Pulmonary, Orthopedic Surgery Admission status: [place], D.O. [facility] Pager [phone]  If 7PM-7AM, please contact night-coverage [email or web address] Password [facility]  08/20/2016, 5:44 PM

## 2016-08-20 NOTE — ED Notes (Signed)
Pt wanting to sit on side of bed.  Has removed cardiac monitor. Call light in reach and urinal provided with instructions to call when needed to prevent falls.

## 2016-08-20 NOTE — Evaluation (Signed)
Physical Therapy Evaluation Patient Details Name: Gabriel Alvarez MRN: [id number] DOB: 28-Jan-1951 Today's Date: 08/20/2016   History of Present Illness  66 yo M admitted with Pain in his L thigh area that started on Feb 26. He states it has gotten progressively worse and tonight he is unable to walk on it. This prompted his call to EMS.  Pulse ox dropped into the 80s despite being on his usual oxygen at 4-5 L/m nasal cannula. He reportedly had a low blood sugar and ate candy PTA. EMS also reports fever to 100.7  Dx: Acute gouty flare in the knees, CHF.    Clinical Impression  Pt received in bed, and was agreeable to PT evaluation.  Pt expressed that he normally uses a rollator walker for ambulation and has been going to OPPT 2x's/wk.  He states he is independent with dressing and bathing.  During today's PT evaluation, he required Mod A for sit<>stand and Min A for SPT bed<>chair, however his SpO2 desaturated to 79% during transfer while on 7L HFNC.  He was able to improve back  >90% with cues for deep breathing.  He is recommended for HHPT at this time, however pt states that they do not come out to his house in [place].  His other option would be to continue with OPPT, however he would need a w/c for community mobility and getting to and from therapy appointments.      Follow Up Recommendations Home health PT;Supervision/Assistance - 24 hour;Outpatient PT (Pt states that [facility] does not come out to where he lives in [place].  )    Financial risk analyst (measurements PT);Wheelchair cushion (measurements PT)    Recommendations for Other Services       Precautions / Restrictions Precautions Precautions: Fall Precaution Comments: Pt states he has had probably ~192flls in the past 6 months.  Restrictions Weight Bearing Restrictions: No      Mobility  Bed Mobility Overal bed mobility: Needs Assistance Bed Mobility: Supine to Sit     Supine to sit: Min guard;HOB elevated      General bed mobility comments: increased time  Transfers Overall transfer level: Needs assistance Equipment used: Rolling walker (2 wheeled) Transfers: Sit to/from SBank of AmericaTransfers (NA due to SpO2 desaturation to 79% during transfer bed<>Chair. ) Sit to Stand: Mod assist Stand pivot transfers: Min guard          Ambulation/Gait                Stairs            Wheelchair Mobility    Modified Rankin (Stroke Patients Only)       Balance Overall balance assessment: History of Falls;Needs assistance Sitting-balance support: Bilateral upper extremity supported;Feet supported Sitting balance-Leahy Scale: Good     Standing balance support: Bilateral upper extremity supported Standing balance-Leahy Scale: Fair                               Pertinent Vitals/Pain Pain Assessment: 0-10 Pain Score: 6  Pain Location: B heels Pain Descriptors / Indicators: Burning Pain Intervention(s): Limited activity within patient's tolerance;Monitored during session;Repositioned    Home Living   Living Arrangements: Spouse/significant other (wife works 2nd shift) Available Help at Discharge: Available PRN/intermittently (nephew - works 1st shift. ) Type of Home: Mobile home Home Access: Stairs to enter   [employer] Steps: 3, and 1 sttep to go down into the  den Home Layout: One level Home Equipment: Environmental consultant - 4 wheels;Cane - single point;Other (comment) (O2, lift chair)      Prior Function     Gait / Transfers Assistance Needed: Pt ambulates with Rollator all the time.   ADL's / Homemaking Assistance Needed: independent with dressing and bathing.    Comments: Pt states he was going to OPPT     Hand Dominance   Dominant Hand: Left    Extremity/Trunk Assessment   Upper Extremity Assessment Upper Extremity Assessment: Overall WFL for tasks assessed    Lower Extremity Assessment Lower Extremity Assessment: Generalized  weakness       Communication   Communication: No difficulties  Cognition Arousal/Alertness: Awake/alert Behavior During Therapy: WFL for tasks assessed/performed Overall Cognitive Status: Within Functional Limits for tasks assessed                      General Comments      Exercises     Assessment/Plan    PT Assessment Patient needs continued PT services  PT Problem List Decreased strength;Decreased range of motion;Decreased activity tolerance;Decreased balance;Decreased mobility;Decreased safety awareness;Decreased knowledge of precautions;Cardiopulmonary status limiting activity;Obesity       PT Treatment Interventions DME instruction;Gait training;Functional mobility training;Therapeutic activities;Therapeutic exercise;Balance training;Patient/family education    PT Goals (Current goals can be found in the Care Plan section)  Acute Rehab PT Goals Patient Stated Goal: To go back home.  PT Goal Formulation: With patient Time For Goal Achievement: 08/27/16 Potential to Achieve Goals: Fair    Frequency Min 3X/week   Barriers to discharge Decreased caregiver support Pt lives with his wife who works 3rd shift, and his nephew comes over, but pt states that he just sleeps on the couch     Co-evaluation               End of Session Equipment Utilized During Treatment: Gait belt;Oxygen Activity Tolerance: Patient limited by fatigue Patient left: in chair;with call bell/phone within reach   PT Visit Diagnosis: Other abnormalities of gait and mobility (R26.89);Repeated falls (R29.6);Muscle weakness (generalized) (M62.81)    Functional Assessment Tool Used: AM-PAC 6 Clicks Basic Mobility;Clinical judgement Functional Limitation: Mobility: Walking and moving around Mobility: Walking and Moving Around Current Status (P1121): At least 40 percent but less than 60 percent impaired, limited or restricted Mobility: Walking and Moving Around Goal Status [phone]): At  least 20 percent but less than 40 percent impaired, limited or restricted    Time: 9507-2257 PT Time Calculation (min) (ACUTE ONLY): 34 min   Charges:   PT Evaluation $PT Eval Low Complexity: 1 Procedure PT Treatments $Therapeutic Activity: 8-22 mins   PT G Codes:   PT G-Codes **NOT FOR INPATIENT CLASS** Functional Assessment Tool Used: AM-PAC 6 Clicks Basic Mobility;Clinical judgement Functional Limitation: Mobility: Walking and moving around Mobility: Walking and Moving Around Current Status (D0518): At least 40 percent but less than 60 percent impaired, limited or restricted Mobility: Walking and Moving Around Goal Status [phone]): At least 20 percent but less than 40 percent impaired, limited or restricted     Beth Jordane Hisle, PT, DPT X: [phone]

## 2016-08-20 NOTE — Progress Notes (Signed)
**Note De-Identified [place] Bring Obfuscation** RT assessment: Patient sleeping at time of assessment; with a SAT of 84% on 8L HFNC, and periods of apnea.  Patient would benefit from CPAP during stay and out-patient sleep study.  RRT to continue to monitor.

## 2016-08-20 NOTE — Consult Note (Signed)
Requesting provider:  Dr. Kerney Elbe Primary cardiologist: Dr. Loralie Champagne (CHF clinic) Consulting cardiologist: Dr. Satira Sark  Reason for consultation: Leg swelling  Clinical Summary Mr. Misner is a medically complex 66 y.o.male with past medical history outlined below, currently admitted to the hospital complaining of leg pain and swelling. He was most recently seen in the CHF clinic on February 9 following hospitalization in January at which time right heart catheterization was obtained as detailed below. 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 He has chronic hypoxic respiratory failure with OSA/OHS which complicates his management including the presence of severe pulmonary hypertension. Weight at his last office encounter was 290 pounds, fairly similar range now. He reports compliance with his medications.  Initial lab work shows BNP 251, troponin I 0.15 and 0.14 and the absence of chest pain. Creatinine is up from 2.9-3.1, and his chest x-ray shows increased interstitial edema.   No Known Allergies  Medications Scheduled Medications: . aspirin EC  81 mg Oral Daily  . atorvastatin  20 mg Oral Daily  . carvedilol  25 mg Oral BID WC  . clopidogrel  75 mg Oral Daily  . enoxaparin (LOVENOX) injection  40 mg Subcutaneous Q24H  . furosemide  40 mg Intravenous Q12H  . hydrALAZINE  75 mg Oral TID  . insulin aspart  50 Units Subcutaneous Q supper  . insulin glargine  90 Units Subcutaneous BH-q7a  . ipratropium-albuterol  3 mL Inhalation QID  . isosorbide mononitrate  120 mg Oral Daily  . nicotine  21 mg Transdermal Daily  . sodium chloride flush  3 mL Intravenous Q12H  . traZODone  100 mg Oral QHS    PRN Medications: sodium chloride, acetaminophen, ondansetron (ZOFRAN) IV, sodium chloride flush   Past Medical History:  Diagnosis Date  . CAD (coronary artery  disease)    Remote stent to RCA, 50% in-stent restenosis by cardiac catheterization 2005  . Cardiomyopathy ([place])    LVEF 15-20%, likely nonischemic  . CKD (chronic kidney disease) stage 4, GFR 15-29 ml/min (HCC)   . ED (erectile dysfunction)   . Essential hypertension   . Gout   . Hyperlipidemia   . Hypoventilation syndrome    Chronic hypoxic respiratory failure  . OSA (obstructive sleep apnea)   . Pulmonary hypertension    Secondary to OSA/OHS, severe by [place] 06/2016  . Type 2 diabetes mellitus ([place])     Past Surgical History:  Procedure Laterality Date  . CARDIAC CATHETERIZATION N/A 06/26/2016   Procedure: Right Heart Cath;  Surgeon: Larey Dresser, MD;  Location: [facility];  Service: Cardiovascular;  Laterality: N/A;  . CARDIAC CATHETERIZATION N/A 06/30/2016   Procedure: Right Heart Cath;  Surgeon: Larey Dresser, MD;  Location: [facility];  Service: Cardiovascular;  Laterality: N/A;  . HAND SURGERY  02/2102    Family History  Problem Relation Age of Onset  . Diabetes Father     Social History Mr. Laneve reports that he has quit smoking. His smoking use included Cigarettes and Cigars. He started smoking about 1 months ago. He has a 30.00 pack-year smoking history. He has never used smokeless tobacco. Mr. Luckman reports that he does not drink alcohol.  Review of Systems Complete review of systems negative except as otherwise outlined in the clinical summary and also the following. Intermittent hypersomnolence and snoring.  Physical Examination Blood  pressure (!) 137/58, pulse 66, temperature 98.5 F (36.9 C), temperature source Oral, resp. rate 17, weight 296 lb 6.4 oz (134.4 kg), SpO2 95 %.  Intake/Output Summary (Last 24 hours) at 08/20/16 1023 Last data filed at 08/20/16 0932  Gross per 24 hour  Intake                0 ml  Output              350 ml  Net             -350 ml   Telemetry: Personally reviewed showing sinus rhythm.  Gen.: Morbidly obese  male, no distress. HEENT: Conjunctiva and lids normal, oropharynx clear with large protuberant tongue. Neck: Supple, elevated JVP evident, no carotid bruits, no thyromegaly. Lungs: Diminished breath sounds with scattered crackles at the bases, no wheezing, nonlabored breathing at rest. Cardiac: Indistinct PMI,  regular rate and rhythm, no S3, no pericardial rub. Abdomen: Protuberant, nontender, bowel sounds present. Extremities: Hyperpigmentation of the lower legs with 1+ edema, distal pulses 1-2+. Skin: Warm and dry. Musculoskeletal: No kyphosis. Neuropsychiatric: Alert and oriented x3, affect grossly appropriate.   Lab Results  Basic Metabolic Panel:  Recent Labs Lab 08/20/16 0259  NA 141  K 3.4*  CL 103  CO2 28  GLUCOSE 74  BUN 38*  CREATININE 3.13*  CALCIUM 7.8*    Liver Function Tests:  Recent Labs Lab 08/20/16 0259  AST 16  ALT 13*  ALKPHOS 59  BILITOT 0.5  PROT 6.2*  ALBUMIN 2.8*    CBC:  Recent Labs Lab 08/20/16 0259  WBC 10.9*  NEUTROABS 8.4*  HGB 10.3*  HCT 31.6*  MCV 82.1  PLT 189    Cardiac Enzymes:  Recent Labs Lab 08/20/16 0259 08/20/16 0503  TROPONINI 0.15* 0.14*    ECG Tracing from 08/20/2016 personally reviewed showing sinus rhythm with nonspecific ST changes.  Imaging Chest x-ray 08/20/2016: FINDINGS: Cardiomegaly has progressed from prior exam. There is pulmonary edema and minimal fluid in the fissures. Streaky bibasilar opacities likely atelectasis. No confluent airspace disease. No pneumothorax.  IMPRESSION: Increased cardiomegaly with pulmonary edema, consistent with CHF.  Right heart catheterization 06/30/2016: Right Heart Pressures [facility] Procedural Findings: Hemodynamics (mmHg) RA mean 3 RV 70/6 PA 78/21, mean 45 PCWP mean 7  Oxygen saturations: PA 72% RV 72% RA 69% AO 88%  Cardiac Output (Fick) 9.82  Cardiac Index (Fick) 3.84 PVR 3.9 WU    Impression  1. Acute on chronic combined heart failure with  component of volume overload complicated by severe pulmonary hypertension and chronic hypoxic respiratory failure. Patient reports compliance with Demadex 40 mg daily as an outpatient, was told to take an additional dose per CHF clinic, continues to complain of leg swelling. Has also had increased episodes of hypersomnolence, question whether worsening pulmonary status may also be contributing.  2. Probable nonischemic cardiomyopathy with LVEF 15-20% range.  3. Severe pulmonary hypertension, PASP 78 mmHg by right heart catheterization in January of this year. Suspected to be group 3 in the setting of OSA/OHS and unlikely to be responsive to pulmonary vasodilators.  4. OSA/OSH with chronic hypoxic respiratory failure. Patient prone to decompensations, has large protuberant tongue up struck his. It sounds like a trach and even been discussed during his hospital stay in January.  5. CKD, stage 4. Current creatinine 3.1.  6. History of CAD and remote RCA stenting. No obvious angina symptoms. Increased troponin I unlikely to represent ACS, suspect demand ischemia due to  heart failure.   Recommendations  Discussed with Dr. Alfredia Ferguson on hospitalist team. Agree with transition to Lasix 40 mg IV daily for now, follow urine output and renal function. Otherwise continue his remaining cardiac regimen, replete electrolytes as needed. Also concerned that decompensation with worsening hypoxic respiratory failure may also be contributing, he may need BiPAP and Pulmonary consultation for further recommendations.  Satira Sark, M.D., F.A.C.C.

## 2016-08-20 NOTE — ED Notes (Signed)
CRITICAL VALUE ALERT  Critical value received: trop 0.15  Date of notification:  08/20/16  Time of notification:  0349  Critical value read back: yes  Nurse who received alert:  Kendell Bane RN  MD notified (1st page):  Tomi Bamberger  Time of first page:  0351  MD notified (2nd page):  Time of second page:  Responding MD:  knapp  Time MD responded:  [phone]

## 2016-08-21 ENCOUNTER — Inpatient Hospital Stay (HOSPITAL_COMMUNITY): Payer: Managed Care, Other (non HMO)

## 2016-08-21 DIAGNOSIS — M5136 Other intervertebral disc degeneration, lumbar region: Secondary | ICD-10-CM

## 2016-08-21 DIAGNOSIS — M79606 Pain in leg, unspecified: Secondary | ICD-10-CM

## 2016-08-21 DIAGNOSIS — D72829 Elevated white blood cell count, unspecified: Secondary | ICD-10-CM

## 2016-08-21 DIAGNOSIS — I5023 Acute on chronic systolic (congestive) heart failure: Secondary | ICD-10-CM

## 2016-08-21 DIAGNOSIS — M1712 Unilateral primary osteoarthritis, left knee: Secondary | ICD-10-CM

## 2016-08-21 DIAGNOSIS — M79605 Pain in left leg: Secondary | ICD-10-CM

## 2016-08-21 LAB — CBC WITH DIFFERENTIAL/PLATELET
Basophils Absolute: 0 10*3/uL (ref 0.0–0.1)
Basophils Relative: 0 %
EOS PCT: 0 %
Eosinophils Absolute: 0 10*3/uL (ref 0.0–0.7)
HCT: 30.7 % — ABNORMAL LOW (ref 39.0–52.0)
Hemoglobin: 10.5 g/dL — ABNORMAL LOW (ref 13.0–17.0)
LYMPHS ABS: 1.1 10*3/uL (ref 0.7–4.0)
LYMPHS PCT: 9 %
MCH: 27.6 pg (ref 26.0–34.0)
MCHC: 34.2 g/dL (ref 30.0–36.0)
MCV: 80.6 fL (ref 78.0–100.0)
Monocytes Absolute: 1 10*3/uL (ref 0.1–1.0)
Monocytes Relative: 8 %
NEUTROS ABS: 10.2 10*3/uL — AB (ref 1.7–7.7)
NEUTROS PCT: 83 %
PLATELETS: 199 10*3/uL (ref 150–400)
RBC: 3.81 MIL/uL — ABNORMAL LOW (ref 4.22–5.81)
RDW: 15.3 % (ref 11.5–15.5)
WBC: 12.3 10*3/uL — AB (ref 4.0–10.5)

## 2016-08-21 LAB — COMPREHENSIVE METABOLIC PANEL
ALK PHOS: 53 U/L (ref 38–126)
ALT: 13 U/L — AB (ref 17–63)
AST: 15 U/L (ref 15–41)
Albumin: 2.5 g/dL — ABNORMAL LOW (ref 3.5–5.0)
Anion gap: 9 (ref 5–15)
BUN: 48 mg/dL — AB (ref 6–20)
CALCIUM: 7.6 mg/dL — AB (ref 8.9–10.3)
CHLORIDE: 99 mmol/L — AB (ref 101–111)
CO2: 27 mmol/L (ref 22–32)
CREATININE: 2.94 mg/dL — AB (ref 0.61–1.24)
GFR calc Af Amer: 24 mL/min — ABNORMAL LOW (ref >60)
GFR, EST NON AFRICAN AMERICAN: 21 mL/min — AB (ref >60)
Glucose, Bld: 268 mg/dL — ABNORMAL HIGH (ref 65–99)
Potassium: 4.1 mmol/L (ref 3.5–5.1)
Sodium: 135 mmol/L (ref 135–145)
Total Bilirubin: 0.6 mg/dL (ref 0.3–1.2)
Total Protein: 6 g/dL — ABNORMAL LOW (ref 6.5–8.1)

## 2016-08-21 LAB — GLUCOSE, CAPILLARY
Glucose-Capillary: 410 mg/dL — ABNORMAL HIGH (ref 65–99)
Glucose-Capillary: 303 mg/dL — ABNORMAL HIGH (ref 65–99)

## 2016-08-21 LAB — PHOSPHORUS: Phosphorus: 3.8 mg/dL (ref 2.5–4.6)

## 2016-08-21 LAB — MAGNESIUM: MAGNESIUM: 1.7 mg/dL (ref 1.7–2.4)

## 2016-08-21 MED ORDER — POLYETHYLENE GLYCOL 3350 17 G PO PACK
17.0000 g | PACK | Freq: Every day | ORAL | Status: DC
  Administered 2016-08-21: 17 g via ORAL
  Filled 2016-08-21 (×2): qty 1

## 2016-08-21 MED ORDER — INSULIN ASPART 100 UNIT/ML ~~LOC~~ SOLN
0.0000 [IU] | Freq: Every day | SUBCUTANEOUS | Status: DC
  Administered 2016-08-21: 4 [IU] via SUBCUTANEOUS

## 2016-08-21 MED ORDER — SENNOSIDES-DOCUSATE SODIUM 8.6-50 MG PO TABS
1.0000 | ORAL_TABLET | Freq: Two times a day (BID) | ORAL | Status: DC
  Administered 2016-08-21 – 2016-08-22 (×3): 1 via ORAL
  Filled 2016-08-21 (×3): qty 1

## 2016-08-21 MED ORDER — INSULIN ASPART 100 UNIT/ML ~~LOC~~ SOLN
0.0000 [IU] | Freq: Three times a day (TID) | SUBCUTANEOUS | Status: DC
  Administered 2016-08-21: 15 [IU] via SUBCUTANEOUS

## 2016-08-21 MED ORDER — SALINE SPRAY 0.65 % NA SOLN
1.0000 | NASAL | Status: DC | PRN
  Administered 2016-08-21: 1 via NASAL
  Filled 2016-08-21: qty 44

## 2016-08-21 MED ORDER — IPRATROPIUM-ALBUTEROL 0.5-2.5 (3) MG/3ML IN SOLN
3.0000 mL | Freq: Three times a day (TID) | RESPIRATORY_TRACT | Status: DC
  Administered 2016-08-21 – 2016-08-22 (×2): 3 mL via RESPIRATORY_TRACT
  Filled 2016-08-21 (×3): qty 3

## 2016-08-21 MED ORDER — TRAMADOL HCL 50 MG PO TABS
50.0000 mg | ORAL_TABLET | Freq: Four times a day (QID) | ORAL | Status: DC | PRN
  Administered 2016-08-21: 50 mg via ORAL
  Filled 2016-08-21: qty 1

## 2016-08-21 NOTE — Progress Notes (Addendum)
PROGRESS NOTE    Gabriel Alvarez  NWG:[id number] DOB: May 09, 1951 DOA: 08/20/2016 PCP: Manon Hilding, MD   Brief Narrative:  Gabriel Alvarez is a 66 y.o. male with medical history significant of HTN, Gout, HLD, Chronic Systolic CHF with EF of 57-84%, CAD, OHS/OSA, pHTN, Insulin Dependent Diabetes and other comorbidities who presented to [facility] with Leg Pain and inability to get out of bed. Patient states he developed severe knee pain and was unable to ambulate. Denied SOB but O2 Saturations were lower on baseline O2. Also complained of some increase in leg swelling. No nausea, vomiting, or Cp. States symptoms started yesterday and got progressively worse. Per patient had a slight temperature overnight and wife had to give him some snackes because CBG was low. Denied any other complaints or concerns and TRH was asked to admit for Acute on Chronic Respiratory Failure, Suspected CHF Exacerbation, and Severe Left Knee Pain. Patients Knee pain is improved and diuresing well. Was evaluated by Pulmonary, Cardiology, and Orthopedic Surgery. Likely D/C in AM if Stable.   Assessment & Plan:   Principal Problem:   Acute on chronic congestive heart failure (HCC) Active Problems:   HYPERCHOLESTEROLEMIA   Gout   Essential hypertension   Coronary artery disease involving native coronary artery of native heart without angina pectoris   Diabetes mellitus type 2 with peripheral artery disease (HCC)   Morbid obesity (HCC)   Obesity hypoventilation syndrome (HCC)   PAH (pulmonary artery hypertension)   Chronic systolic CHF (congestive heart failure) (HCC)   CKD (chronic kidney disease), stage IV (HCC)   Acute on chronic respiratory failure (HCC)   Fever   Hypoxia   Pulmonary hypertension  Acute on Chronic Hypoxic Respiratory Failure likely Multifactorial and from pHTN, CHF Exacerbation, and OHS/OSA -Admitted to Medical Floor with Telemetry -Desaturated on Normal 4-5 Liters to 84% -CXR showed Increased  Cardiomegaly with Pulmonary Edema, consistent with CHF -Continue Supplemental O2 via [place] -DuoNeb Breathing Tx 4 Times daily -Check Patient for the Flu and Viral Respiratory Panel as he had mild fever -ABG showed Hypoxemia  -Maintain O2 Saturations >92% -CPAP ordered for qHS and prn  -Appreciated Pulmonary Evaluation; Patient not on CPAP and recommends outpatient evaluation  -Improved  Suspected Acute Decompensation of Chronic Systolic Heart failure with EF of 15-20% -ECHO and Right Sided Cath Done recently in January -Strict I's and O's, Daily Weights, SLIV, and Fluid Restrict -CXR showed Increased Cardiomegaly with Pulmonary Edema, consistent with CHF -C/w IV Diuresis with Lasix 40 mg BID today (was given IV 60 mg in ED); Was on Demadex 40 mg Daily as an outpatient and will resume at D/C -Patient Diuresed 1.5 Liters so far. -BNP was 251.0 -C/w Imdur 120 mg po Daily,Hydralazine 75 mg po TID, and with Carvedilol 25 mg po BID; No ACE/ARB because Renal Insufficiency  -Appreciate Cardiology Consultation and Evaluation; Cardiology discussed with patient about Fluid Restriction and recommended doubling Demadex dose as an outpatient if patient gains 2-3 pounds in 24 hours or 5 pounds in a week.  -Heart Failure Clinic Appointment scheduled later for this month;    Left Leg and Left Knee Pain, ? Gout Flare -Uric Acid Level was 12.2; Unable to give Allopurinol because of Kidney Fxn -X-Ray of Hip showed mild osteoarthritis and no acute bony abnormality, and X-Ray of knee showed small knee joint effusion and diffuse soft tissue edema with no acute osseous abnormality and mild osteoarthritis -Given IV Dexamethasone 4 mg -Orthopedics Dr. Aline Brochure consulted and appreciated Recc's -  X-Rays reviewed by Dr. Aline Brochure and he agrees that the patient has Arthritis in the Left Knee and minimal Arthritis in the Left Hip; He recommended X-Rays of his L Spine as he felt the pain was more radicular type pain rather  than knee effusion related -X-Rays of L spine showed chronic degenerative changes and was most likely the cause of his Acute Left Radicular Pain -Dr. Aline Brochure recommended Management with Oral Anti-Inflammatories or oral steroids however unable to use them because of Heart Failure and Kidney Disease; Reccommended Tylenol along with Heating Pad and Muscle Relaxers -If patient continues to have trouble Dr. Aline Brochure recommended that he see the surgeon who operated on the Pateint's spine -PT Recommended Home Health PT vs Outpatient PT  Elevated Troponin -In the setting of CKD and Heart Failure -Troponin Trending down from 0.15 -> 0.08 -> 0.06 -No Chest Pain  Hypokalemia, improved -Patient's K+ was 3.4 yesterday and improved to 4.1 -Repeat CMP in AM  OHS/OSA -Ordered CPAP -Appreciated Pulmonary Evaluation; Recommended no change in current treatments -Patient will need outpatient OSA evaluation if agreeable  CAD with RCA Stenting -C/w ASA 81 mg po daily, Atorvastatin 20 mg Daily, Coreg 25 mg po BID  Diabetes Type 2 -C/w Lantus 90 units and with Novolog 50 units sq Daily -Diabetic Education Coordinator Reccs appreciated -Recommended Starting Moderate Novolog Correction SSI AC HS -Continue to Monitor CBG's closely; CBG was 82 and AM Glucose was 268  CKD Stage IV -Patient's BUN/Cr was 38/3.13 on admission and went to 48/2.94 -Continue to Monitor CMP and watch Cr and UOP as patient is being diuresed  Pulmonary HTN -Had recent [place] in January which showed PASP of 78 mmHg -Appreciate Cardiology and Pulmonary Evaluation -Per Cardiology Patient is unlikely going to be responsive to pulmonary vasodilators.  Leukocytosis -Mild and Unclear Etiology as went from 10.4 -> 12.3; Possibly from IV Decadron given yesterday -No S/Sx of Infection; Likely reactive -Continue to Monitor and Repeat CBC in AM  DVT prophylaxis: Lovenox 40 mg sq Code Status: FULL CODE Family Communication: Discussed  with wife at bedside Disposition Plan: [facility] vs Outpatient PT with likley D/C in Am; Will benefit from [facility], [place], and [facility] per Care Management  Consultants:   Cardiology  Pulmonology  Orthopedic Surgery   Procedures: None   Antimicrobials:  Anti-infectives    None     Subjective: Seen and examined at bedside and was improved. No Nausea or Vomiting. States he is not SOB and that swelling is decreasing. No other complaints or concerns and states leg/knee is improved.   Objective: Vitals:   08/20/16 1840 08/20/16 1959 08/20/16 2126 08/21/16 0625  BP:   (!) 148/67 138/79  Pulse:  76 74 72  Resp:  _0 Temp:   97.8 F (36.6 C) 98 F (36.7 C)  TempSrc:   Oral Oral  SpO2: 95% 95% 94% 94%  Weight:    134.4 kg (296 lb 5.5 oz)  Height:        Intake/Output Summary (Last 24 hours) at 08/21/16 1138 Last data filed at 08/21/16 0500  Gross per 24 hour  Intake                0 ml  Output             1600 ml  Net            -1600 ml   Filed Weights   08/20/16 3817 08/20/16 0825 08/21/16 7116  Weight: 131.5 kg (290 lb) 134.4 kg (296 lb 6.4 oz) 134.4 kg (296 lb 5.5 oz)   Examination: Physical Exam:  Constitutional: obese AAM who is NAD and appears calm Eyes: Lids and conjunctivae normal, sclerae anicteric  ENMT: External Ears, Nose appear normal. Grossly normal hearing. Mucous membranes are moist.  Neck: Appears normal, supple, no cervical masses, normal ROM, no appreciable thyromegaly, mild JVD Respiratory: Diminished to auscultation bilaterally, no wheezing, rales, rhonchi or crackles. Normal respiratory effort and patient is not tachypenic. No accessory muscle use. Wearing supplemental O2 via [place].  Cardiovascular: RRR, no murmurs / rubs / gallops. S1 and S2 auscultated. 1+ Lower Extremity edema with venous stasis changes Abdomen: Soft, non-tender, distended due to body habitus. No masses palpated. No appreciable hepatosplenomegaly. Bowel sounds positive  x4.  GU: Deferred. Musculoskeletal: No clubbing / cyanosis of digits/nails. No joint deformity upper and lower extremities. Painful Left Knee on ROM testing Skin: No rashes, lesions, ulcers on limited skin evaluation. No induration; Warm and dry.  Neurologic: CN 2-12 grossly intact with no focal deficits. Romberg sign cerebellar reflexes not assessed.  Psychiatric: Normal judgment and insight. Alert and oriented x 3. Normal and pleasant mood and appropriate affect.   Data Reviewed: I have personally reviewed following labs and imaging studies  CBC:  Recent Labs Lab 08/20/16 0259 08/20/16 0947 08/21/16 0518  WBC 10.9* 10.4 12.3*  NEUTROABS 8.4*  --  10.2*  HGB 10.3* 10.7* 10.5*  HCT 31.6* 32.9* 30.7*  MCV 82.1 82.5 80.6  PLT 189 191 098   Basic Metabolic Panel:  Recent Labs Lab 08/20/16 0259 08/20/16 0947 08/21/16 0518  NA 141  --  135  K 3.4*  --  4.1  CL 103  --  99*  CO2 28  --  27  GLUCOSE 74  --  268*  BUN 38*  --  48*  CREATININE 3.13* 3.03* 2.94*  CALCIUM 7.8*  --  7.6*  MG  --   --  1.7  PHOS  --   --  3.8   GFR: Estimated Creatinine Clearance: 34.6 mL/min (by C-G formula based on SCr of 2.94 mg/dL (H)). Liver Function Tests:  Recent Labs Lab 08/20/16 0259 08/21/16 0518  AST 16 15  ALT 13* 13*  ALKPHOS 59 53  BILITOT 0.5 0.6  PROT 6.2* 6.0*  ALBUMIN 2.8* 2.5*   No results for input(s): LIPASE, AMYLASE in the last 168 hours. No results for input(s): AMMONIA in the last 168 hours. Coagulation Profile: No results for input(s): INR, PROTIME in the last 168 hours. Cardiac Enzymes:  Recent Labs Lab 08/20/16 0259 08/20/16 0503 08/20/16 0947 08/20/16 1523 08/20/16 2114  TROPONINI 0.15* 0.14* 0.12* 0.08* 0.06*   BNP (last 3 results) No results for input(s): PROBNP in the last 8760 hours. HbA1C: No results for input(s): HGBA1C in the last 72 hours. CBG:  Recent Labs Lab 08/20/16 0217 08/20/16 0420  GLUCAP 75 82   Lipid Profile: No  results for input(s): CHOL, HDL, LDLCALC, TRIG, CHOLHDL, LDLDIRECT in the last 72 hours. Thyroid Function Tests: No results for input(s): TSH, T4TOTAL, FREET4, T3FREE, THYROIDAB in the last 72 hours. Anemia Panel: No results for input(s): VITAMINB12, FOLATE, FERRITIN, TIBC, IRON, RETICCTPCT in the last 72 hours. Sepsis Labs: No results for input(s): PROCALCITON, LATICACIDVEN in the last 168 hours.  No results found for this or any previous visit (from the past 240 hour(s)).   Radiology Studies: Dg Chest 2 View  Result Date: 08/20/2016 CLINICAL DATA:  Shortness of breath. EXAM: CHEST  2 VIEW COMPARISON:  Radiograph 07/20/2016 FINDINGS: Cardiomegaly has progressed from prior exam. There is pulmonary edema and minimal fluid in the fissures. Streaky bibasilar opacities likely atelectasis. No confluent airspace disease. No pneumothorax. IMPRESSION: Increased cardiomegaly with pulmonary edema, consistent with CHF. Electronically Signed   By: Jeb Levering M.D.   On: 08/20/2016 03:15   Dg Lumbar Spine 2-3 Views  Result Date: 08/21/2016 CLINICAL DATA:  Low back and left leg pain, history of prior lumbar surgery EXAM: LUMBAR SPINE - 2-3 VIEW COMPARISON:  None FINDINGS: Osseous demineralization. Five non-rib-bearing lumbar vertebra. Disc space narrowing L4-L5. Facet degenerative changes lower lumbar spine at L4-L5 and L5-S1. Vertebral body heights maintained without fracture or subluxation. No bone destruction or spondylolysis. Atherosclerotic calcifications aorta and iliac arteries. LEFT upper quadrant calcifications correspond to adrenal calcifications on a prior chest CT. SI joints preserved. IMPRESSION: Osseous mineralization with degenerative disc and facet disease changes of lower lumbar spine. No acute bony abnormalities. LEFT adrenal calcifications which likely reflect prior hemorrhage or infection. Electronically Signed   By: Lavonia Dana M.D.   On: 08/21/2016 09:15   Dg Knee Complete 4 Views  Left  Result Date: 08/20/2016 CLINICAL DATA:  Left hip and knee pain. Awoke unable to walk. Swelling. EXAM: LEFT KNEE - COMPLETE 4+ VIEW COMPARISON:  None. FINDINGS: No acute fracture or subluxation. Bipartite patella with mild patellar spurring. No abnormal density, bony destructive change or periosteal reaction. Mild medial tibiofemoral joint space narrowing. Small knee joint effusion. Diffuse soft tissue edema. Vascular calcifications are seen. No soft tissue air. IMPRESSION: 1. Small knee joint effusion and diffuse soft tissue edema. 2. No acute osseous abnormality.  Mild osteoarthritis. Electronically Signed   By: Jeb Levering M.D.   On: 08/20/2016 03:11   Dg Hip Unilat With Pelvis 2-3 Views Left  Result Date: 08/20/2016 CLINICAL DATA:  Left hip pain.  Awoke today unable to walk. EXAM: DG HIP (WITH OR WITHOUT PELVIS) 2-3V LEFT COMPARISON:  None. FINDINGS: The cortical margins of the bony pelvis are intact. No fracture. Pubic symphysis and sacroiliac joints are congruent. Both femoral heads are well-seated in the respective acetabula. No abnormal density or bony destructive change. Mild osteoarthritis of both hip joints. There are vascular calcifications. IMPRESSION: Mild osteoarthritis.  No acute bony abnormality. Electronically Signed   By: Jeb Levering M.D.   On: 08/20/2016 03:13   Scheduled Meds: . aspirin EC  81 mg Oral Daily  . atorvastatin  20 mg Oral Daily  . carvedilol  25 mg Oral BID WC  . clopidogrel  75 mg Oral Daily  . enoxaparin (LOVENOX) injection  40 mg Subcutaneous Q24H  . furosemide  40 mg Intravenous Q12H  . hydrALAZINE  75 mg Oral TID  . insulin aspart  50 Units Subcutaneous Q supper  . insulin glargine  90 Units Subcutaneous BH-q7a  . ipratropium-albuterol  3 mL Inhalation QID  . isosorbide mononitrate  120 mg Oral Daily  . nicotine  21 mg Transdermal Daily  . polyethylene glycol  17 g Oral Daily  . senna-docusate  1 tablet Oral BID  . sodium chloride flush  3 mL  Intravenous Q12H  . traZODone  100 mg Oral QHS   Continuous Infusions:   LOS: 1 day   Kerney Elbe, DO [facility] Pager [phone]  If 7PM-7AM, please contact night-coverage [email or web address] Password [facility] 08/21/2016, 11:38 AM

## 2016-08-21 NOTE — Consult Note (Addendum)
Reason for Consult: Left knee effusion Referring Physician: Dr. Rich Number is an 66 y.o. male.  HPI: 66 year old male with history of multiple back surgeries woke up one morning could not ambulate complained of knee pain in the posterior aspect of his knee radiating from his thigh down to the plantar aspect of his left foot associated with some weakness which he perceived his knee pain primarily  He was brought in the hospital for evaluation and treatment  On evaluation today he says he can move his leg much better he can bend his knee although he still having posterior knee pain and posterior leg pain with plantar foot associated numbness.  Past Medical History:  Diagnosis Date  . CAD (coronary artery disease)    Remote stent to RCA, 50% in-stent restenosis by cardiac catheterization 2005  . Cardiomyopathy ([place])    LVEF 15-20%, likely nonischemic  . CKD (chronic kidney disease) stage 4, GFR 15-29 ml/min (HCC)   . ED (erectile dysfunction)   . Essential hypertension   . Gout   . Hyperlipidemia   . Hypoventilation syndrome    Chronic hypoxic respiratory failure  . OSA (obstructive sleep apnea)   . Pulmonary hypertension    Secondary to OSA/OHS, severe by [place] 06/2016  . Type 2 diabetes mellitus ([place])     Past Surgical History:  Procedure Laterality Date  . CARDIAC CATHETERIZATION N/A 06/26/2016   Procedure: Right Heart Cath;  Surgeon: Larey Dresser, MD;  Location: [facility];  Service: Cardiovascular;  Laterality: N/A;  . CARDIAC CATHETERIZATION N/A 06/30/2016   Procedure: Right Heart Cath;  Surgeon: Larey Dresser, MD;  Location: [facility];  Service: Cardiovascular;  Laterality: N/A;  . HAND SURGERY  02/2102    Family History  Problem Relation Age of Onset  . Diabetes Father     Social History:  reports that he has quit smoking. His smoking use included Cigarettes and Cigars. He started smoking about 2 months ago. He has a 30.00 pack-year smoking  history. He has never used smokeless tobacco. He reports that he does not drink alcohol or use drugs.  Allergies: No Known Allergies   Current Facility-Administered Medications:  .  0.9 %  sodium chloride infusion, 250 mL, Intravenous, PRN, Bertram Savin Sheikh, DO .  acetaminophen (TYLENOL) tablet 650 mg, 650 mg, Oral, Q4H PRN, Bertram Savin Sheikh, DO, 650 mg at 08/20/16 2110 .  aspirin EC tablet 81 mg, 81 mg, Oral, Daily, [employer], DO, 81 mg at 08/21/16 1023 .  atorvastatin (LIPITOR) tablet 20 mg, 20 mg, Oral, Daily, Omair Latif Sheikh, DO, 20 mg at 08/21/16 1022 .  carvedilol (COREG) tablet 25 mg, 25 mg, Oral, BID WC, Omair Latif Sheikh, DO, 25 mg at 08/21/16 2440 .  clopidogrel (PLAVIX) tablet 75 mg, 75 mg, Oral, Daily, [employer], DO, 75 mg at 08/21/16 1023 .  enoxaparin (LOVENOX) injection 40 mg, 40 mg, Subcutaneous, Q24H, Omair Latif Sheikh, DO, 40 mg at 08/21/16 1022 .  furosemide (LASIX) injection 40 mg, 40 mg, Intravenous, Q12H, Omair Latif Sheikh, DO, 40 mg at 08/21/16 1022 .  hydrALAZINE (APRESOLINE) tablet 75 mg, 75 mg, Oral, TID, Omair Latif Sheikh, DO, 75 mg at 08/21/16 1022 .  insulin aspart (novoLOG) injection 50 Units, 50 Units, Subcutaneous, Q supper, [facility], DO, 50 Units at 08/20/16 1812 .  insulin glargine (LANTUS) injection 90 Units, 90 Units, Subcutaneous, Hazle Nordmann, DO, 90 Units at 08/21/16  58 .  ipratropium-albuterol (DUONEB) 0.5-2.5 (3) MG/3ML nebulizer solution 3 mL, 3 mL, Inhalation, QID, Omair Latif Sheikh, DO, 3 mL at 08/20/16 1956 .  isosorbide mononitrate (IMDUR) 24 hr tablet 120 mg, 120 mg, Oral, Daily, [employer], DO, 120 mg at 08/21/16 1023 .  nicotine (NICODERM CQ - dosed in mg/24 hours) patch 21 mg, 21 mg, Transdermal, Daily, [employer], DO, 21 mg at 08/20/16 1038 .  ondansetron (ZOFRAN) injection 4 mg, 4 mg, Intravenous, Q6H PRN, Omair Latif Sheikh, DO .  polyethylene glycol (MIRALAX / GLYCOLAX)  packet 17 g, 17 g, Oral, Daily, [employer], DO, 17 g at 08/21/16 1023 .  senna-docusate (Senokot-S) tablet 1 tablet, 1 tablet, Oral, BID, Omair Latif Sheikh, DO, 1 tablet at 08/21/16 1022 .  sodium chloride flush (NS) 0.9 % injection 3 mL, 3 mL, Intravenous, Q12H, Omair Latif Sheikh, DO, 3 mL at 08/21/16 1024 .  sodium chloride flush (NS) 0.9 % injection 3 mL, 3 mL, Intravenous, PRN, Bertram Savin Sheikh, DO .  traMADol (ULTRAM) tablet 50 mg, 50 mg, Oral, Q6H PRN, Rise Patience, MD, 50 mg at 08/21/16 0033 .  traZODone (DESYREL) tablet 100 mg, 100 mg, Oral, QHS, Omair Latif Sheikh, DO, 100 mg at 08/20/16 2111   Results for orders placed or performed during the hospital encounter of 08/20/16 (from the past 48 hour(s))  CBG monitoring, ED     Status: None   Collection Time: 08/20/16  2:17 AM  Result Value Ref Range   Glucose-Capillary 75 65 - 99 mg/dL  Brain natriuretic peptide     Status: Abnormal   Collection Time: 08/20/16  2:59 AM  Result Value Ref Range   B Natriuretic Peptide 251.0 (H) 0.0 - 100.0 pg/mL  Comprehensive metabolic panel     Status: Abnormal   Collection Time: 08/20/16  2:59 AM  Result Value Ref Range   Sodium 141 135 - 145 mmol/L   Potassium 3.4 (L) 3.5 - 5.1 mmol/L   Chloride 103 101 - 111 mmol/L   CO2 28 22 - 32 mmol/L   Glucose, Bld 74 65 - 99 mg/dL   BUN 38 (H) 6 - 20 mg/dL   Creatinine, Ser 3.13 (H) 0.61 - 1.24 mg/dL   Calcium 7.8 (L) 8.9 - 10.3 mg/dL   Total Protein 6.2 (L) 6.5 - 8.1 g/dL   Albumin 2.8 (L) 3.5 - 5.0 g/dL   AST 16 15 - 41 U/L   ALT 13 (L) 17 - 63 U/L   Alkaline Phosphatase 59 38 - 126 U/L   Total Bilirubin 0.5 0.3 - 1.2 mg/dL   GFR calc non Af Amer 19 (L) >60 mL/min   GFR calc Af Amer 22 (L) >60 mL/min    Comment: (NOTE) The eGFR has been calculated using the CKD EPI equation. This calculation has not been validated in all clinical situations. eGFR's persistently <60 mL/min signify possible Chronic Kidney Disease.    Anion  gap 10 5 - 15  CBC with Differential     Status: Abnormal   Collection Time: 08/20/16  2:59 AM  Result Value Ref Range   WBC 10.9 (H) 4.0 - 10.5 K/uL   RBC 3.85 (L) 4.22 - 5.81 MIL/uL   Hemoglobin 10.3 (L) 13.0 - 17.0 g/dL   HCT 31.6 (L) 39.0 - 52.0 %   MCV 82.1 78.0 - 100.0 fL   MCH 26.8 26.0 - 34.0 pg   MCHC 32.6 30.0 - 36.0 g/dL   RDW  15.6 (H) 11.5 - 15.5 %   Platelets 189 150 - 400 K/uL   Neutrophils Relative % 77 %   Neutro Abs 8.4 (H) 1.7 - 7.7 K/uL   Lymphocytes Relative 13 %   Lymphs Abs 1.4 0.7 - 4.0 K/uL   Monocytes Relative 8 %   Monocytes Absolute 0.9 0.1 - 1.0 K/uL   Eosinophils Relative 2 %   Eosinophils Absolute 0.2 0.0 - 0.7 K/uL   Basophils Relative 0 %   Basophils Absolute 0.0 0.0 - 0.1 K/uL  Troponin I     Status: Abnormal   Collection Time: 08/20/16  2:59 AM  Result Value Ref Range   Troponin I 0.15 (HH) <0.03 ng/mL    Comment: CRITICAL RESULT CALLED TO, READ BACK BY AND VERIFIED WITH:  [place],C @ 3536 ON 08/20/16 BY JUW   Uric acid     Status: Abnormal   Collection Time: 08/20/16  2:59 AM  Result Value Ref Range   Uric Acid, Serum 12.2 (H) 4.4 - 7.6 mg/dL  CBG monitoring, ED     Status: None   Collection Time: 08/20/16  4:20 AM  Result Value Ref Range   Glucose-Capillary 82 65 - 99 mg/dL  Troponin I     Status: Abnormal   Collection Time: 08/20/16  5:03 AM  Result Value Ref Range   Troponin I 0.14 (HH) <0.03 ng/mL    Comment: CRITICAL VALUE NOTED.  VALUE IS CONSISTENT WITH PREVIOUSLY REPORTED AND CALLED VALUE.  Urinalysis, Routine w reflex microscopic     Status: Abnormal   Collection Time: 08/20/16  5:59 AM  Result Value Ref Range   Color, Urine YELLOW YELLOW   APPearance CLEAR CLEAR   Specific Gravity, Urine 1.010 1.005 - 1.030   pH 6.0 5.0 - 8.0   Glucose, UA NEGATIVE NEGATIVE mg/dL   Hgb urine dipstick NEGATIVE NEGATIVE   Bilirubin Urine NEGATIVE NEGATIVE   Ketones, ur NEGATIVE NEGATIVE mg/dL   Protein, ur >300 (A) NEGATIVE mg/dL    Nitrite NEGATIVE NEGATIVE   Leukocytes, UA NEGATIVE NEGATIVE  Urinalysis, Microscopic (reflex)     Status: None   Collection Time: 08/20/16  5:59 AM  Result Value Ref Range   RBC / HPF 0-5 0 - 5 RBC/hpf   WBC, UA NONE SEEN 0 - 5 WBC/hpf   Bacteria, UA NONE SEEN NONE SEEN   Squamous Epithelial / LPF NONE SEEN NONE SEEN   Urine-Other HYALINE CASTS   Blood gas, arterial     Status: Abnormal   Collection Time: 08/20/16  6:20 AM  Result Value Ref Range   O2 Content 6.0 L/min   Delivery systems NASAL CANNULA    pH, Arterial 7.408 7.350 - 7.450   pCO2 arterial 44.0 32.0 - 48.0 mmHg   pO2, Arterial 52.2 (L) 83.0 - 108.0 mmHg   Bicarbonate 26.4 20.0 - 28.0 mmol/L   Acid-Base Excess 2.9 (H) 0.0 - 2.0 mmol/L   O2 Saturation 84.1 %   Collection site RIGHT RADIAL    Drawn by [id number]    Sample type ARTERIAL    Allens test (pass/fail) PASS PASS  CBC     Status: Abnormal   Collection Time: 08/20/16  9:47 AM  Result Value Ref Range   WBC 10.4 4.0 - 10.5 K/uL   RBC 3.99 (L) 4.22 - 5.81 MIL/uL   Hemoglobin 10.7 (L) 13.0 - 17.0 g/dL   HCT 32.9 (L) 39.0 - 52.0 %   MCV 82.5 78.0 - 100.0 fL  MCH 26.8 26.0 - 34.0 pg   MCHC 32.5 30.0 - 36.0 g/dL   RDW 15.5 11.5 - 15.5 %   Platelets 191 150 - 400 K/uL  Creatinine, serum     Status: Abnormal   Collection Time: 08/20/16  9:47 AM  Result Value Ref Range   Creatinine, Ser 3.03 (H) 0.61 - 1.24 mg/dL   GFR calc non Af Amer 20 (L) >60 mL/min   GFR calc Af Amer 23 (L) >60 mL/min    Comment: (NOTE) The eGFR has been calculated using the CKD EPI equation. This calculation has not been validated in all clinical situations. eGFR's persistently <60 mL/min signify possible Chronic Kidney Disease.   Troponin I     Status: Abnormal   Collection Time: 08/20/16  9:47 AM  Result Value Ref Range   Troponin I 0.12 (HH) <0.03 ng/mL    Comment: CRITICAL VALUE NOTED.  VALUE IS CONSISTENT WITH PREVIOUSLY REPORTED AND CALLED VALUE.  Troponin I     Status: Abnormal    Collection Time: 08/20/16  3:23 PM  Result Value Ref Range   Troponin I 0.08 (HH) <0.03 ng/mL    Comment: CRITICAL VALUE NOTED.  VALUE IS CONSISTENT WITH PREVIOUSLY REPORTED AND CALLED VALUE.  Troponin I     Status: Abnormal   Collection Time: 08/20/16  9:14 PM  Result Value Ref Range   Troponin I 0.06 (HH) <0.03 ng/mL    Comment: CRITICAL VALUE NOTED.  VALUE IS CONSISTENT WITH PREVIOUSLY REPORTED AND CALLED VALUE.  CBC with Differential/Platelet     Status: Abnormal   Collection Time: 08/21/16  5:18 AM  Result Value Ref Range   WBC 12.3 (H) 4.0 - 10.5 K/uL   RBC 3.81 (L) 4.22 - 5.81 MIL/uL   Hemoglobin 10.5 (L) 13.0 - 17.0 g/dL   HCT 30.7 (L) 39.0 - 52.0 %   MCV 80.6 78.0 - 100.0 fL   MCH 27.6 26.0 - 34.0 pg   MCHC 34.2 30.0 - 36.0 g/dL   RDW 15.3 11.5 - 15.5 %   Platelets 199 150 - 400 K/uL   Neutrophils Relative % 83 %   Neutro Abs 10.2 (H) 1.7 - 7.7 K/uL   Lymphocytes Relative 9 %   Lymphs Abs 1.1 0.7 - 4.0 K/uL   Monocytes Relative 8 %   Monocytes Absolute 1.0 0.1 - 1.0 K/uL   Eosinophils Relative 0 %   Eosinophils Absolute 0.0 0.0 - 0.7 K/uL   Basophils Relative 0 %   Basophils Absolute 0.0 0.0 - 0.1 K/uL  Comprehensive metabolic panel     Status: Abnormal   Collection Time: 08/21/16  5:18 AM  Result Value Ref Range   Sodium 135 135 - 145 mmol/L   Potassium 4.1 3.5 - 5.1 mmol/L    Comment: DELTA CHECK NOTED   Chloride 99 (L) 101 - 111 mmol/L   CO2 27 22 - 32 mmol/L   Glucose, Bld 268 (H) 65 - 99 mg/dL   BUN 48 (H) 6 - 20 mg/dL   Creatinine, Ser 2.94 (H) 0.61 - 1.24 mg/dL   Calcium 7.6 (L) 8.9 - 10.3 mg/dL   Total Protein 6.0 (L) 6.5 - 8.1 g/dL   Albumin 2.5 (L) 3.5 - 5.0 g/dL   AST 15 15 - 41 U/L   ALT 13 (L) 17 - 63 U/L   Alkaline Phosphatase 53 38 - 126 U/L   Total Bilirubin 0.6 0.3 - 1.2 mg/dL   GFR calc non Af Amer 21 (L) >  60 mL/min   GFR calc Af Amer 24 (L) >60 mL/min    Comment: (NOTE) The eGFR has been calculated using the CKD EPI equation. This  calculation has not been validated in all clinical situations. eGFR's persistently <60 mL/min signify possible Chronic Kidney Disease.    Anion gap 9 5 - 15  Magnesium     Status: None   Collection Time: 08/21/16  5:18 AM  Result Value Ref Range   Magnesium 1.7 1.7 - 2.4 mg/dL  Phosphorus     Status: None   Collection Time: 08/21/16  5:18 AM  Result Value Ref Range   Phosphorus 3.8 2.5 - 4.6 mg/dL    Dg Chest 2 View  Result Date: 08/20/2016 CLINICAL DATA:  Shortness of breath. EXAM: CHEST  2 VIEW COMPARISON:  Radiograph 07/20/2016 FINDINGS: Cardiomegaly has progressed from prior exam. There is pulmonary edema and minimal fluid in the fissures. Streaky bibasilar opacities likely atelectasis. No confluent airspace disease. No pneumothorax. IMPRESSION: Increased cardiomegaly with pulmonary edema, consistent with CHF. Electronically Signed   By: Jeb Levering M.D.   On: 08/20/2016 03:15   Dg Lumbar Spine 2-3 Views  Result Date: 08/21/2016 CLINICAL DATA:  Low back and left leg pain, history of prior lumbar surgery EXAM: LUMBAR SPINE - 2-3 VIEW COMPARISON:  None FINDINGS: Osseous demineralization. Five non-rib-bearing lumbar vertebra. Disc space narrowing L4-L5. Facet degenerative changes lower lumbar spine at L4-L5 and L5-S1. Vertebral body heights maintained without fracture or subluxation. No bone destruction or spondylolysis. Atherosclerotic calcifications aorta and iliac arteries. LEFT upper quadrant calcifications correspond to adrenal calcifications on a prior chest CT. SI joints preserved. IMPRESSION: Osseous mineralization with degenerative disc and facet disease changes of lower lumbar spine. No acute bony abnormalities. LEFT adrenal calcifications which likely reflect prior hemorrhage or infection. Electronically Signed   By: Lavonia Dana M.D.   On: 08/21/2016 09:15   Dg Knee Complete 4 Views Left  Result Date: 08/20/2016 CLINICAL DATA:  Left hip and knee pain. Awoke unable to walk.  Swelling. EXAM: LEFT KNEE - COMPLETE 4+ VIEW COMPARISON:  None. FINDINGS: No acute fracture or subluxation. Bipartite patella with mild patellar spurring. No abnormal density, bony destructive change or periosteal reaction. Mild medial tibiofemoral joint space narrowing. Small knee joint effusion. Diffuse soft tissue edema. Vascular calcifications are seen. No soft tissue air. IMPRESSION: 1. Small knee joint effusion and diffuse soft tissue edema. 2. No acute osseous abnormality.  Mild osteoarthritis. Electronically Signed   By: Jeb Levering M.D.   On: 08/20/2016 03:11   Dg Hip Unilat With Pelvis 2-3 Views Left  Result Date: 08/20/2016 CLINICAL DATA:  Left hip pain.  Awoke today unable to walk. EXAM: DG HIP (WITH OR WITHOUT PELVIS) 2-3V LEFT COMPARISON:  None. FINDINGS: The cortical margins of the bony pelvis are intact. No fracture. Pubic symphysis and sacroiliac joints are congruent. Both femoral heads are well-seated in the respective acetabula. No abnormal density or bony destructive change. Mild osteoarthritis of both hip joints. There are vascular calcifications. IMPRESSION: Mild osteoarthritis.  No acute bony abnormality. Electronically Signed   By: Jeb Levering M.D.   On: 08/20/2016 03:13    Review of Systems  Constitutional: Negative for fever.  Respiratory: Negative for shortness of breath.   Cardiovascular: Negative for chest pain.  Genitourinary:       No urinary retention or loss of bladder control  Musculoskeletal: Positive for back pain.  Skin:       Darkening of the skin pretibial  areas both legs chronic  Neurological: Positive for tingling.       Numbness   Blood pressure 138/79, pulse 72, temperature 98 F (36.7 C), temperature source Oral, resp. rate 18, height 5' 11" (1.803 m), weight 296 lb 5.5 oz (134.4 kg), SpO2 94 %. Physical Exam  Constitutional: He is oriented to person, place, and time. He appears well-developed and well-nourished.  Obesity  Neurological: He  is alert and oriented to person, place, and time.  Psychiatric: He has a normal mood and affect.   ambulatory exam deferred because of pain  Both legs show significant and severe skin Changes they almost look purple to black from pretibial chronic edema. Left knee no tenderness in the anterior part of the knee peripatellar region or joint lines. Tenderness is in the popliteal fossa and posterior part of his left thigh with some tenderness in the anterior compartment and posterior compartment of his leg and foot strength muscle tone is normal the knee is stable range of motion in the knee is 5-115 degrees. His distal pulses are weak he has edema as stated there is no lymphadenopathy in the hip area and his straight leg raise is mildly positive he has normal sensation to soft touch    Assessment/Plan: Left knee and left hip and pelvic x-ray show mild arthritis in the knee and read as arthritis mild in the left hip. After review of both x-rays I agreed he has arthritis in his left knee and very minimal if any arthritis in his left hip  Recommend x-rays of his L-spine  I feel that most of this is more radicular type pain than knee effusion related.  He can be treated on a symptomatic basis and follow-up with primary care unless he has worsening the symptoms  Arther Abbott 08/21/2016, 9:55 AM

## 2016-08-21 NOTE — Progress Notes (Signed)
Progress Note  Patient Name: Gabriel Alvarez Date of Encounter: 08/21/2016  Primary Cardiologist: Dr. Loralie Champagne (CHF clinic)  Subjective   Feels better today, more alert. Legs are less painful.  Inpatient Medications    Scheduled Meds: . aspirin EC  81 mg Oral Daily  . atorvastatin  20 mg Oral Daily  . carvedilol  25 mg Oral BID WC  . clopidogrel  75 mg Oral Daily  . enoxaparin (LOVENOX) injection  40 mg Subcutaneous Q24H  . furosemide  40 mg Intravenous Q12H  . hydrALAZINE  75 mg Oral TID  . insulin aspart  50 Units Subcutaneous Q supper  . insulin glargine  90 Units Subcutaneous BH-q7a  . ipratropium-albuterol  3 mL Inhalation QID  . isosorbide mononitrate  120 mg Oral Daily  . nicotine  21 mg Transdermal Daily  . sodium chloride flush  3 mL Intravenous Q12H  . traZODone  100 mg Oral QHS    PRN Meds: sodium chloride, acetaminophen, ondansetron (ZOFRAN) IV, sodium chloride flush, traMADol   Vital Signs    Vitals:   08/20/16 1840 08/20/16 1959 08/20/16 2126 08/21/16 0625  BP:   (!) 148/67 138/79  Pulse:  76 74 72  Resp:  _0 Temp:   97.8 F (36.6 C) 98 F (36.7 C)  TempSrc:   Oral Oral  SpO2: 95% 95% 94% 94%  Weight:    296 lb 5.5 oz (134.4 kg)  Height:        Intake/Output Summary (Last 24 hours) at 08/21/16 0922 Last data filed at 08/21/16 0500  Gross per 24 hour  Intake                0 ml  Output             1950 ml  Net            -1950 ml   Filed Weights   08/20/16 0212 08/20/16 0825 08/21/16 0625  Weight: 290 lb (131.5 kg) 296 lb 6.4 oz (134.4 kg) 296 lb 5.5 oz (134.4 kg)    Telemetry    Sinus rhythm with occasional PVCs. Personally reviewed.   Physical Exam   GEN: Morbidly obese male. No acute distress.  Wearing oxygen via nasal cannula. Neck: Elevated JVP. Cardiac:  Distant, RRR, no S3.  Respiratory:  Decreased breath sounds without wheezing. GI:  Obese, nontender  MS:  Skin hyperpigmentation of the legs with improving  edema.  Labs    Chemistry Recent Labs Lab 08/20/16 0259 08/20/16 0947 08/21/16 0518  NA 141  --  135  K 3.4*  --  4.1  CL 103  --  99*  CO2 28  --  27  GLUCOSE 74  --  268*  BUN 38*  --  48*  CREATININE 3.13* 3.03* 2.94*  CALCIUM 7.8*  --  7.6*  PROT 6.2*  --  6.0*  ALBUMIN 2.8*  --  2.5*  AST 16  --  15  ALT 13*  --  13*  ALKPHOS 59  --  53  BILITOT 0.5  --  0.6  GFRNONAA 19* 20* 21*  GFRAA 22* 23* 24*  ANIONGAP 10  --  9     Hematology Recent Labs Lab 08/20/16 0259 08/20/16 0947 08/21/16 0518  WBC 10.9* 10.4 12.3*  RBC 3.85* 3.99* 3.81*  HGB 10.3* 10.7* 10.5*  HCT 31.6* 32.9* 30.7*  MCV 82.1 82.5 80.6  MCH 26.8 26.8 27.6  MCHC 32.6 32.5  34.2  RDW 15.6* 15.5 15.3  PLT 189 191 199    Cardiac Enzymes Recent Labs Lab 08/20/16 0503 08/20/16 0947 08/20/16 1523 08/20/16 2114  TROPONINI 0.14* 0.12* 0.08* 0.06*   No results for input(s): TROPIPOC in the last 168 hours.   BNP Recent Labs Lab 08/20/16 0259  BNP 251.0*     Radiology    Dg Chest 2 View  Result Date: 08/20/2016 CLINICAL DATA:  Shortness of breath. EXAM: CHEST  2 VIEW COMPARISON:  Radiograph 07/20/2016 FINDINGS: Cardiomegaly has progressed from prior exam. There is pulmonary edema and minimal fluid in the fissures. Streaky bibasilar opacities likely atelectasis. No confluent airspace disease. No pneumothorax. IMPRESSION: Increased cardiomegaly with pulmonary edema, consistent with CHF. Electronically Signed   By: Jeb Levering M.D.   On: 08/20/2016 03:15   Dg Lumbar Spine 2-3 Views  Result Date: 08/21/2016 CLINICAL DATA:  Low back and left leg pain, history of prior lumbar surgery EXAM: LUMBAR SPINE - 2-3 VIEW COMPARISON:  None FINDINGS: Osseous demineralization. Five non-rib-bearing lumbar vertebra. Disc space narrowing L4-L5. Facet degenerative changes lower lumbar spine at L4-L5 and L5-S1. Vertebral body heights maintained without fracture or subluxation. No bone destruction or  spondylolysis. Atherosclerotic calcifications aorta and iliac arteries. LEFT upper quadrant calcifications correspond to adrenal calcifications on a prior chest CT. SI joints preserved. IMPRESSION: Osseous mineralization with degenerative disc and facet disease changes of lower lumbar spine. No acute bony abnormalities. LEFT adrenal calcifications which likely reflect prior hemorrhage or infection. Electronically Signed   By: Lavonia Dana M.D.   On: 08/21/2016 09:15   Dg Knee Complete 4 Views Left  Result Date: 08/20/2016 CLINICAL DATA:  Left hip and knee pain. Awoke unable to walk. Swelling. EXAM: LEFT KNEE - COMPLETE 4+ VIEW COMPARISON:  None. FINDINGS: No acute fracture or subluxation. Bipartite patella with mild patellar spurring. No abnormal density, bony destructive change or periosteal reaction. Mild medial tibiofemoral joint space narrowing. Small knee joint effusion. Diffuse soft tissue edema. Vascular calcifications are seen. No soft tissue air. IMPRESSION: 1. Small knee joint effusion and diffuse soft tissue edema. 2. No acute osseous abnormality.  Mild osteoarthritis. Electronically Signed   By: Jeb Levering M.D.   On: 08/20/2016 03:11   Dg Hip Unilat With Pelvis 2-3 Views Left  Result Date: 08/20/2016 CLINICAL DATA:  Left hip pain.  Awoke today unable to walk. EXAM: DG HIP (WITH OR WITHOUT PELVIS) 2-3V LEFT COMPARISON:  None. FINDINGS: The cortical margins of the bony pelvis are intact. No fracture. Pubic symphysis and sacroiliac joints are congruent. Both femoral heads are well-seated in the respective acetabula. No abnormal density or bony destructive change. Mild osteoarthritis of both hip joints. There are vascular calcifications. IMPRESSION: Mild osteoarthritis.  No acute bony abnormality. Electronically Signed   By: Jeb Levering M.D.   On: 08/20/2016 03:13    Cardiac Studies   Right heart catheterization 06/30/2016: [facility] Procedural Findings: Hemodynamics (mmHg) RA mean 3 RV  70/6 PA 78/21, mean 45 PCWP mean 7  Oxygen saturations: PA 72% RV 72% RA 69% AO 88%  Cardiac Output (Fick) 9.82  Cardiac Index (Fick) 3.84 PVR 3.9 WU  Patient Profile     66 y.o. 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 He presents with progressive leg edema and volume overload, also continued problems with hypoxic respiratory failure. He is  being evaluated concurrently by Pulmonary.  Assessment & Plan    1. Acute on chronic combined heart failure, clinically improving with diuresis. He is on IV Lasix 40 mg twice daily with good urine output and actually some improvement in creatinine. Patient states that he had had increased fluid intake, indicated that he had been told by his PCP that he had he could drink as much water as he wanted to. Discussed appropriate fluid restriction, generally no more than 1500 cc in 24 hours.  2. Probable nonischemic cardiomyopathy with LVEF 15-20%. He is followed by Dr. Aundra Dubin in the heart failure clinic. Current regimen includes Coreg, hydralazine, and Imdur. Not on ACE inhibitor or ARB due to renal dysfunction.  3. CKD, stage 4. Creatinine down to 2.9.  4. History of CAD with remote RCA stenting, no active angina symptoms. Mild and relatively flat elevation in troponin I not suggestive of ACS, more consistent with heart failure.  5. OSA/OHS with chronic hypoxic respiratory failure. Agree with Dr. Luan Pulling that he likely has obstructive sleep apnea, however he refuses sleep testing and had difficulty tolerating CPAP during his last hospital stay. Tracheostomy had actually been discussed per Pulmonary when he was at [facility].  6. Severe pulmonary hypertension by recent right heart catheterization, group 3 and unlikely responsive to pulmonary  vasodilators.  Discussed with patient and wife today. He is feeling better. Would continue IV Lasix at 40 mg twice daily today, follow-up BMET tomorrow and urine output. May be able to be discharged within the next 24-48 hours. Would continue cardiac regimen including aspirin, Plavix, Coreg, hydralazine, and Imdur. At discharge he could be converted back to Demadex 40 mg daily. Recommend doubling dose if he gains 2-3 pounds in 24 hours or 5 pounds in a week. He should continue with daily weights and appropriate fluid restriction. Follow-up with heart failure clinic already scheduled for later this month.  Signed, Rozann Lesches, MD  08/21/2016, 9:22 AM

## 2016-08-21 NOTE — Progress Notes (Addendum)
Inpatient Diabetes Program Recommendations  AACE/ADA: New Consensus Statement on Inpatient Glycemic Control (2015)  Target Ranges:  Prepandial:   less than 140 mg/dL      Peak postprandial:   less than 180 mg/dL (1-2 hours)      Critically ill patients:  140 - 180 mg/dL   Results for Gabriel Alvarez, Gabriel Alvarez (MRN [id number]) as of 08/21/2016 08:49  Ref. Range 08/21/2016 05:18  Glucose Latest Ref Range: 65 - 99 mg/dL 268 (H)   Review of Glycemic Control  Diabetes history: DM 2 Outpatient Diabetes medications: Lantus 90 Daily, Humalog 50 units Daily at supper Current orders for Inpatient glycemic control: Lantus 90 Daily, Novolog 50 units Daily at supper  Inpatient Diabetes Program Recommendations:   Lab glucose >250 mg/dl. Please order CBGs, Novolog Moderate Correction TID + HS scale while inpatient.  Thanks,  Tama Headings RN, MSN, [facility] Inpatient Diabetes Coordinator Team Pager [phone] (8a-5p)

## 2016-08-21 NOTE — Consult Note (Signed)
Consult requested by:Dr. Alfredia Ferguson, [facility] Consult requested for: Respiratory failure  HPI: This is a 66 year old who is known to have hypertension gout hyperlipidemia chronic systolic heart failure with EF of 15-20% coronary disease has listed diagnosis of sleep apnea but when I asked him directly he says he's never had a sleep study. Pulmonary hypertension diabetes and who came to the emergency department because of leg pain. He said his pain was mostly in his knee. When he was evaluated and was found to have low oxygen saturation. He had some fever. He was found to have congestive heart failure exacerbation knee pain and acute on chronic respiratory failure. He does use oxygen at home. He denies any chest pain. He says he feels better as far as his breathing is concerned. No cough. No hemoptysis. No abdominal pain no nausea vomiting or diarrhea. No urinary symptoms. No trauma.  Past Medical History:  Diagnosis Date  . CAD (coronary artery disease)    Remote stent to RCA, 50% in-stent restenosis by cardiac catheterization 2005  . Cardiomyopathy ([place])    LVEF 15-20%, likely nonischemic  . CKD (chronic kidney disease) stage 4, GFR 15-29 ml/min (HCC)   . ED (erectile dysfunction)   . Essential hypertension   . Gout   . Hyperlipidemia   . Hypoventilation syndrome    Chronic hypoxic respiratory failure  . OSA (obstructive sleep apnea)   . Pulmonary hypertension    Secondary to OSA/OHS, severe by [place] 06/2016  . Type 2 diabetes mellitus (HCC)      Family History  Problem Relation Age of Onset  . Diabetes Father      Social History   Social History  . Marital status: Married    Spouse name: N/A  . Number of children: N/A  . Years of education: N/A   Occupational History  .      disabled   Social History Main Topics  . Smoking status: Former Smoker    Packs/day: 1.00    Years: 30.00    Types: Cigarettes, Cigars    Start date: 06/21/2016  . Smokeless tobacco: Never  Used     Comment: has stopped smoking cigarettes since last hospital stay  . Alcohol use No  . Drug use: No  . Sexual activity: Not Asked   Other Topics Concern  . None   Social History Narrative  . None     ROS: Except as mentioned 10 point review of systems is negative    Objective: Vital signs in last 24 hours: Temp:  [97.5 F (36.4 C)-98 F (36.7 C)] 98 F (36.7 C) (03/02 0625) Pulse Rate:  [72-76] 72 (03/02 0625) Resp:  [18] 18 (03/02 0625) BP: (138-163)/(67-79) 138/79 (03/02 0625) SpO2:  [79 %-95 %] 94 % (03/02 0625) Weight:  [134.4 kg (296 lb 5.5 oz)] 134.4 kg (296 lb 5.5 oz) (03/02 0625) Weight change: 2.903 kg (6 lb 6.4 oz) Last BM Date: 08/19/16  Intake/Output from previous day: 03/01 0701 - 03/02 0700 In: -  Out: 1950 [Urine:1950]  PHYSICAL EXAM Constitutional: When I came into the room he was asleep and snoring loudly but did not have any apnea. He was awakened and is in no acute distress. Eyes: Pupils react. EOMI. Ears nose mouth and throat: His mucous membranes are moist. Hearing grossly normal. Cardiovascular: His heart is regular I don't hear an S3 gallop. He has trace edema. Respiratory: His respiratory effort is normal. His lungs are clear. Gastrointestinal: His abdomen is soft with no  masses. Skin: Warm and dry. Musculoskeletal: He doesn't have any swelling of his knee. Neurological: No focal abnormalities. Psychiatric: Normal mood and affect  Lab Results: Basic Metabolic Panel:  Recent Labs  08/20/16 0259 08/20/16 0947 08/21/16 0518  NA 141  --  135  K 3.4*  --  4.1  CL 103  --  99*  CO2 28  --  27  GLUCOSE 74  --  268*  BUN 38*  --  48*  CREATININE 3.13* 3.03* 2.94*  CALCIUM 7.8*  --  7.6*  MG  --   --  1.7  PHOS  --   --  3.8   Liver Function Tests:  Recent Labs  08/20/16 0259 08/21/16 0518  AST 16 15  ALT 13* 13*  ALKPHOS 59 53  BILITOT 0.5 0.6  PROT 6.2* 6.0*  ALBUMIN 2.8* 2.5*   No results for input(s): LIPASE, AMYLASE in  the last 72 hours. No results for input(s): AMMONIA in the last 72 hours. CBC:  Recent Labs  08/20/16 0259 08/20/16 0947 08/21/16 0518  WBC 10.9* 10.4 12.3*  NEUTROABS 8.4*  --  10.2*  HGB 10.3* 10.7* 10.5*  HCT 31.6* 32.9* 30.7*  MCV 82.1 82.5 80.6  PLT 189 191 199   Cardiac Enzymes:  Recent Labs  08/20/16 0947 08/20/16 1523 08/20/16 2114  TROPONINI 0.12* 0.08* 0.06*   BNP: No results for input(s): PROBNP in the last 72 hours. D-Dimer: No results for input(s): DDIMER in the last 72 hours. CBG:  Recent Labs  08/20/16 0217 08/20/16 0420  GLUCAP 75 82   Hemoglobin A1C: No results for input(s): HGBA1C in the last 72 hours. Fasting Lipid Panel: No results for input(s): CHOL, HDL, LDLCALC, TRIG, CHOLHDL, LDLDIRECT in the last 72 hours. Thyroid Function Tests: No results for input(s): TSH, T4TOTAL, FREET4, T3FREE, THYROIDAB in the last 72 hours. Anemia Panel: No results for input(s): VITAMINB12, FOLATE, FERRITIN, TIBC, IRON, RETICCTPCT in the last 72 hours. Coagulation: No results for input(s): LABPROT, INR in the last 72 hours. Urine Drug Screen: Drugs of Abuse  No results found for: LABOPIA, COCAINSCRNUR, LABBENZ, AMPHETMU, THCU, LABBARB  Alcohol Level: No results for input(s): ETH in the last 72 hours. Urinalysis:  Recent Labs  08/20/16 0559  COLORURINE YELLOW  LABSPEC 1.010  PHURINE 6.0  GLUCOSEU NEGATIVE  HGBUR NEGATIVE  BILIRUBINUR NEGATIVE  KETONESUR NEGATIVE  PROTEINUR >300*  NITRITE NEGATIVE  LEUKOCYTESUR NEGATIVE   Misc. Labs:   ABGS:  Recent Labs  08/20/16 0620  PHART 7.408  PO2ART 52.2*  HCO3 26.4     MICROBIOLOGY: No results found for this or any previous visit (from the past 240 hour(s)).  Studies/Results: Dg Chest 2 View  Result Date: 08/20/2016 CLINICAL DATA:  Shortness of breath. EXAM: CHEST  2 VIEW COMPARISON:  Radiograph 07/20/2016 FINDINGS: Cardiomegaly has progressed from prior exam. There is pulmonary edema and  minimal fluid in the fissures. Streaky bibasilar opacities likely atelectasis. No confluent airspace disease. No pneumothorax. IMPRESSION: Increased cardiomegaly with pulmonary edema, consistent with CHF. Electronically Signed   By: Jeb Levering M.D.   On: 08/20/2016 03:15   Dg Knee Complete 4 Views Left  Result Date: 08/20/2016 CLINICAL DATA:  Left hip and knee pain. Awoke unable to walk. Swelling. EXAM: LEFT KNEE - COMPLETE 4+ VIEW COMPARISON:  None. FINDINGS: No acute fracture or subluxation. Bipartite patella with mild patellar spurring. No abnormal density, bony destructive change or periosteal reaction. Mild medial tibiofemoral joint space narrowing. Small knee joint effusion.  Diffuse soft tissue edema. Vascular calcifications are seen. No soft tissue air. IMPRESSION: 1. Small knee joint effusion and diffuse soft tissue edema. 2. No acute osseous abnormality.  Mild osteoarthritis. Electronically Signed   By: Jeb Levering M.D.   On: 08/20/2016 03:11   Dg Hip Unilat With Pelvis 2-3 Views Left  Result Date: 08/20/2016 CLINICAL DATA:  Left hip pain.  Awoke today unable to walk. EXAM: DG HIP (WITH OR WITHOUT PELVIS) 2-3V LEFT COMPARISON:  None. FINDINGS: The cortical margins of the bony pelvis are intact. No fracture. Pubic symphysis and sacroiliac joints are congruent. Both femoral heads are well-seated in the respective acetabula. No abnormal density or bony destructive change. Mild osteoarthritis of both hip joints. There are vascular calcifications. IMPRESSION: Mild osteoarthritis.  No acute bony abnormality. Electronically Signed   By: Jeb Levering M.D.   On: 08/20/2016 03:13    Medications:  Prior to Admission:  Prescriptions Prior to Admission  Medication Sig Dispense Refill Last Dose  . atorvastatin (LIPITOR) 20 MG tablet Take 1 tablet (20 mg total) by mouth daily. 90 tablet 3 unknown  . carvedilol (COREG) 25 MG tablet Take 1 tablet (25 mg total) by mouth 2 (two) times daily with a  meal. 180 tablet 3 unknown  . clopidogrel (PLAVIX) 75 MG tablet Take 75 mg by mouth daily.     unknown  . gabapentin (NEURONTIN) 300 MG capsule Take 300 mg by mouth 2 (two) times daily.   unknown  . hydrALAZINE (APRESOLINE) 25 MG tablet Take 3 tablets (75 mg total) by mouth 3 (three) times daily. 270 tablet 3 unknown  . hydrochlorothiazide (HYDRODIURIL) 25 MG tablet Take 25 mg by mouth daily.   unknown  . insulin glargine (LANTUS) 100 UNIT/ML injection Inject 0.9 mLs (90 Units total) into the skin every morning. 9 vial 2 unknown  . insulin lispro (HUMALOG KWIKPEN) 100 UNIT/ML KiwkPen Inject 0.6 mLs (60 Units total) into the skin daily with supper. (Patient taking differently: Inject 50 Units into the skin daily with supper. ) 60 mL 3 unknown  . ipratropium-albuterol (DUONEB) 0.5-2.5 (3) MG/3ML SOLN Inhale 3 mLs into the lungs 4 (four) times daily.   unknown  . isosorbide mononitrate (IMDUR) 120 MG 24 hr tablet Take 1 tablet (120 mg total) by mouth daily.   unknown  . nicotine (NICODERM CQ - DOSED IN MG/24 HOURS) 21 mg/24hr patch Place 1 patch (21 mg total) onto the skin daily. 28 patch 0 unknown  . torsemide (DEMADEX) 20 MG tablet Take 2 tablets (40 mg total) by mouth daily. 180 tablet 3 unknown  . traZODone (DESYREL) 100 MG tablet Take 100 mg by mouth at bedtime. Reported on 07/18/2015   unknown  . aspirin EC 81 MG tablet Take 1 tablet (81 mg total) by mouth daily. 90 tablet 3 Taking   Scheduled: . aspirin EC  81 mg Oral Daily  . atorvastatin  20 mg Oral Daily  . carvedilol  25 mg Oral BID WC  . clopidogrel  75 mg Oral Daily  . enoxaparin (LOVENOX) injection  40 mg Subcutaneous Q24H  . furosemide  40 mg Intravenous Q12H  . hydrALAZINE  75 mg Oral TID  . insulin aspart  50 Units Subcutaneous Q supper  . insulin glargine  90 Units Subcutaneous BH-q7a  . ipratropium-albuterol  3 mL Inhalation QID  . isosorbide mononitrate  120 mg Oral Daily  . nicotine  21 mg Transdermal Daily  . sodium  chloride flush  3 mL Intravenous  Q12H  . traZODone  100 mg Oral QHS   Continuous:  UEB:VPLWUZ chloride, acetaminophen, ondansetron (ZOFRAN) IV, sodium chloride flush, traMADol  Assesment: He was admitted with acute on chronic congestive heart failure and acute on chronic hypoxic respiratory failure. Although I agree he almost certainly has sleep apnea and obesity hypoventilation and less he totally misunderstood and he denies ever having had a sleep study done and he says he does not use CPAP. This is something that can be addressed as an outpatient. He seems better as far as his breathing is concerned. I think this was mostly from heart failure. I think he has some element of COPD as well. Principal Problem:   Acute on chronic congestive heart failure (HCC) Active Problems:   HYPERCHOLESTEROLEMIA   Gout   Essential hypertension   Coronary artery disease involving native coronary artery of native heart without angina pectoris   Diabetes mellitus type 2 with peripheral artery disease (HCC)   Morbid obesity ([place])   Obesity hypoventilation syndrome ([place])   PAH (pulmonary artery hypertension)   Chronic systolic CHF (congestive heart failure) (HCC)   CKD (chronic kidney disease), stage IV (HCC)   Acute on chronic respiratory failure (HCC)   Fever   Hypoxia   Pulmonary hypertension    Plan: No change in treatments. Information from hospital stay in January shows that there was even discussion of tracheostomy. I will investigate again whether he is using CPAP at home. I asked him again and his wife was present and he is not using CPAP at home  I would not change current treatments. I will be out of town until the 12th thanks for allowing me to see him with you   LOS: 1 day   Lidwina Kaner L 08/21/2016, 8:32 AM

## 2016-08-21 NOTE — Care Management Note (Addendum)
Case Management Note  Patient Details  Name: Gabriel Alvarez MRN: [id number] Date of Birth: 04-25-1951  Subjective/Objective:    Adm with acute on chronic CHF / acute on chronic respiratory failure. He is from home with wife, ind with ADL's. Walks with a rollator, has high flow oxygen at home PTA. He does go to [employer] twice a week. He reports more fatigue and weakness lately. Will benefit from [facility] RN for disease management.  PT recommends WC . CM contacted [employer]  @ [phone] for [facility] RN, WC and shower seat. Per [employer], all items approved. Intake # [id number].   WC and shower seat will be delivered to patient's home. Patient's demographic given to [employer].          Action/Plan: Anticipate DC home with self care . Family updated.   Expected Discharge Date:  08/22/16               Expected Discharge Plan:  [place]  In-House Referral:     Discharge planning Services  CM Consult  Post Acute Care Choice:  Durable Medical Equipment Choice offered to:  Patient  DME Arranged:  Wheelchair manual, Shower stool DME Agency:     HH Arranged:  RN, Disease Management [place] Agency:     Status of Service:  Completed, signed off  If discussed at [place] of Stay Meetings, dates discussed:    Additional Comments:  Samul Mcinroy, Chauncey Reading, RN 08/21/2016, 10:38 AM

## 2016-08-21 NOTE — Progress Notes (Signed)
Pt refuses cpap

## 2016-08-21 NOTE — Final Consult Note (Signed)
Consult note final  Review of x-rays L-spine show chronic degenerative changes  Most likely cause of his acute left radicular pain  Recommend management with oral anti-inflammatories or oral steroids  If neither of those are possible based on his medical history then Tylenol would be an alternative along with a local measures such as a heating pad and muscle relaxers.  No follow-up with orthopedics needed  If he continues to have trouble please see surgeon who operated on his spine

## 2016-08-22 DIAGNOSIS — M5410 Radiculopathy, site unspecified: Secondary | ICD-10-CM

## 2016-08-22 LAB — CBC WITH DIFFERENTIAL/PLATELET
BASOS ABS: 0 10*3/uL (ref 0.0–0.1)
Basophils Relative: 0 %
EOS PCT: 2 %
Eosinophils Absolute: 0.2 10*3/uL (ref 0.0–0.7)
HCT: 30.8 % — ABNORMAL LOW (ref 39.0–52.0)
HEMOGLOBIN: 10.4 g/dL — AB (ref 13.0–17.0)
LYMPHS PCT: 18 %
Lymphs Abs: 2 10*3/uL (ref 0.7–4.0)
MCH: 27.4 pg (ref 26.0–34.0)
MCHC: 33.8 g/dL (ref 30.0–36.0)
MCV: 81.1 fL (ref 78.0–100.0)
Monocytes Absolute: 0.8 10*3/uL (ref 0.1–1.0)
Monocytes Relative: 7 %
NEUTROS PCT: 73 %
Neutro Abs: 8.1 10*3/uL — ABNORMAL HIGH (ref 1.7–7.7)
PLATELETS: 223 10*3/uL (ref 150–400)
RBC: 3.8 MIL/uL — AB (ref 4.22–5.81)
RDW: 15.3 % (ref 11.5–15.5)
WBC: 11.1 10*3/uL — AB (ref 4.0–10.5)

## 2016-08-22 LAB — COMPREHENSIVE METABOLIC PANEL
ALT: 13 U/L — AB (ref 17–63)
AST: 13 U/L — AB (ref 15–41)
Albumin: 2.6 g/dL — ABNORMAL LOW (ref 3.5–5.0)
Alkaline Phosphatase: 54 U/L (ref 38–126)
Anion gap: 11 (ref 5–15)
BUN: 53 mg/dL — AB (ref 6–20)
CHLORIDE: 101 mmol/L (ref 101–111)
CO2: 27 mmol/L (ref 22–32)
CREATININE: 2.91 mg/dL — AB (ref 0.61–1.24)
Calcium: 7.9 mg/dL — ABNORMAL LOW (ref 8.9–10.3)
GFR calc Af Amer: 24 mL/min — ABNORMAL LOW (ref >60)
GFR calc non Af Amer: 21 mL/min — ABNORMAL LOW (ref >60)
Glucose, Bld: 63 mg/dL — ABNORMAL LOW (ref 65–99)
Potassium: 3.5 mmol/L (ref 3.5–5.1)
SODIUM: 139 mmol/L (ref 135–145)
Total Bilirubin: 0.4 mg/dL (ref 0.3–1.2)
Total Protein: 6 g/dL — ABNORMAL LOW (ref 6.5–8.1)

## 2016-08-22 LAB — MAGNESIUM: Magnesium: 1.9 mg/dL (ref 1.7–2.4)

## 2016-08-22 LAB — PHOSPHORUS: Phosphorus: 4.2 mg/dL (ref 2.5–4.6)

## 2016-08-22 LAB — GLUCOSE, CAPILLARY
Glucose-Capillary: 52 mg/dL — ABNORMAL LOW (ref 65–99)
Glucose-Capillary: 121 mg/dL — ABNORMAL HIGH (ref 65–99)

## 2016-08-22 MED ORDER — DM-GUAIFENESIN ER 30-600 MG PO TB12: 1.0000 | ORAL_TABLET | Freq: Two times a day (BID) | ORAL | 0 refills | Status: AC

## 2016-08-22 MED ORDER — DM-GUAIFENESIN ER 30-600 MG PO TB12
1.0000 | ORAL_TABLET | Freq: Two times a day (BID) | ORAL | Status: DC
  Administered 2016-08-22: 1 via ORAL
  Filled 2016-08-22: qty 1

## 2016-08-22 NOTE — Discharge Summary (Signed)
Physician Discharge Summary  SHA AMER PJK:[id number] DOB: Dec 02, 1950 DOA: 08/20/2016  PCP: Manon Hilding, MD  Admit date: 08/20/2016 Discharge date: 08/22/2016  Admitted From: Home Disposition:  Home with [place] and will resume outpatient PT  Recommendations for Outpatient Follow-up:  1. Follow up with PCP in 1-2 weeks 2. Follow up with Cardiology Dr. Aundra Dubin in 1 week 3. Follow up with Pulmonary in 1 week and have outpatient sleep study to evaluate OSA 4. Please obtain BMP/CBC in one week  Home Health: YES Equipment/Devices: Radio broadcast assistant  Discharge Condition: Stable CODE STATUS: FULL CODE Diet recommendation: Heart Healthy / Carb Modified   Brief/Interim Summary: Nasean Zapf Moyeris a 66 y.o.malewith medical history significant of HTN, Gout, HLD, Chronic Systolic CHF with EF of 80-99%, CAD, OHS/OSA, pHTN, Insulin Dependent Diabetes and other comorbidities who presented to [facility] with Leg Pain and inability to get out of bed. Patient states he developed severe knee pain and was unable to ambulate. Denied SOB but O2 Saturations were lower on baseline O2. Also complained of some increase in leg swelling. No nausea, vomiting, or Cp. States symptoms started yesterday and got progressively worse. Per patient had a slight temperature overnight and wife had to give him some snackes because CBG was low. Denied any other complaints or concerns and [facility] was asked to admit for Acute on Chronic Respiratory Failure, Suspected CHF Exacerbation, and Severe Left Knee Pain. Patients Knee pain is improved and diuresing well. Was evaluated by Pulmonary, Cardiology, and Orthopedic Surgery. Patient's knee pain was suspected to be radicular in nature and Patient's Hypoxia improved and he diuresed well. At this time was deemed medically stable to be D/C'd home and is to follow up with PCP, Cardiology, and Pulmonology as an outpatient. He will need evaluation for his OSA with a sleep  study.  Discharge Diagnoses:  Principal Problem:   Acute on chronic congestive heart failure (HCC) Active Problems:   HYPERCHOLESTEROLEMIA   Acute gout of left knee   Essential hypertension   Coronary artery disease involving native coronary artery of native heart without angina pectoris   Diabetes mellitus type 2 with peripheral artery disease (HCC)   Morbid obesity (HCC)   Obesity hypoventilation syndrome (HCC)   PAH (pulmonary artery hypertension)   Chronic systolic CHF (congestive heart failure) (HCC)   CKD (chronic kidney disease), stage IV (HCC)   Acute on chronic respiratory failure (HCC)   Fever   Hypoxia   Pulmonary hypertension   Leg pain   Leukocytosis  Acute on Chronic Hypoxic Respiratory Failure likely Multifactorial and from pHTN, CHF Exacerbation, and OHS/OSA, improved -Admitted to Medical Floor with Telemetry -Desaturated on Normal 4-5 Liters to 84% on admission -CXR showed Increased Cardiomegaly with Pulmonary Edema, consistent with CHF -Continue Supplemental O2 via [place] -DuoNeb Breathing Tx 4 Times daily -ABG showed Hypoxemia  -Maintain O2 Saturations >92% -CPAP ordered for qHS and prn but patient refused -Appreciated Pulmonary Evaluation; Patient not on CPAP and recommends outpatient evaluation  -Patient on Home O2 and stable and will need to follow up with PCP, Cardiology, and Pulmonology at D/C  Suspected Acute Decompensation of Chronic Systolic Heart failure with EF of 15-20% -ECHO and Right Sided Cath Done recently in January -Strict I's and O's, Daily Weights, SLIV, and Fluid Restrict; Patient was not compliant with fluid restriction as an outpatient  -CXR showed Increased Cardiomegaly with Pulmonary Edema, consistent with CHF -IV Diuresis with Lasix 40 mg BID today (was given IV 60 mg  in Ed) while in the hospital; Was on Demadex 40 mg Daily as an outpatient and will resume at D/C -Patient Diuresed 2.5 Liters so far. -BNP was 251.0 -C/w Imdur 120 mg po  Daily,Hydralazine 75 mg po TID, and with Carvedilol 25 mg po BID; No ACE/ARB because Renal Insufficiency  -Appreciated Cardiology Consultation and Evaluation; Cardiology discussed with patient about Fluid Restriction and recommended doubling Demadex dose as an outpatient if patient gains 2-3 pounds in 24 hours or 5 pounds in a week.  -Heart Failure Clinic Appointment scheduled later for this month; Follow up with Dr. Aundra Dubin as an outpatient.   Left Leg and Left Knee Pain, ? Gout Flare -Uric Acid Level was 12.2; Unable to give Allopurinol because of Kidney Fxn -X-Ray of Hip showed mild osteoarthritis and no acute bony abnormality, and X-Ray of knee showed small knee joint effusion and diffuse soft tissue edema with no acute osseous abnormality and mild osteoarthritis -Given IV Dexamethasone 4 mg in the ED -Orthopedics Dr. Aline Brochure consulted and appreciated Recc's -X-Rays reviewed by Dr. Aline Brochure and he agrees that the patient has Arthritis in the Left Knee and minimal Arthritis in the Left Hip; He recommended X-Rays of his L Spine as he felt the pain was more radicular type pain rather than knee effusion related -X-Rays of L spine showed chronic degenerative changes and was most likely the cause of his Acute Left Radicular Pain -Dr. Aline Brochure recommended Management with Oral Anti-Inflammatories or oral steroids however unable to use them because of Heart Failure and Kidney Disease; Reccommended Tylenol along with Heating Pad and Muscle Relaxers -If patient continues to have trouble Dr. Aline Brochure recommended that he see the surgeon who operated on the Pateint's spine -PT Recommended Home Health PT vs Outpatient PT  Elevated Troponin, stable -In the setting of CKD and Heart Failure -Troponin Trending down from 0.15 -> 0.08 -> 0.06 -No Chest Pain  Hypokalemia, improved -Patient's K+ was 3.4 yesterday and improved to 4.1 and now 3.5 -Repeat CMP in AM  OHS/OSA -Ordered CPAP but patient  refused -Appreciated Pulmonary Evaluation; Recommended no change in current treatments -Patient will need outpatient OSA evaluation if agreeable; Follow up with Pulmonary at D/C  CAD with RCA Stenting -C/w ASA 81 mg po daily, Atorvastatin 20 mg Daily, Coreg 25 mg po BID  Diabetes Type 2 -C/w Lantus 90 units and with Novolog 50 units sq Daily at Home and discuss with PCP about lower dose -Diabetic Education Coordinator Reccs appreciated -Recommended Starting Moderate Novolog Correction SSI AC HS -Continue to Monitor CBG's closely; CBG while in the hospital was 52-303  CKD Stage IV -Patient's BUN/Cr was 38/3.13 on admission and went to 53/2.91 -Continue to Monitor CMP and watch Cr and UOP as patient is being diuresed  Pulmonary HTN -Had recent RHC in January which showed PASP of 78 mmHg -Appreciated Cardiology and Pulmonary Evaluation -Per Cardiology Patient is unlikely going to be responsive to pulmonary vasodilators. -Follow up with Cardiology and Pulmonology as an outpatient  Leukocytosis, improving -Afebrile -Mild and Unclear Etiology as went from 10.4 -> 12.3 -> 11.1; Possibly from IV Decadron given  -No S/Sx of Infection; Likely reactive -Continue to Monitor for S/Sx of Infection and Repeat CBC as an outpatient  Discharge Instructions  Discharge Instructions    Call MD for:  difficulty breathing, headache or visual disturbances    Complete by:  As directed    Call MD for:  extreme fatigue    Complete by:  As directed  Call MD for:  persistant dizziness or light-headedness    Complete by:  As directed    Call MD for:  persistant nausea and vomiting    Complete by:  As directed    Call MD for:  severe uncontrolled pain    Complete by:  As directed    Call MD for:  temperature >100.4    Complete by:  As directed    Diet - low sodium heart healthy    Complete by:  As directed    Discharge instructions    Complete by:  As directed    Follow up with PCP,  Cardiology and Pulmonary as an outpatient. Take all medications as prescribed. Make sure to follow up with Pulmonary for outpatient sleep study. If symptoms change or worsen please return to the ED for re-evaluation.   Increase activity slowly    Complete by:  As directed      Allergies as of 08/22/2016   No Known Allergies     Medication List    TAKE these medications   aspirin EC 81 MG tablet Take 1 tablet (81 mg total) by mouth daily.   atorvastatin 20 MG tablet Commonly known as:  LIPITOR Take 1 tablet (20 mg total) by mouth daily.   carvedilol 25 MG tablet Commonly known as:  COREG Take 1 tablet (25 mg total) by mouth 2 (two) times daily with a meal.   clopidogrel 75 MG tablet Commonly known as:  PLAVIX Take 75 mg by mouth daily.   dextromethorphan-guaiFENesin 30-600 MG 12hr tablet Commonly known as:  MUCINEX DM Take 1 tablet by mouth 2 (two) times daily.   fluticasone 50 MCG/ACT nasal spray Commonly known as:  FLONASE Place 1 spray into both nostrils daily as needed for allergies.   hydrALAZINE 25 MG tablet Commonly known as:  APRESOLINE Take 3 tablets (75 mg total) by mouth 3 (three) times daily. What changed:  when to take this   insulin glargine 100 UNIT/ML injection Commonly known as:  LANTUS Inject 0.9 mLs (90 Units total) into the skin every morning.   insulin lispro 100 UNIT/ML KiwkPen Commonly known as:  HUMALOG KWIKPEN Inject 0.6 mLs (60 Units total) into the skin daily with supper. What changed:  how much to take  when to take this   ipratropium-albuterol 0.5-2.5 (3) MG/3ML Soln Commonly known as:  DUONEB Inhale 3 mLs into the lungs 3 (three) times daily.   isosorbide mononitrate 120 MG 24 hr tablet Commonly known as:  IMDUR Take 1 tablet (120 mg total) by mouth daily.   nicotine 21 mg/24hr patch Commonly known as:  NICODERM CQ - dosed in mg/24 hours Place 1 patch (21 mg total) onto the skin daily.   torsemide 20 MG tablet Commonly known  as:  DEMADEX Take 2 tablets (40 mg total) by mouth daily.   traZODone 100 MG tablet Commonly known as:  DESYREL Take 100 mg by mouth at bedtime. Reported on 07/18/2015   VICKS SINEX 0.05 % nasal spray Generic drug:  oxymetazoline Place 1 spray into both nostrils 2 (two) times daily as needed for congestion.      Follow-up Information    Manon Hilding, MD. Call in 1 week(s).   Specialty:  Family Medicine Why:  Call to scheudle appointment within 1 Week  Contact information: [place] [address] [phone]        Loralie Champagne, MD. Call in 1 week(s).   Specialty:  Cardiology Why:  Call  to schedule Appointment within 1 week Contact information: [place] [address] [phone]        Sarah F Groce, NP. Call in 1 week(s).   Specialty:  Pulmonary Disease Why:  Call to schedule appointment within 1 week Contact information: [address] [place] [address] [phone]          No Known Allergies  Consultations:  Cardiology  Pulmonary  Orthopedic Surgery  Procedures/Studies: Dg Chest 2 View  Result Date: 08/20/2016 CLINICAL DATA:  Shortness of breath. EXAM: CHEST  2 VIEW COMPARISON:  Radiograph 07/20/2016 FINDINGS: Cardiomegaly has progressed from prior exam. There is pulmonary edema and minimal fluid in the fissures. Streaky bibasilar opacities likely atelectasis. No confluent airspace disease. No pneumothorax. IMPRESSION: Increased cardiomegaly with pulmonary edema, consistent with CHF. Electronically Signed   By: Jeb Levering M.D.   On: 08/20/2016 03:15   Dg Lumbar Spine 2-3 Views  Result Date: 08/21/2016 CLINICAL DATA:  Low back and left leg pain, history of prior lumbar surgery EXAM: LUMBAR SPINE - 2-3 VIEW COMPARISON:  None FINDINGS: Osseous demineralization. Five non-rib-bearing lumbar vertebra. Disc space narrowing L4-L5. Facet degenerative changes lower lumbar spine at L4-L5 and L5-S1. Vertebral body heights  maintained without fracture or subluxation. No bone destruction or spondylolysis. Atherosclerotic calcifications aorta and iliac arteries. LEFT upper quadrant calcifications correspond to adrenal calcifications on a prior chest CT. SI joints preserved. IMPRESSION: Osseous mineralization with degenerative disc and facet disease changes of lower lumbar spine. No acute bony abnormalities. LEFT adrenal calcifications which likely reflect prior hemorrhage or infection. Electronically Signed   By: Lavonia Dana M.D.   On: 08/21/2016 09:15   Dg Knee Complete 4 Views Left  Result Date: 08/20/2016 CLINICAL DATA:  Left hip and knee pain. Awoke unable to walk. Swelling. EXAM: LEFT KNEE - COMPLETE 4+ VIEW COMPARISON:  None. FINDINGS: No acute fracture or subluxation. Bipartite patella with mild patellar spurring. No abnormal density, bony destructive change or periosteal reaction. Mild medial tibiofemoral joint space narrowing. Small knee joint effusion. Diffuse soft tissue edema. Vascular calcifications are seen. No soft tissue air. IMPRESSION: 1. Small knee joint effusion and diffuse soft tissue edema. 2. No acute osseous abnormality.  Mild osteoarthritis. Electronically Signed   By: Jeb Levering M.D.   On: 08/20/2016 03:11   Dg Hip Unilat With Pelvis 2-3 Views Left  Result Date: 08/20/2016 CLINICAL DATA:  Left hip pain.  Awoke today unable to walk. EXAM: DG HIP (WITH OR WITHOUT PELVIS) 2-3V LEFT COMPARISON:  None. FINDINGS: The cortical margins of the bony pelvis are intact. No fracture. Pubic symphysis and sacroiliac joints are congruent. Both femoral heads are well-seated in the respective acetabula. No abnormal density or bony destructive change. Mild osteoarthritis of both hip joints. There are vascular calcifications. IMPRESSION: Mild osteoarthritis.  No acute bony abnormality. Electronically Signed   By: Jeb Levering M.D.   On: 08/20/2016 03:13     Subjective: Seen and examined and states knee was sore  but better. Able to move it better. No nausea or vomiting. Stated Nasal congestion was better and that he was a little constipated. No CP or SOB. No other concerns or complaints at this time and is ready to go home.   Discharge Exam: Vitals:   08/21/16 2100 08/22/16 0500  BP: (!) 165/68 (!) 154/60  Pulse: 68 64  Resp: 18 18  Temp: 97.7 F (36.5 C) 97.6 F (36.4 C)   Vitals:   08/21/16  2044 08/21/16 2100 08/22/16 0500 08/22/16 0810  BP:  (!) 165/68 (!) 154/60   Pulse:  68 64   Resp:  18 18   Temp:  97.7 F (36.5 C) 97.6 F (36.4 C)   TempSrc:  Oral Oral   SpO2: 95% 99% 99% 94%  Weight:   134.4 kg (296 lb 4.8 oz)   Height:       General: Pt is alert, awake, not in acute distress Cardiovascular: RRR, S1/S2 +, no rubs, no gallops Respiratory: Diminished bilaterally, no wheezing, no rhonchi; Patient was not tachypenic or using any accessory muscles to breathe but was wearing Supplemental O2 via [place].  Abdominal: Soft, NT, ND, bowel sounds + Extremities: Very slight edema, no cyanosis  The results of significant diagnostics from this hospitalization (including imaging, microbiology, ancillary and laboratory) are listed below for reference.    Microbiology: No results found for this or any previous visit (from the past 240 hour(s)).   Labs: BNP (last 3 results)  Recent Labs  06/23/16 2326 07/17/16 1011 08/20/16 0259  BNP 355.0* 77.8 222.9*   Basic Metabolic Panel:  Recent Labs Lab 08/20/16 0259 08/20/16 0947 08/21/16 0518 08/22/16 0630  NA 141  --  135 139  K 3.4*  --  4.1 3.5  CL 103  --  99* 101  CO2 28  --  27 27  GLUCOSE 74  --  268* 63*  BUN 38*  --  48* 53*  CREATININE 3.13* 3.03* 2.94* 2.91*  CALCIUM 7.8*  --  7.6* 7.9*  MG  --   --  1.7 1.9  PHOS  --   --  3.8 4.2   Liver Function Tests:  Recent Labs Lab 08/20/16 0259 08/21/16 0518 08/22/16 0630  AST 16 15 13*  ALT 13* 13* 13*  ALKPHOS 59 53 54  BILITOT 0.5 0.6 0.4  PROT 6.2* 6.0* 6.0*   ALBUMIN 2.8* 2.5* 2.6*   No results for input(s): LIPASE, AMYLASE in the last 168 hours. No results for input(s): AMMONIA in the last 168 hours. CBC:  Recent Labs Lab 08/20/16 0259 08/20/16 0947 08/21/16 0518 08/22/16 0630  WBC 10.9* 10.4 12.3* 11.1*  NEUTROABS 8.4*  --  10.2* 8.1*  HGB 10.3* 10.7* 10.5* 10.4*  HCT 31.6* 32.9* 30.7* 30.8*  MCV 82.1 82.5 80.6 81.1  PLT 189 191 199 223   Cardiac Enzymes:  Recent Labs Lab 08/20/16 0259 08/20/16 0503 08/20/16 0947 08/20/16 1523 08/20/16 2114  TROPONINI 0.15* 0.14* 0.12* 0.08* 0.06*   BNP: Invalid input(s): POCBNP CBG:  Recent Labs Lab 08/20/16 0420 08/21/16 1535 08/21/16 2131 08/22/16 0750 08/22/16 1149  GLUCAP 82 410* 303* 52* 121*   D-Dimer No results for input(s): DDIMER in the last 72 hours. Hgb A1c No results for input(s): HGBA1C in the last 72 hours. Lipid Profile No results for input(s): CHOL, HDL, LDLCALC, TRIG, CHOLHDL, LDLDIRECT in the last 72 hours. Thyroid function studies No results for input(s): TSH, T4TOTAL, T3FREE, THYROIDAB in the last 72 hours.  Invalid input(s): FREET3 Anemia work up No results for input(s): VITAMINB12, FOLATE, FERRITIN, TIBC, IRON, RETICCTPCT in the last 72 hours. Urinalysis    Component Value Date/Time   COLORURINE YELLOW 08/20/2016 0559   APPEARANCEUR CLEAR 08/20/2016 0559   LABSPEC 1.010 08/20/2016 0559   PHURINE 6.0 08/20/2016 0559   GLUCOSEU NEGATIVE 08/20/2016 0559   HGBUR NEGATIVE 08/20/2016 0559   BILIRUBINUR NEGATIVE 08/20/2016 0559   KETONESUR NEGATIVE 08/20/2016 0559   PROTEINUR >300 (A) 08/20/2016 0559  NITRITE NEGATIVE 08/20/2016 0559   LEUKOCYTESUR NEGATIVE 08/20/2016 0559   Sepsis Labs Invalid input(s): PROCALCITONIN,  WBC,  LACTICIDVEN Microbiology No results found for this or any previous visit (from the past 240 hour(s)).  Time coordinating discharge: Over 30 minutes  SIGNED:  Kerney Elbe, DO [facility] 08/22/2016, 1:02  PM Pager [phone]  If 7PM-7AM, please contact night-coverage [email or web address] Password [email or web address]

## 2016-08-24 ENCOUNTER — Telehealth: Payer: Self-pay | Admitting: Acute Care

## 2016-08-24 NOTE — Telephone Encounter (Signed)
Error faxing over forrm

## 2016-08-25 ENCOUNTER — Telehealth: Payer: Self-pay | Admitting: Acute Care

## 2016-08-25 NOTE — Telephone Encounter (Signed)
Checked SG's look at. This order form has not been received. Attempted to call [employer]. Was placed on a long hold. Will try back later.

## 2016-08-26 ENCOUNTER — Institutional Professional Consult (permissible substitution): Payer: Medicare Other | Admitting: Pulmonary Disease

## 2016-08-26 NOTE — Telephone Encounter (Signed)
Called the local [place] office and spoke with [place]. She has located this form and will refax it to [place]. Will await fax.

## 2016-08-27 NOTE — Telephone Encounter (Signed)
Form has been received and placed in SG's review folder.

## 2016-08-29 DIAGNOSIS — Z955 Presence of coronary angioplasty implant and graft: Secondary | ICD-10-CM | POA: Diagnosis not present

## 2016-08-29 DIAGNOSIS — Z87891 Personal history of nicotine dependence: Secondary | ICD-10-CM | POA: Diagnosis not present

## 2016-08-29 DIAGNOSIS — I5023 Acute on chronic systolic (congestive) heart failure: Secondary | ICD-10-CM | POA: Diagnosis present

## 2016-08-29 DIAGNOSIS — N189 Chronic kidney disease, unspecified: Secondary | ICD-10-CM | POA: Diagnosis not present

## 2016-08-29 DIAGNOSIS — D649 Anemia, unspecified: Secondary | ICD-10-CM | POA: Diagnosis not present

## 2016-08-29 DIAGNOSIS — N183 Chronic kidney disease, stage 3 (moderate): Secondary | ICD-10-CM | POA: Diagnosis present

## 2016-08-29 DIAGNOSIS — G8929 Other chronic pain: Secondary | ICD-10-CM | POA: Diagnosis present

## 2016-08-29 DIAGNOSIS — M6281 Muscle weakness (generalized): Secondary | ICD-10-CM | POA: Diagnosis not present

## 2016-08-29 DIAGNOSIS — Z9119 Patient's noncompliance with other medical treatment and regimen: Secondary | ICD-10-CM | POA: Diagnosis not present

## 2016-08-29 DIAGNOSIS — I2510 Atherosclerotic heart disease of native coronary artery without angina pectoris: Secondary | ICD-10-CM | POA: Diagnosis present

## 2016-08-29 DIAGNOSIS — N179 Acute kidney failure, unspecified: Secondary | ICD-10-CM | POA: Diagnosis present

## 2016-08-29 DIAGNOSIS — M898X9 Other specified disorders of bone, unspecified site: Secondary | ICD-10-CM | POA: Diagnosis present

## 2016-08-29 DIAGNOSIS — J9611 Chronic respiratory failure with hypoxia: Secondary | ICD-10-CM | POA: Diagnosis not present

## 2016-08-29 DIAGNOSIS — R748 Abnormal levels of other serum enzymes: Secondary | ICD-10-CM | POA: Diagnosis present

## 2016-08-29 DIAGNOSIS — E1122 Type 2 diabetes mellitus with diabetic chronic kidney disease: Secondary | ICD-10-CM | POA: Diagnosis present

## 2016-08-29 DIAGNOSIS — Z743 Need for continuous supervision: Secondary | ICD-10-CM | POA: Diagnosis not present

## 2016-08-29 DIAGNOSIS — I130 Hypertensive heart and chronic kidney disease with heart failure and stage 1 through stage 4 chronic kidney disease, or unspecified chronic kidney disease: Secondary | ICD-10-CM | POA: Diagnosis not present

## 2016-08-29 DIAGNOSIS — Z833 Family history of diabetes mellitus: Secondary | ICD-10-CM | POA: Diagnosis not present

## 2016-08-29 DIAGNOSIS — I252 Old myocardial infarction: Secondary | ICD-10-CM | POA: Diagnosis not present

## 2016-08-29 DIAGNOSIS — M25562 Pain in left knee: Secondary | ICD-10-CM | POA: Diagnosis present

## 2016-08-29 DIAGNOSIS — E7800 Pure hypercholesterolemia, unspecified: Secondary | ICD-10-CM | POA: Diagnosis present

## 2016-08-29 DIAGNOSIS — Z6841 Body Mass Index (BMI) 40.0 and over, adult: Secondary | ICD-10-CM | POA: Diagnosis not present

## 2016-08-29 DIAGNOSIS — D638 Anemia in other chronic diseases classified elsewhere: Secondary | ICD-10-CM | POA: Diagnosis present

## 2016-08-29 DIAGNOSIS — M79672 Pain in left foot: Secondary | ICD-10-CM | POA: Diagnosis present

## 2016-08-29 DIAGNOSIS — R2689 Other abnormalities of gait and mobility: Secondary | ICD-10-CM | POA: Diagnosis not present

## 2016-08-29 DIAGNOSIS — G4733 Obstructive sleep apnea (adult) (pediatric): Secondary | ICD-10-CM | POA: Diagnosis present

## 2016-08-29 DIAGNOSIS — Z8249 Family history of ischemic heart disease and other diseases of the circulatory system: Secondary | ICD-10-CM | POA: Diagnosis not present

## 2016-08-29 DIAGNOSIS — Z8673 Personal history of transient ischemic attack (TIA), and cerebral infarction without residual deficits: Secondary | ICD-10-CM | POA: Diagnosis not present

## 2016-08-29 DIAGNOSIS — R0902 Hypoxemia: Secondary | ICD-10-CM | POA: Diagnosis not present

## 2016-08-29 DIAGNOSIS — R279 Unspecified lack of coordination: Secondary | ICD-10-CM | POA: Diagnosis not present

## 2016-08-29 DIAGNOSIS — J9621 Acute and chronic respiratory failure with hypoxia: Secondary | ICD-10-CM | POA: Diagnosis present

## 2016-08-29 DIAGNOSIS — I509 Heart failure, unspecified: Secondary | ICD-10-CM | POA: Diagnosis not present

## 2016-08-29 DIAGNOSIS — Z7902 Long term (current) use of antithrombotics/antiplatelets: Secondary | ICD-10-CM | POA: Diagnosis not present

## 2016-09-03 ENCOUNTER — Ambulatory Visit: Payer: Medicare Other | Admitting: Endocrinology

## 2016-09-03 NOTE — Telephone Encounter (Signed)
This form was filled out SG on 09/02/16.

## 2016-09-06 ENCOUNTER — Encounter (HOSPITAL_BASED_OUTPATIENT_CLINIC_OR_DEPARTMENT_OTHER): Payer: Medicare Other

## 2016-09-07 DIAGNOSIS — R279 Unspecified lack of coordination: Secondary | ICD-10-CM | POA: Diagnosis not present

## 2016-09-07 DIAGNOSIS — R2689 Other abnormalities of gait and mobility: Secondary | ICD-10-CM | POA: Diagnosis not present

## 2016-09-07 DIAGNOSIS — D649 Anemia, unspecified: Secondary | ICD-10-CM | POA: Diagnosis not present

## 2016-09-07 DIAGNOSIS — I130 Hypertensive heart and chronic kidney disease with heart failure and stage 1 through stage 4 chronic kidney disease, or unspecified chronic kidney disease: Secondary | ICD-10-CM | POA: Diagnosis not present

## 2016-09-07 DIAGNOSIS — N189 Chronic kidney disease, unspecified: Secondary | ICD-10-CM | POA: Diagnosis not present

## 2016-09-07 DIAGNOSIS — Z743 Need for continuous supervision: Secondary | ICD-10-CM | POA: Diagnosis not present

## 2016-09-07 DIAGNOSIS — N179 Acute kidney failure, unspecified: Secondary | ICD-10-CM | POA: Diagnosis not present

## 2016-09-07 DIAGNOSIS — I2510 Atherosclerotic heart disease of native coronary artery without angina pectoris: Secondary | ICD-10-CM | POA: Diagnosis not present

## 2016-09-07 DIAGNOSIS — J9611 Chronic respiratory failure with hypoxia: Secondary | ICD-10-CM | POA: Diagnosis not present

## 2016-09-07 DIAGNOSIS — I509 Heart failure, unspecified: Secondary | ICD-10-CM | POA: Diagnosis not present

## 2016-09-07 DIAGNOSIS — G4733 Obstructive sleep apnea (adult) (pediatric): Secondary | ICD-10-CM | POA: Diagnosis not present

## 2016-09-07 DIAGNOSIS — M6281 Muscle weakness (generalized): Secondary | ICD-10-CM | POA: Diagnosis not present

## 2016-09-07 DIAGNOSIS — E1122 Type 2 diabetes mellitus with diabetic chronic kidney disease: Secondary | ICD-10-CM | POA: Diagnosis not present

## 2016-09-11 ENCOUNTER — Encounter (HOSPITAL_COMMUNITY): Payer: Medicare Other

## 2016-09-17 ENCOUNTER — Other Ambulatory Visit (HOSPITAL_COMMUNITY): Payer: Medicare Other

## 2016-10-05 ENCOUNTER — Telehealth: Payer: Self-pay | Admitting: Endocrinology

## 2016-10-05 NOTE — Telephone Encounter (Signed)
Pt's caregiver called in to make Dr. Loanne Drilling aware that Pt has passed.  Marked in his chart.

## 2016-10-05 NOTE — Telephone Encounter (Signed)
Just FYI.

## 2016-10-20 DEATH — deceased

## 2017-12-24 IMAGING — CR DG CHEST 1V PORT
2 series · 2 of 2 positions shown · non-contrast
Comparison: CT chest June 22, 2016

CLINICAL DATA: Shortness of breath.  History of diabetes, CHF.

EXAM:
PORTABLE CHEST 1 VIEW

[AP (1 of 2)]
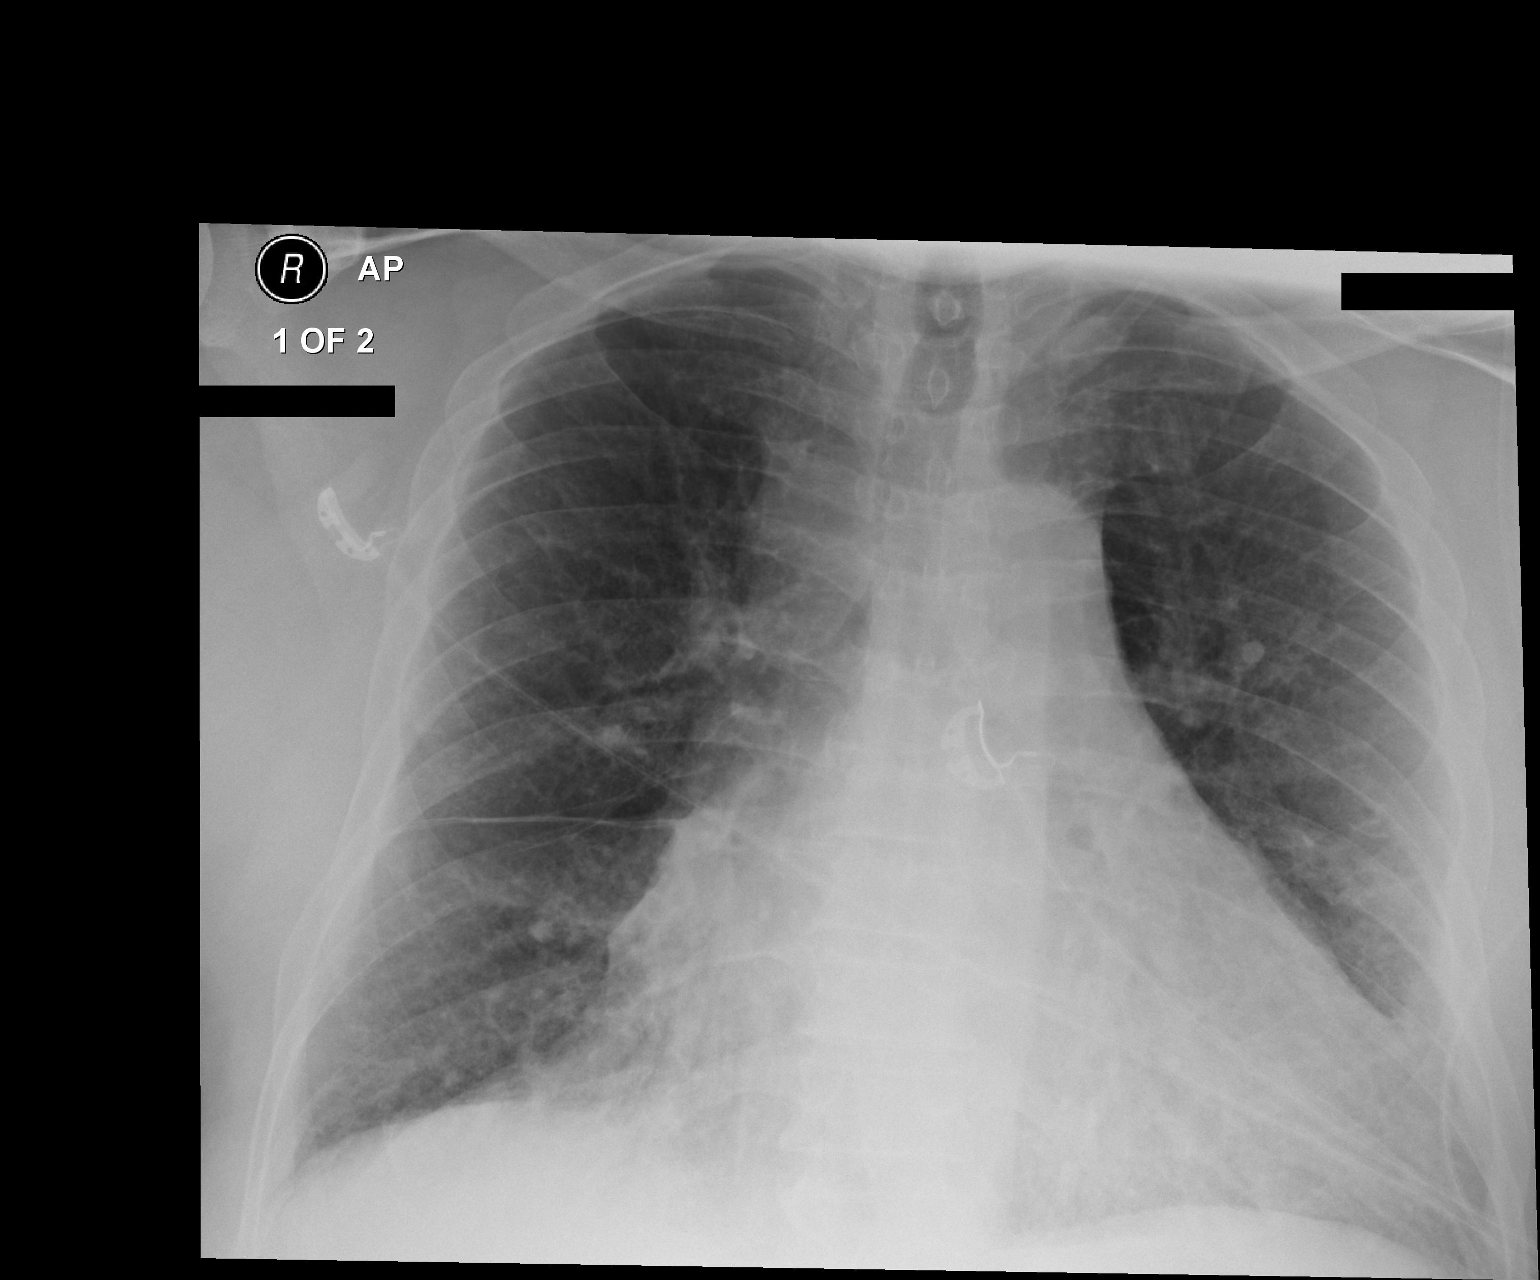

[AP (2 of 2)]
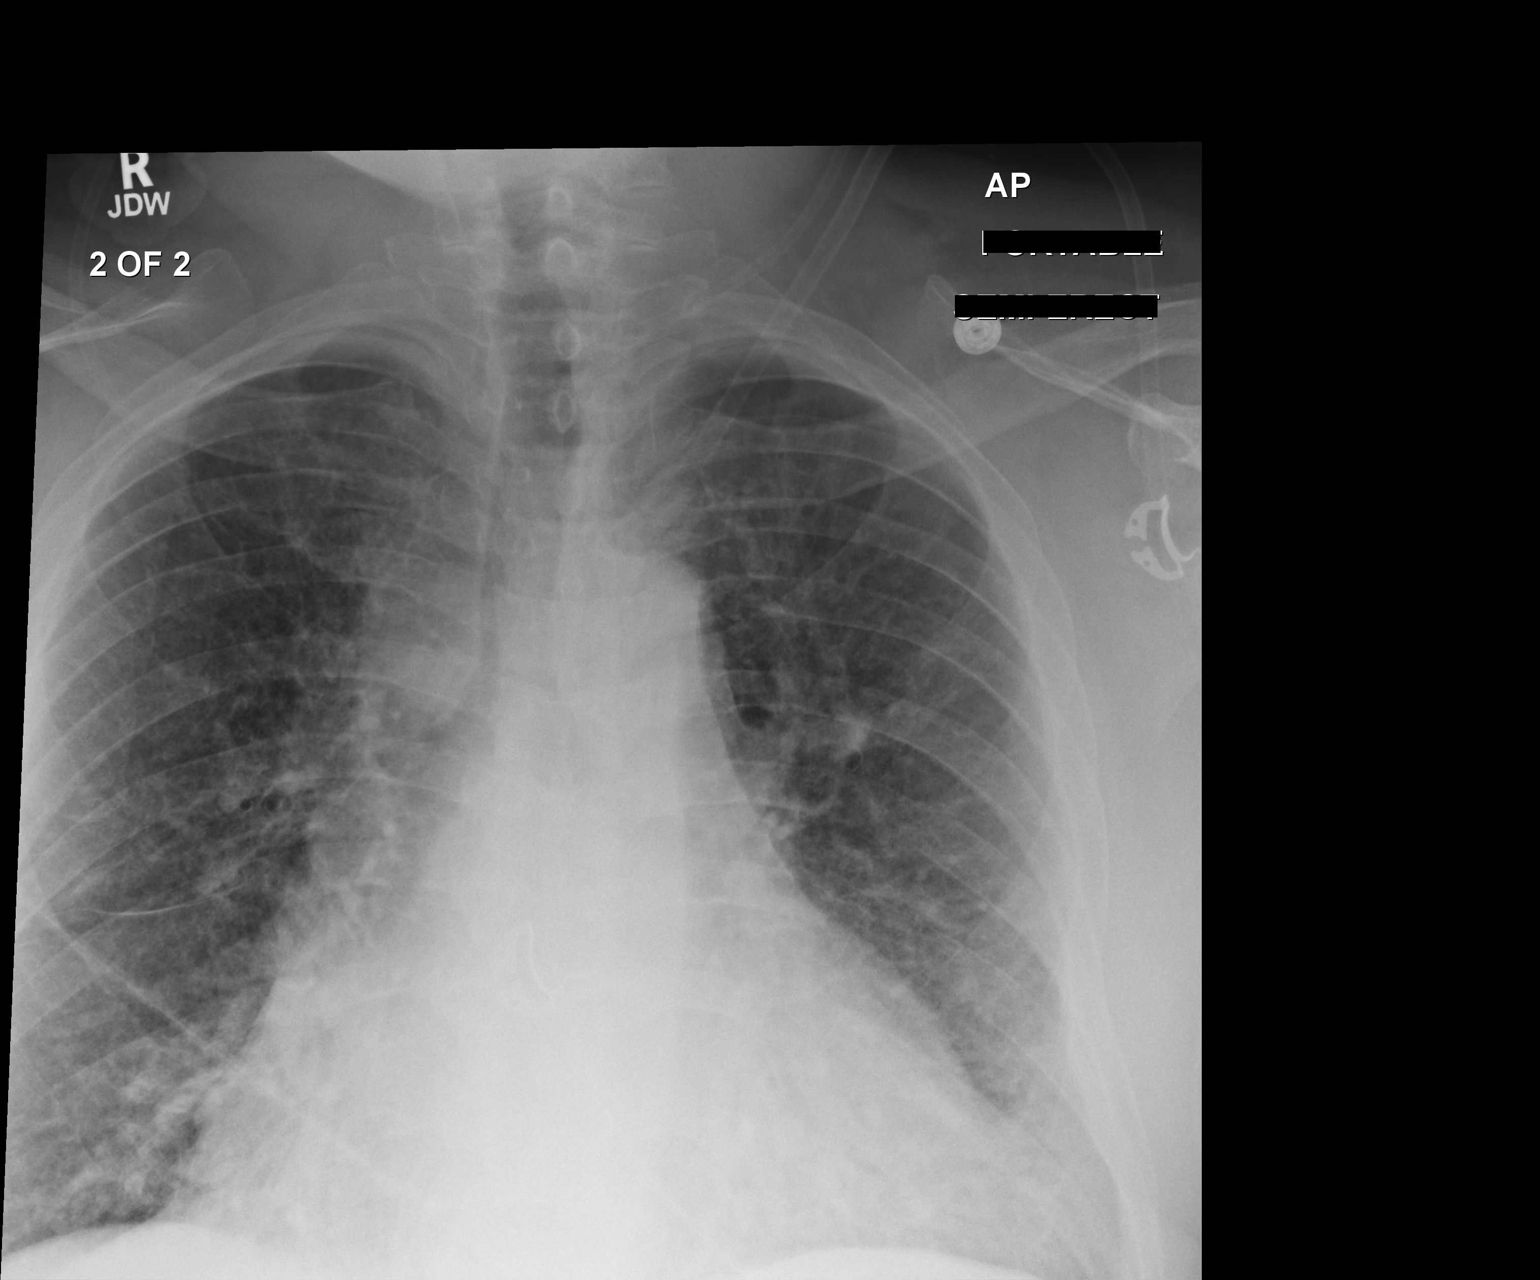

[2 of 2 positions shown; findings below may reference images not displayed]

FINDINGS: Cardiac silhouette is moderately enlarged, unchanged. Mediastinal
silhouette is nonsuspicious. Diffusely coarsened pulmonary
interstitium. Increased lung volumes. No pleural effusion or focal
consolidation. No pneumothorax. Soft tissue planes and included
osseous structures are nonsuspicious.
IMPRESSION: Stable cardiomegaly.

Probable COPD, in addition to diffuse coarsened pulmonary
interstitium better characterized on yesterday's CT chest.

## 2017-12-31 IMAGING — CR DG CHEST 1V PORT
1 series · 1 of 1 positions shown · non-contrast
Comparison: 06/26/2016 and 12/28/2011 chest x-ray. 06/22/2016 chest
CT.

CLINICAL DATA: 65-year-old diabetic hypertensive male with
pulmonary edema and shortness breath. Subsequent encounter.

EXAM:
PORTABLE CHEST 1 VIEW

[AP]
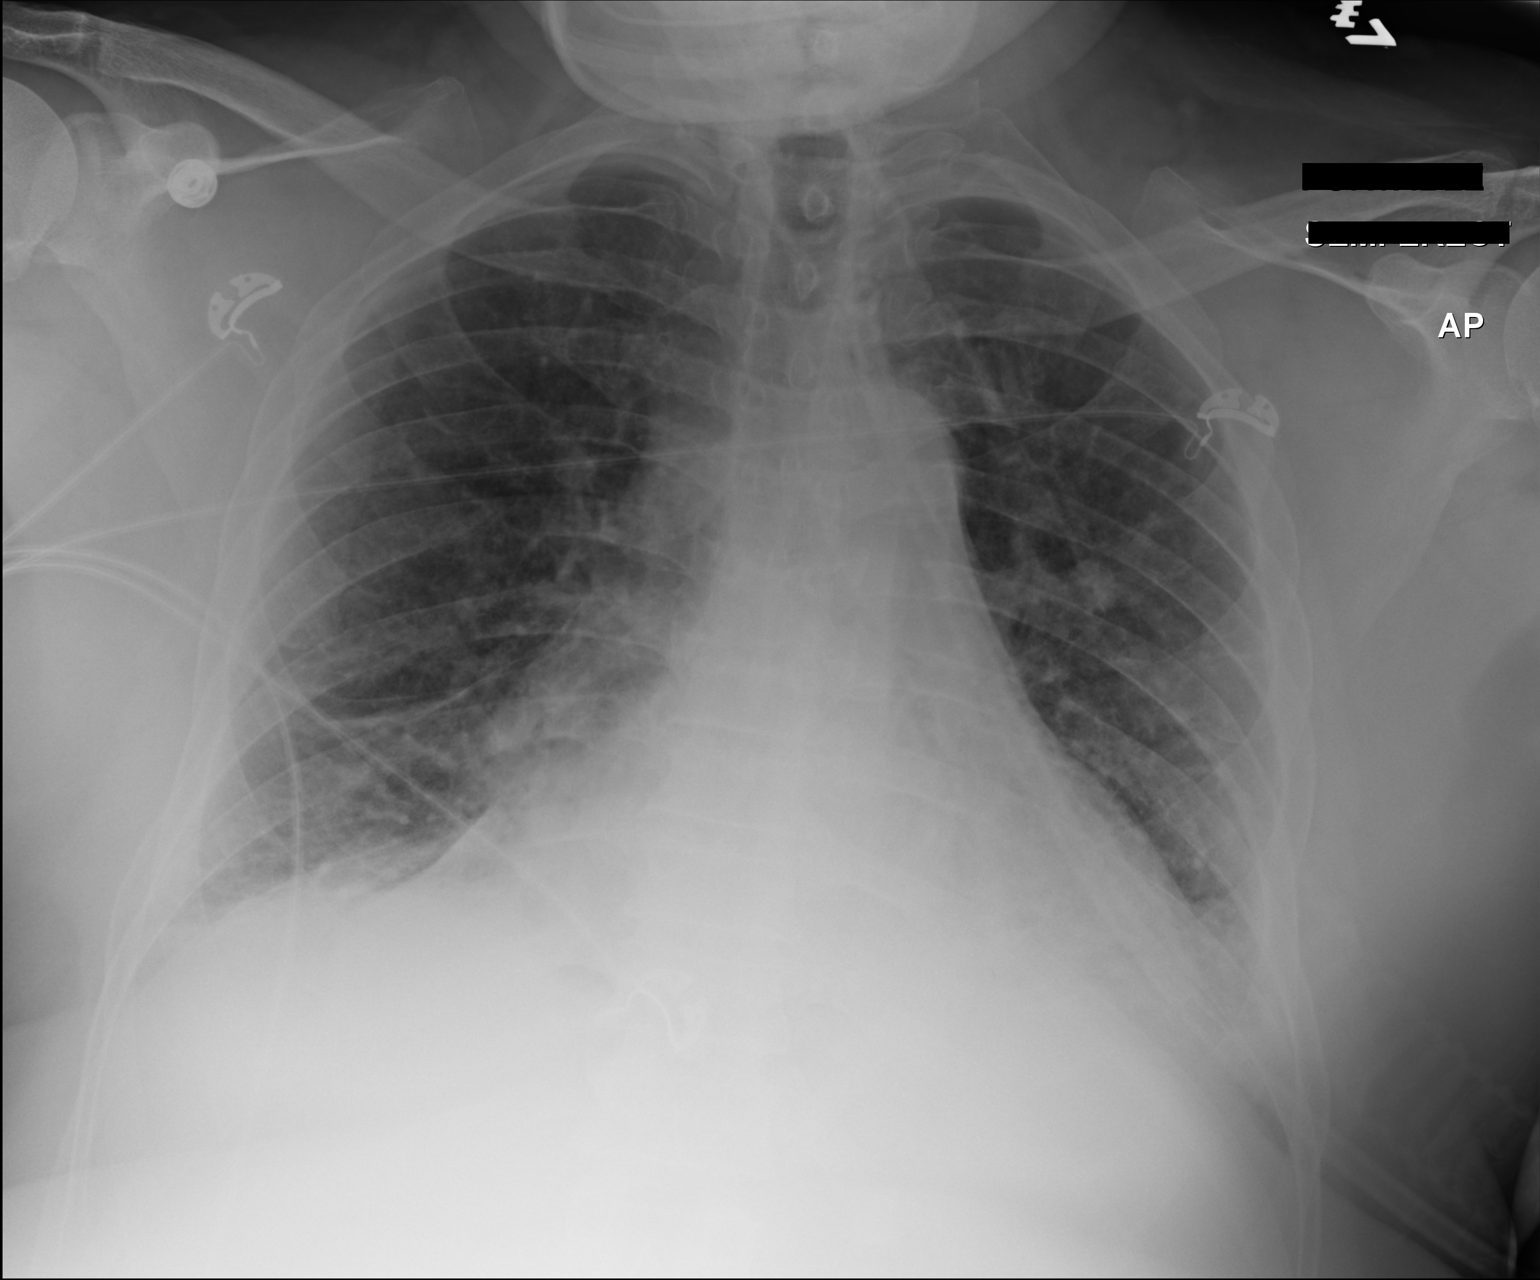

[1 of 1 positions shown; findings below may reference images not displayed]

FINDINGS: Mild pulmonary vascular prominence superimposed upon chronic lung
changes.

Cardiomegaly.

Mediastinal fullness consistent with CT detected adenopathy.

Limited evaluation retrocardiac region.
IMPRESSION: No significant change.

Pulmonary vascular prominence superimposed upon chronic changes.

Cardiomegaly.

Mediastinal adenopathy as best demonstrated on recent chest CT.
Please see CT report.

## 2018-01-01 IMAGING — CR DG CHEST 1V PORT
1 series · 1 of 1 positions shown · non-contrast
Comparison: CT chest 07/01/2016.  Chest x-ray 07/01/2015.

CLINICAL DATA: Shortness of breath.

EXAM:
PORTABLE CHEST 1 VIEW

[AP]
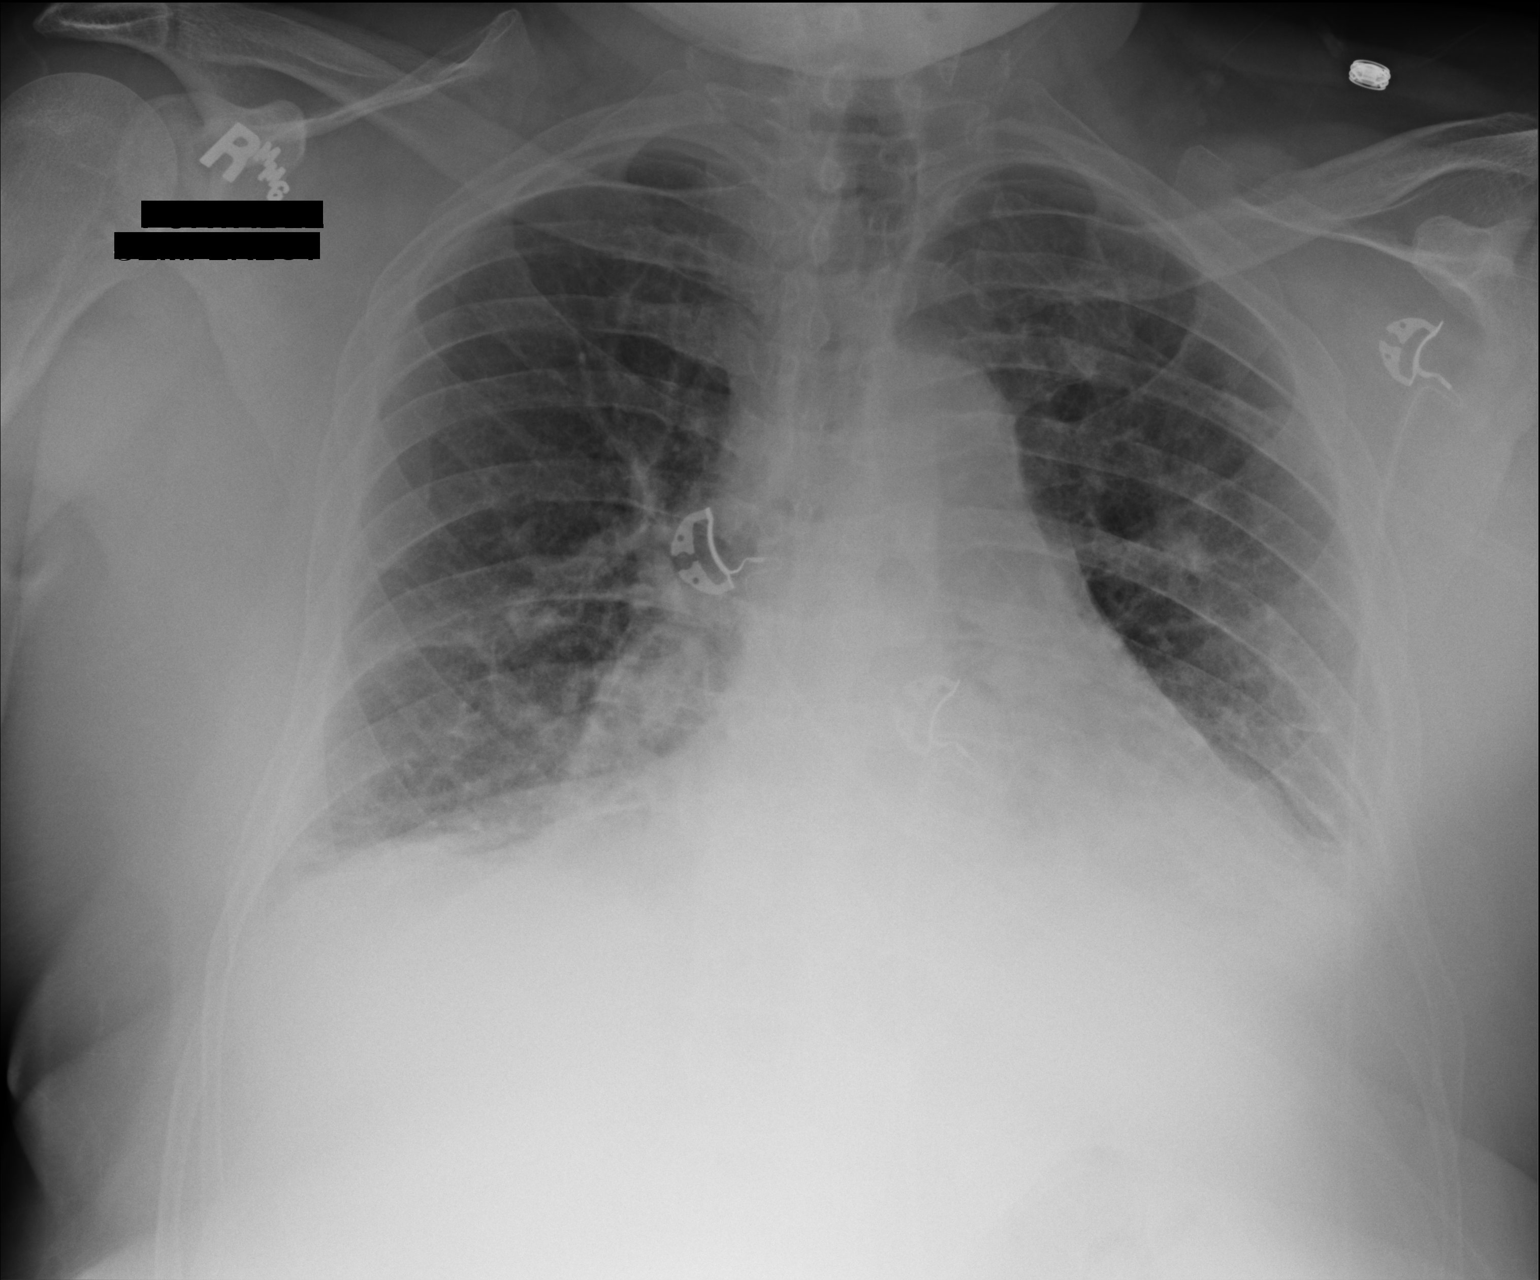

[1 of 1 positions shown; findings below may reference images not displayed]

FINDINGS: Mediastinum and hilar structures are normal. Cardiomegaly with
normal pulmonary vascularity. Bibasilar atelectasis and infiltrates
are present. Small bilateral pleural effusions. No pneumothorax.
IMPRESSION: 1. Persistent bibasilar atelectasis and infiltrates. No significant
change. Small bilateral pleural effusions noted.

2. Stable cardiomegaly.  No pulmonary venous congestion.

## 2018-01-01 IMAGING — CT CT CHEST HIGH RESOLUTION W/O CM
2 of 7 series · 15 of 36 positions shown, 18 images · non-contrast
Comparison: 06/22/2016 and 12/28/2011.

CLINICAL DATA: Shortness of breath.

EXAM:
CT CHEST WITHOUT CONTRAST
TECHNIQUE: Multidetector CT imaging of the chest was performed following the
standard protocol without intravenous contrast. High resolution
imaging of the lungs, as well as inspiratory and expiratory imaging,
was performed.

[Series 7: high resolution 2.0 b30f · axial · 0.74mm/px · z∈[+1220,+1468]mm · 12 of 140 slices shown, 15 images]
[im 8/140  mediastinal]
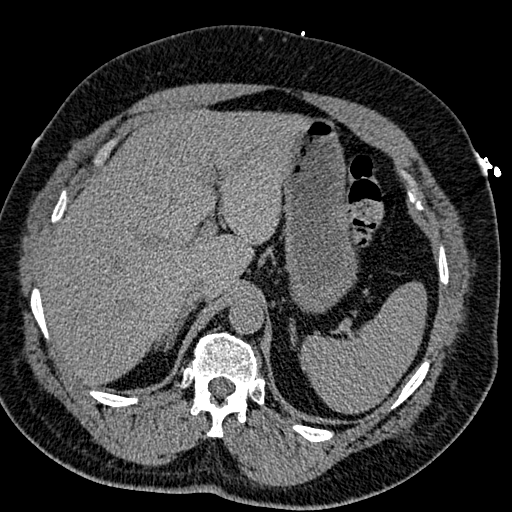
[im 8/140  lung]
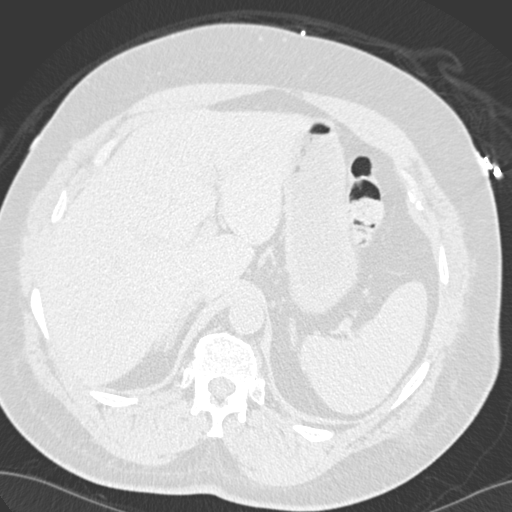
[im 22/140  lung]
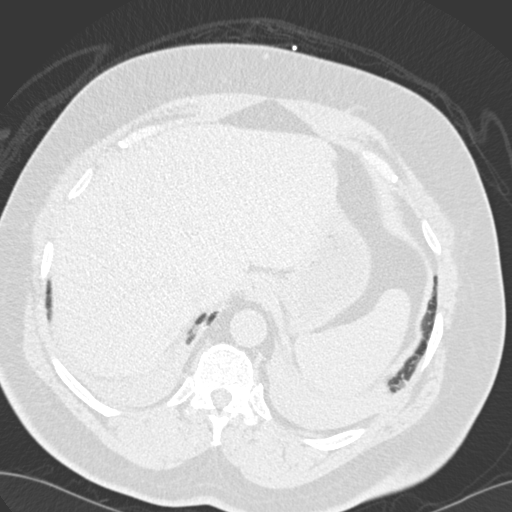
[im 30/140  lung]
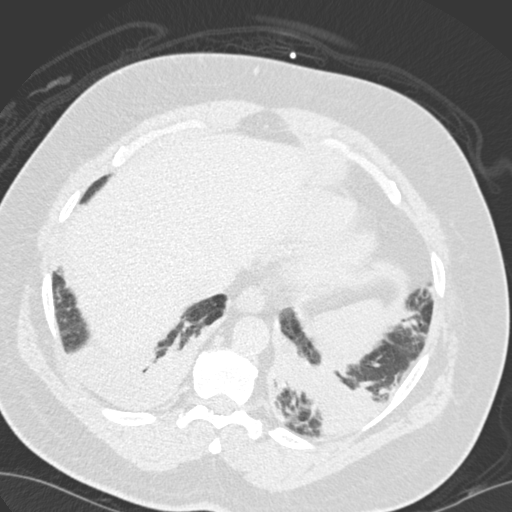
[im 44/140  lung]
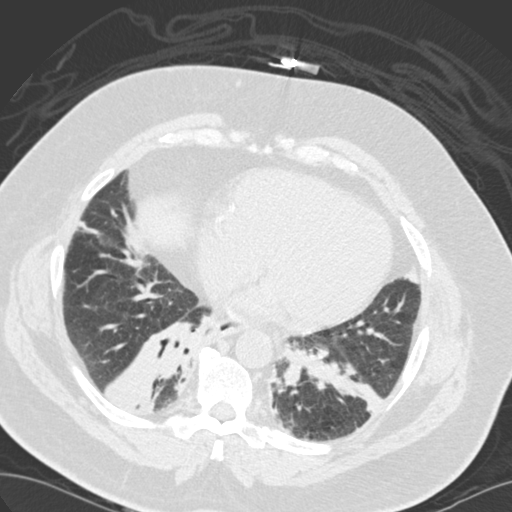
[im 52/140  mediastinal]
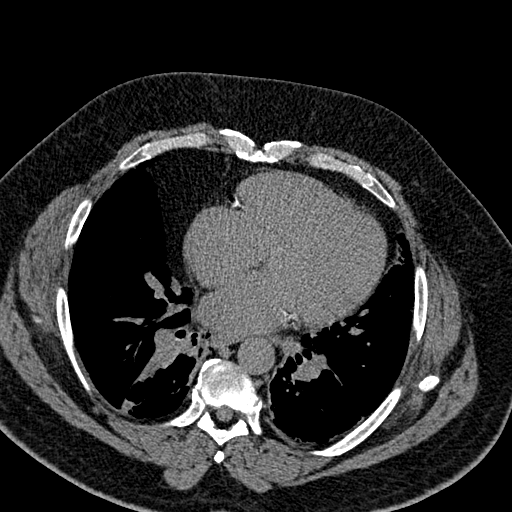
[im 52/140  lung]
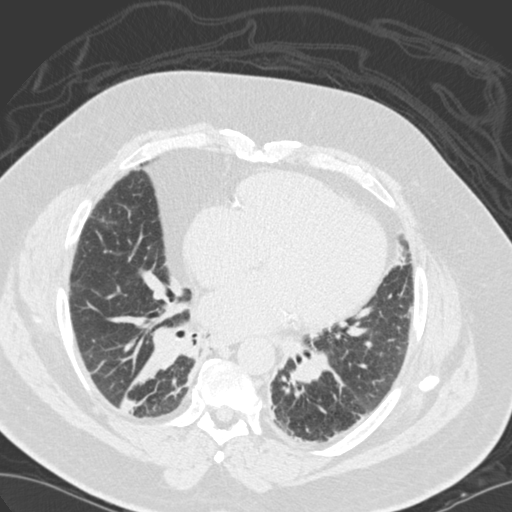
[im 66/140  lung]
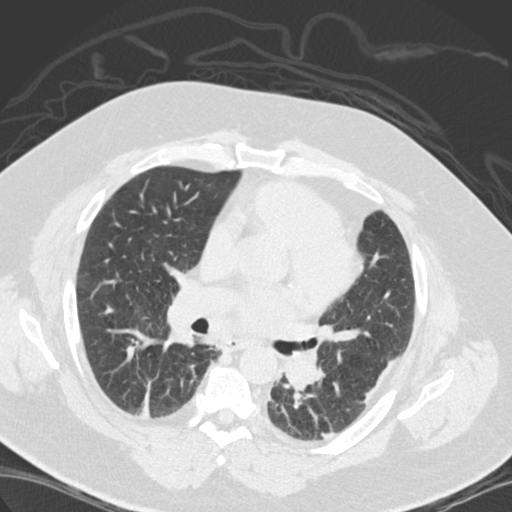
[im 74/140  lung]
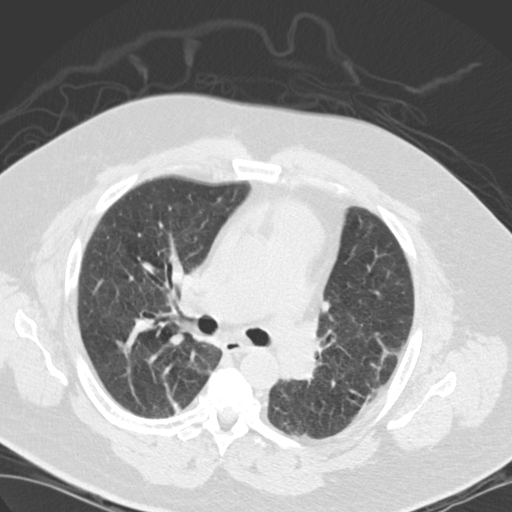
[im 88/140  lung]
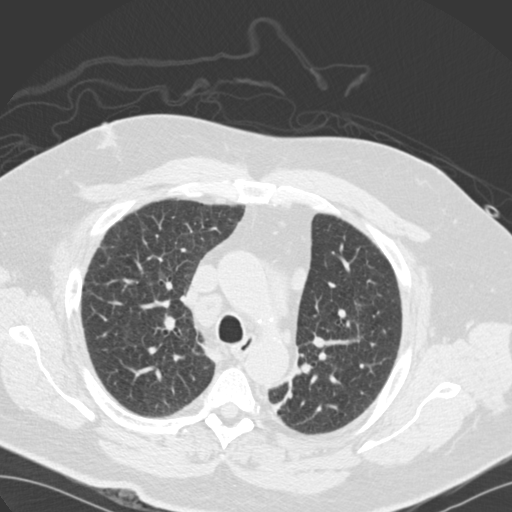
[im 96/140  mediastinal]
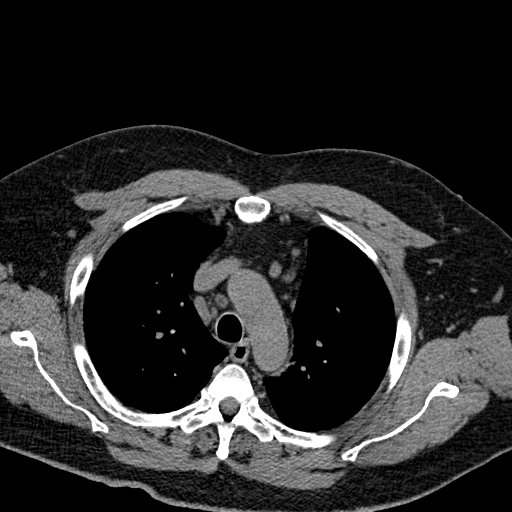
[im 96/140  lung]
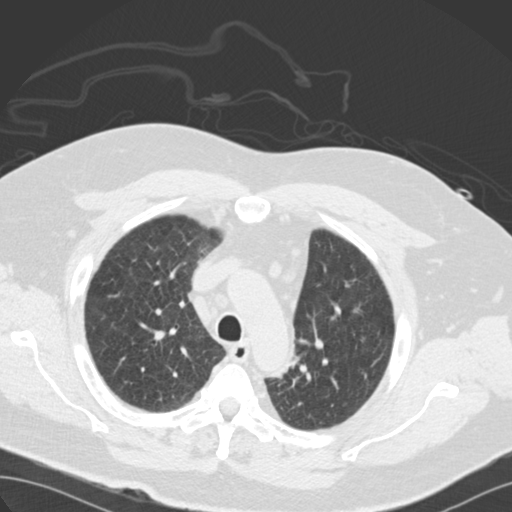
[im 110/140  lung]
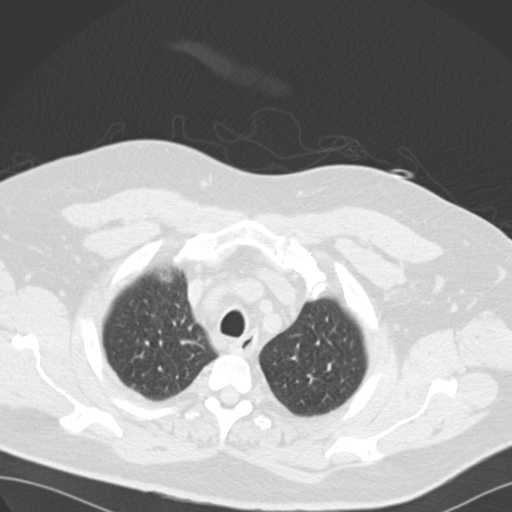
[im 118/140  lung]
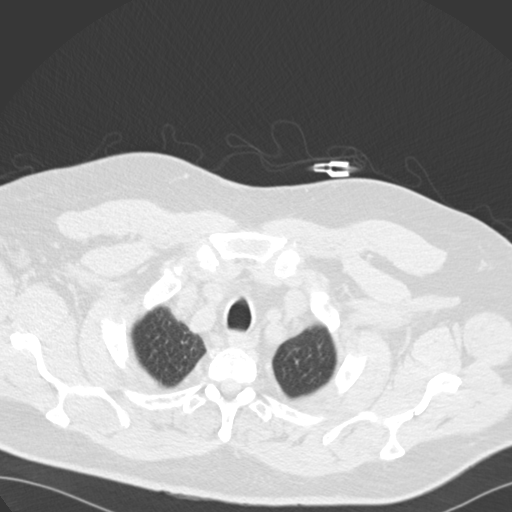
[im 132/140  lung]
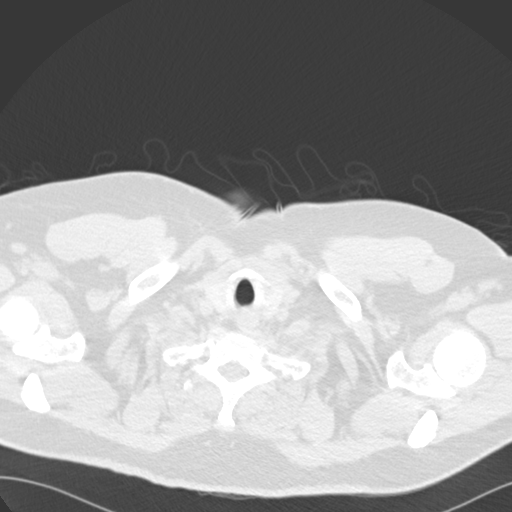

[Series 10: coronal · coronal · 0.59mm/px · 3 of 168 slices shown]
[im 34/168  lung]
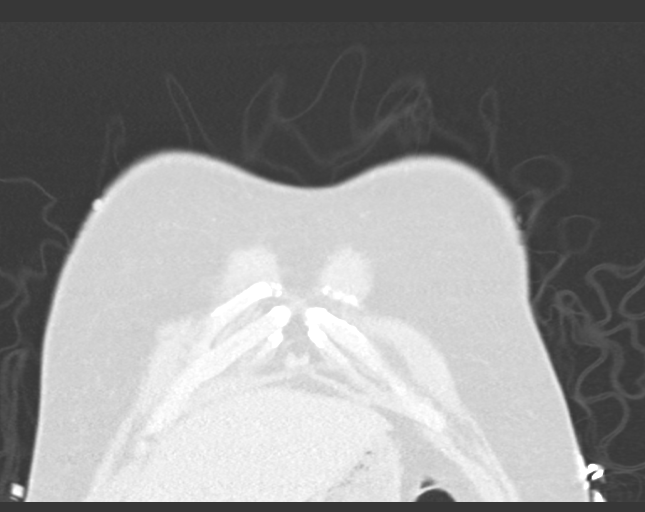
[im 67/168  lung]
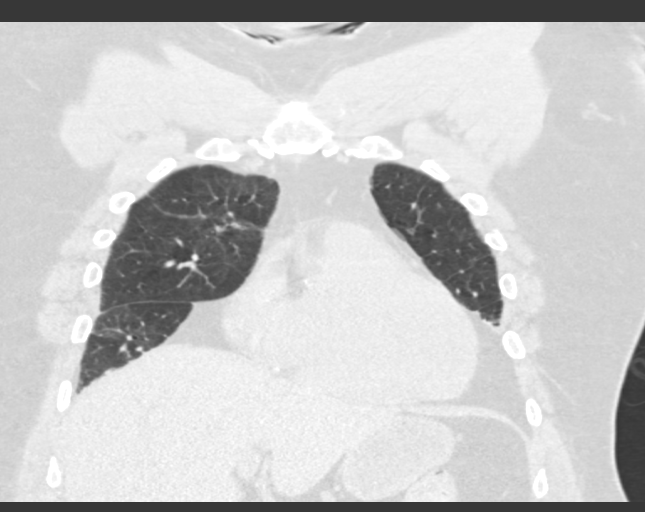
[im 101/168  lung]
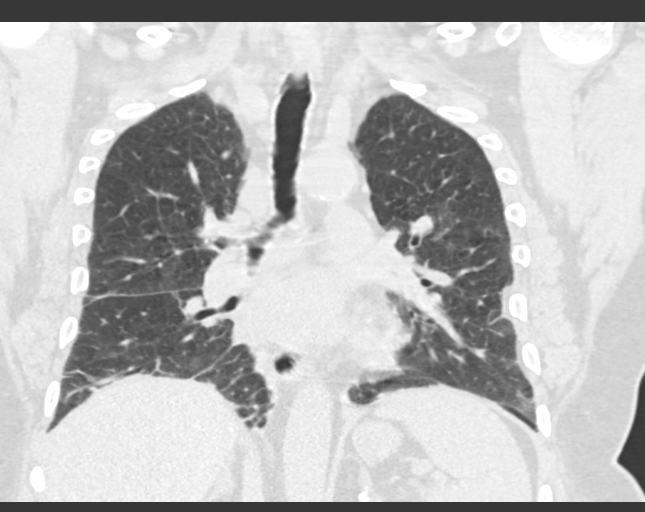

[15 of 36 positions shown; findings below may reference images not displayed]

FINDINGS: Cardiovascular: Atherosclerotic calcification of the arterial
vasculature, including three-vessel involvement of the coronary
arteries. Pulmonary arteries and heart are enlarged. No pericardial
effusion.

Mediastinum/Nodes: Mediastinal lymph nodes are chronically enlarged,
measuring up to 2.2 cm in the low right paratracheal station, stable
from 12/28/2011. Hilar regions are difficult to definitively
evaluate without IV contrast. No axillary adenopathy. Esophagus is
grossly unremarkable.

Lungs/Pleura: Image quality is degraded by respiratory motion.
Collapse/ consolidation in both lower lobes, new from 06/22/2016.
Suspect residual mild septal thickening. No pleural fluid. Airway is
unremarkable.

Upper Abdomen: Visualized portions of the liver and right adrenal
gland are unremarkable. Left adrenal calcification. Visualized
portions of the spleen, stomach and bowel are grossly unremarkable.

Musculoskeletal: No worrisome lytic or sclerotic lesions.
Degenerative changes are seen in the spine.
IMPRESSION: 1. New collapse/consolidation in both lower lobes, indicative of
pneumonia and/or aspiration.
2. Probable residual mild pulmonary edema.
3. Chronic mediastinal adenopathy can be seen in the setting of
congestive heart failure. Sarcoid is another consideration.
4. Enlarged pulmonary arteries, indicative of pulmonary arterial
hypertension.
5. Aortic atherosclerosis ([id number]-170.0). Coronary artery
calcification.
